# Patient Record
Sex: Male | Born: 1955 | Race: Black or African American | Hispanic: No | Marital: Married | Smoking: Former smoker
Health system: Southern US, Community
[De-identification: ages and names within clinical notes are randomized; demographics above are authoritative.]

## PROBLEM LIST (undated history)

## (undated) DIAGNOSIS — I10 Essential (primary) hypertension: Secondary | ICD-10-CM

## (undated) DIAGNOSIS — J45909 Unspecified asthma, uncomplicated: Secondary | ICD-10-CM

## (undated) DIAGNOSIS — M1990 Unspecified osteoarthritis, unspecified site: Secondary | ICD-10-CM

## (undated) DIAGNOSIS — E119 Type 2 diabetes mellitus without complications: Secondary | ICD-10-CM

## (undated) HISTORY — PX: NO PAST SURGERIES: SHX2092

---

## 2019-04-11 ENCOUNTER — Encounter
Admission: RE | Admit: 2019-04-11 | Discharge: 2019-04-11 | Disposition: A | Discharge status: Home / Self Care | Payer: BC Managed Care – PPO | Source: Ambulatory Visit | Attending: Orthopedic Surgery | Admitting: Orthopedic Surgery

## 2019-04-11 ENCOUNTER — Other Ambulatory Visit: Payer: Self-pay

## 2019-04-11 DIAGNOSIS — I10 Essential (primary) hypertension: Secondary | ICD-10-CM | POA: Insufficient documentation

## 2019-04-11 DIAGNOSIS — Z01818 Encounter for other preprocedural examination: Secondary | ICD-10-CM | POA: Insufficient documentation

## 2019-04-11 DIAGNOSIS — E119 Type 2 diabetes mellitus without complications: Secondary | ICD-10-CM | POA: Insufficient documentation

## 2019-04-11 HISTORY — DX: Type 2 diabetes mellitus without complications: E11.9

## 2019-04-11 HISTORY — DX: Unspecified asthma, uncomplicated: J45.909

## 2019-04-11 HISTORY — DX: Unspecified osteoarthritis, unspecified site: M19.90

## 2019-04-11 HISTORY — DX: Essential (primary) hypertension: I10

## 2019-04-11 LAB — BASIC METABOLIC PANEL
Anion gap: 9 (ref 5–15)
BUN: 21 mg/dL (ref 8–23)
CO2: 26 mmol/L (ref 22–32)
Calcium: 9.2 mg/dL (ref 8.9–10.3)
Chloride: 106 mmol/L (ref 98–111)
Creatinine, Ser: 1.03 mg/dL (ref 0.61–1.24)
GFR calc Af Amer: >60 mL/min (ref >60)
GFR calc non Af Amer: >60 mL/min (ref >60)
Glucose, Bld: 104 mg/dL — ABNORMAL HIGH (ref 70–99)
Potassium: 4.4 mmol/L (ref 3.5–5.1)
Sodium: 141 mmol/L (ref 135–145)

## 2019-04-11 LAB — URINALYSIS, ROUTINE W REFLEX MICROSCOPIC
Bilirubin Urine: NEGATIVE
Glucose, UA: NEGATIVE mg/dL
Hgb urine dipstick: NEGATIVE
Ketones, ur: NEGATIVE mg/dL
Leukocytes,Ua: NEGATIVE
Nitrite: NEGATIVE
Protein, ur: NEGATIVE mg/dL
Specific Gravity, Urine: 1.029 (ref 1.005–1.030)
pH: 5 (ref 5.0–8.0)

## 2019-04-11 LAB — SURGICAL PCR SCREEN
MRSA, PCR: NEGATIVE
Staphylococcus aureus: NEGATIVE

## 2019-04-11 LAB — CBC
HCT: 40.5 % (ref 39.0–52.0)
Hemoglobin: 13.5 g/dL (ref 13.0–17.0)
MCH: 28.7 pg (ref 26.0–34.0)
MCHC: 33.3 g/dL (ref 30.0–36.0)
MCV: 86.2 fL (ref 80.0–100.0)
Platelets: 175 10*3/uL (ref 150–400)
RBC: 4.7 MIL/uL (ref 4.22–5.81)
RDW: 12.4 % (ref 11.5–15.5)
WBC: 5.8 10*3/uL (ref 4.0–10.5)

## 2019-04-11 LAB — APTT: aPTT: 26 seconds (ref 24–36)

## 2019-04-11 LAB — SEDIMENTATION RATE: Sed Rate: 15 mm/hr (ref 0–20)

## 2019-04-11 LAB — TYPE AND SCREEN
ABO/RH(D): O POS
Antibody Screen: NEGATIVE

## 2019-04-11 LAB — PROTIME-INR
INR: 1 (ref 0.8–1.2)
Prothrombin Time: 12.9 seconds (ref 11.4–15.2)

## 2019-04-11 NOTE — Pre-Procedure Instructions (Signed)
ECG 12-lead2/20/2019 [place] Component Name Value Ref Range  Vent Rate (bpm) 103   PR Interval (msec) 150   QRS Interval (msec) 88   QT Interval (msec) 340   QTc (msec) 445   Other Result Information  This result has an attachment that is not available.  Result Narrative  Sinus tachycardia Possible Left atrial enlargement Anterior infarct , age undetermined Abnormal ECG No previous ECGs available I reviewed and concur with this report. Electronically signed [id number] MD, JASMINE [phone]) on 09/10/2017 6:55:20 PM  Status Results Details   Encounter Summary

## 2019-04-11 NOTE — Patient Instructions (Signed)
Your procedure is scheduled on: 04-19-19 TUESDAY Report to Same Day Surgery 2nd floor medical mall [facility] Entrance-take elevator on left to 2nd floor.  Check in with surgery information desk.) To find out your arrival time please call [phone] between 1PM - 3PM on 04-18-19 MONDAY  Remember: Instructions that are not followed completely may result in serious medical risk, up to and including death, or upon the discretion of your surgeon and anesthesiologist your surgery may need to be rescheduled.    _x___ 1. Do not eat food after midnight the night before your procedure. NO GUM OR CANDY AFTER MIDNIGHT. You may drink WATER up to 2 hours before you are scheduled to arrive at the hospital for your procedure.  Do not drink WATER within 2 hours of your scheduled arrival to the hospital.  Type 1 and type 2 diabetics should only drink water.   ____Ensure clear carbohydrate drink on the way to the hospital for bariatric patients  ____Ensure clear carbohydrate drink 3 hours before surgery.     __x__ 2. No Alcohol for 24 hours before or after surgery.   __x__3. No Smoking or e-cigarettes for 24 prior to surgery.  Do not use any chewable tobacco products for at least 6 hour prior to surgery   ____  4. Bring all medications with you on the day of surgery if instructed.    __x__ 5. Notify your doctor if there is any change in your medical condition     (cold, fever, infections).    x___6. On the morning of surgery brush your teeth with toothpaste and water.  You may rinse your mouth with mouth wash if you wish.  Do not swallow any toothpaste or mouthwash.   Do not wear jewelry, make-up, hairpins, clips or nail polish.  Do not wear lotions, powders, or perfumes.   Do not shave 48 hours prior to surgery. Men may shave face and neck.  Do not bring valuables to the hospital.    [facility] is not responsible for any belongings or valuables.               Contacts, dentures or bridgework  may not be worn into surgery.  Leave your suitcase in the car. After surgery it may be brought to your room.  For patients admitted to the hospital, discharge time is determined by your treatment team.  _  Patients discharged the day of surgery will not be allowed to drive home.  You will need someone to drive you home and stay with you the night of your procedure.    Please read over the following fact sheets that you were given:   [facility] Preparing for Surgery and or MRSA Information   _x___ TAKE THE FOLLOWING MEDICATION THE MORNING OF SURGERY WITH A SMALL SIP OF WATER. These include:  1. NORVASC (AMLODIPINE)  2. METOPROLOL  3.  4.  5.  6.  ____Fleets enema or Magnesium Citrate as directed.   _x___ Use CHG Soap or sage wipes as directed on instruction sheet   ____ Use inhalers on the day of surgery and bring to hospital day of surgery  _X___ Stop Metformin 2 days prior to surgery-LAST DOSE ON Saturday OCTOBER 3RD   ____ Take 1/2 of usual insulin dose the night before surgery and none on the morning surgery.   ____ Follow recommendations from Cardiologist, Pulmonologist or PCP regarding stopping Aspirin, Coumadin, Plavix ,Eliquis, Effient, or Pradaxa, and Pletal.  X____Stop Anti-inflammatories such as  Advil, Aleve, Ibuprofen, Motrin, Naproxen, Naprosyn, Goodies powders or aspirin products NOW-OK to take Tylenol    ____ Stop supplements until after surgery   ____ Bring C-Pap to the hospital.

## 2019-04-12 LAB — URINE CULTURE: Culture: NO GROWTH

## 2019-04-15 ENCOUNTER — Other Ambulatory Visit
Admission: RE | Admit: 2019-04-15 | Discharge: 2019-04-15 | Disposition: A | Discharge status: Home / Self Care | Payer: BC Managed Care – PPO | Source: Ambulatory Visit | Attending: Orthopedic Surgery | Admitting: Orthopedic Surgery

## 2019-04-15 ENCOUNTER — Other Ambulatory Visit: Payer: Self-pay

## 2019-04-15 DIAGNOSIS — Z01812 Encounter for preprocedural laboratory examination: Secondary | ICD-10-CM | POA: Diagnosis not present

## 2019-04-15 DIAGNOSIS — Z20828 Contact with and (suspected) exposure to other viral communicable diseases: Secondary | ICD-10-CM | POA: Insufficient documentation

## 2019-04-15 LAB — SARS CORONAVIRUS 2 (TAT 6-24 HRS): SARS Coronavirus 2: NEGATIVE

## 2019-04-19 ENCOUNTER — Inpatient Hospital Stay: Payer: BC Managed Care – PPO | Admitting: Certified Registered"

## 2019-04-19 ENCOUNTER — Other Ambulatory Visit: Payer: Self-pay

## 2019-04-19 ENCOUNTER — Inpatient Hospital Stay
Admission: RE | Admit: 2019-04-19 | Discharge: 2019-04-20 | DRG: 470 | Disposition: A | Discharge status: Home / Self Care | Payer: BC Managed Care – PPO | Attending: Orthopedic Surgery | Admitting: Orthopedic Surgery

## 2019-04-19 ENCOUNTER — Inpatient Hospital Stay: Payer: BC Managed Care – PPO

## 2019-04-19 ENCOUNTER — Encounter
Admission: RE | Disposition: A | Discharge status: Home / Self Care | Payer: Self-pay | Source: Home / Self Care | Attending: Orthopedic Surgery

## 2019-04-19 DIAGNOSIS — Z8249 Family history of ischemic heart disease and other diseases of the circulatory system: Secondary | ICD-10-CM

## 2019-04-19 DIAGNOSIS — Z7984 Long term (current) use of oral hypoglycemic drugs: Secondary | ICD-10-CM

## 2019-04-19 DIAGNOSIS — I10 Essential (primary) hypertension: Secondary | ICD-10-CM | POA: Diagnosis present

## 2019-04-19 DIAGNOSIS — Z419 Encounter for procedure for purposes other than remedying health state, unspecified: Secondary | ICD-10-CM

## 2019-04-19 DIAGNOSIS — K08109 Complete loss of teeth, unspecified cause, unspecified class: Secondary | ICD-10-CM | POA: Diagnosis present

## 2019-04-19 DIAGNOSIS — Z972 Presence of dental prosthetic device (complete) (partial): Secondary | ICD-10-CM

## 2019-04-19 DIAGNOSIS — Z833 Family history of diabetes mellitus: Secondary | ICD-10-CM | POA: Diagnosis not present

## 2019-04-19 DIAGNOSIS — E119 Type 2 diabetes mellitus without complications: Secondary | ICD-10-CM | POA: Diagnosis present

## 2019-04-19 DIAGNOSIS — M1612 Unilateral primary osteoarthritis, left hip: Principal | ICD-10-CM | POA: Diagnosis present

## 2019-04-19 DIAGNOSIS — M1990 Unspecified osteoarthritis, unspecified site: Secondary | ICD-10-CM | POA: Diagnosis present

## 2019-04-19 DIAGNOSIS — Z79899 Other long term (current) drug therapy: Secondary | ICD-10-CM

## 2019-04-19 DIAGNOSIS — Z91048 Other nonmedicinal substance allergy status: Secondary | ICD-10-CM

## 2019-04-19 DIAGNOSIS — Z91030 Bee allergy status: Secondary | ICD-10-CM

## 2019-04-19 DIAGNOSIS — Z96642 Presence of left artificial hip joint: Secondary | ICD-10-CM

## 2019-04-19 DIAGNOSIS — G8918 Other acute postprocedural pain: Secondary | ICD-10-CM

## 2019-04-19 HISTORY — PX: TOTAL HIP ARTHROPLASTY: SHX124

## 2019-04-19 LAB — CBC
HCT: 38.3 % — ABNORMAL LOW (ref 39.0–52.0)
Hemoglobin: 12.7 g/dL — ABNORMAL LOW (ref 13.0–17.0)
MCH: 28.5 pg (ref 26.0–34.0)
MCHC: 33.2 g/dL (ref 30.0–36.0)
MCV: 86.1 fL (ref 80.0–100.0)
Platelets: 161 10*3/uL (ref 150–400)
RBC: 4.45 MIL/uL (ref 4.22–5.81)
RDW: 12.6 % (ref 11.5–15.5)
WBC: 4.7 10*3/uL (ref 4.0–10.5)
nRBC: 0 % (ref 0.0–0.2)

## 2019-04-19 LAB — CREATININE, SERUM
Creatinine, Ser: 0.85 mg/dL (ref 0.61–1.24)
GFR calc Af Amer: >60 mL/min (ref >60)
GFR calc non Af Amer: >60 mL/min (ref >60)

## 2019-04-19 LAB — GLUCOSE, CAPILLARY
Glucose-Capillary: 121 mg/dL — ABNORMAL HIGH (ref 70–99)
Glucose-Capillary: 160 mg/dL — ABNORMAL HIGH (ref 70–99)
Glucose-Capillary: 129 mg/dL — ABNORMAL HIGH (ref 70–99)
Glucose-Capillary: 171 mg/dL — ABNORMAL HIGH (ref 70–99)
Glucose-Capillary: 160 mg/dL — ABNORMAL HIGH (ref 70–99)

## 2019-04-19 LAB — HEMOGLOBIN A1C
Hgb A1c MFr Bld: 6.8 % — ABNORMAL HIGH (ref 4.8–5.6)
Mean Plasma Glucose: 148.46 mg/dL

## 2019-04-19 LAB — ABO/RH: ABO/RH(D): O POS

## 2019-04-19 SURGERY — ARTHROPLASTY, HIP, TOTAL, ANTERIOR APPROACH
Anesthesia: Spinal | Site: Hip | Laterality: Left

## 2019-04-19 MED ORDER — GLIMEPIRIDE 4 MG PO TABS
4.0000 mg | ORAL_TABLET | Freq: Two times a day (BID) | ORAL | Status: DC
  Administered 2019-04-19 – 2019-04-20 (×2): 4 mg via ORAL
  Filled 2019-04-19 (×3): qty 1

## 2019-04-19 MED ORDER — BUPIVACAINE HCL (PF) 0.5 % IJ SOLN
INTRAMUSCULAR | Status: AC
  Filled 2019-04-19: qty 10

## 2019-04-19 MED ORDER — SODIUM CHLORIDE 0.9 % IJ SOLN
INTRAMUSCULAR | Status: DC | PRN
  Administered 2019-04-19: 40 mL

## 2019-04-19 MED ORDER — METHOCARBAMOL 500 MG PO TABS: 500.0000 mg | ORAL_TABLET | Freq: Four times a day (QID) | ORAL | Status: DC | PRN

## 2019-04-19 MED ORDER — ONDANSETRON HCL 4 MG PO TABS: 4.0000 mg | ORAL_TABLET | Freq: Four times a day (QID) | ORAL | Status: DC | PRN

## 2019-04-19 MED ORDER — SODIUM CHLORIDE FLUSH 0.9 % IV SOLN
INTRAVENOUS | Status: AC
  Filled 2019-04-19: qty 40

## 2019-04-19 MED ORDER — PROPOFOL 500 MG/50ML IV EMUL
INTRAVENOUS | Status: DC | PRN
  Administered 2019-04-19: 50 ug/kg/min via INTRAVENOUS

## 2019-04-19 MED ORDER — METFORMIN HCL 500 MG PO TABS
1000.0000 mg | ORAL_TABLET | Freq: Two times a day (BID) | ORAL | Status: DC
  Administered 2019-04-19 – 2019-04-20 (×2): 1000 mg via ORAL
  Filled 2019-04-19 (×2): qty 2

## 2019-04-19 MED ORDER — PANTOPRAZOLE SODIUM 40 MG PO TBEC
40.0000 mg | DELAYED_RELEASE_TABLET | Freq: Every day | ORAL | Status: DC
  Administered 2019-04-19 – 2019-04-20 (×2): 40 mg via ORAL
  Filled 2019-04-19 (×2): qty 1

## 2019-04-19 MED ORDER — MAGNESIUM CITRATE PO SOLN
1.0000 | Freq: Once | ORAL | Status: DC | PRN
  Filled 2019-04-19: qty 296

## 2019-04-19 MED ORDER — ALUM & MAG HYDROXIDE-SIMETH 200-200-20 MG/5ML PO SUSP: 30.0000 mL | ORAL | Status: DC | PRN

## 2019-04-19 MED ORDER — FENTANYL CITRATE (PF) 100 MCG/2ML IJ SOLN: 25.0000 ug | INTRAMUSCULAR | Status: DC | PRN

## 2019-04-19 MED ORDER — INSULIN ASPART 100 UNIT/ML ~~LOC~~ SOLN
0.0000 [IU] | Freq: Three times a day (TID) | SUBCUTANEOUS | Status: DC
  Administered 2019-04-19: 2 [IU] via SUBCUTANEOUS
  Administered 2019-04-19 – 2019-04-20 (×3): 3 [IU] via SUBCUTANEOUS
  Filled 2019-04-19 (×4): qty 1

## 2019-04-19 MED ORDER — BUPIVACAINE-EPINEPHRINE 0.25% -1:200000 IJ SOLN
INTRAMUSCULAR | Status: DC | PRN
  Administered 2019-04-19: 30 mL

## 2019-04-19 MED ORDER — SODIUM CHLORIDE 0.9 % IV SOLN
INTRAVENOUS | Status: DC | PRN
  Administered 2019-04-19: 10:00:00 60 mL

## 2019-04-19 MED ORDER — LIDOCAINE HCL (PF) 2 % IJ SOLN
INTRAMUSCULAR | Status: DC | PRN
  Administered 2019-04-19: 50 mg

## 2019-04-19 MED ORDER — METOPROLOL SUCCINATE ER 25 MG PO TB24
25.0000 mg | ORAL_TABLET | ORAL | Status: DC
  Administered 2019-04-19 – 2019-04-20 (×2): 25 mg via ORAL
  Filled 2019-04-19 (×2): qty 1

## 2019-04-19 MED ORDER — LISINOPRIL 20 MG PO TABS
40.0000 mg | ORAL_TABLET | ORAL | Status: DC
  Administered 2019-04-19 – 2019-04-20 (×2): 40 mg via ORAL
  Filled 2019-04-19 (×2): qty 2

## 2019-04-19 MED ORDER — GLYCOPYRROLATE 0.2 MG/ML IJ SOLN
INTRAMUSCULAR | Status: AC
  Filled 2019-04-19: qty 1

## 2019-04-19 MED ORDER — DOCUSATE SODIUM 100 MG PO CAPS
100.0000 mg | ORAL_CAPSULE | Freq: Two times a day (BID) | ORAL | Status: DC
  Administered 2019-04-19 – 2019-04-20 (×3): 100 mg via ORAL
  Filled 2019-04-19 (×3): qty 1

## 2019-04-19 MED ORDER — CEFAZOLIN SODIUM-DEXTROSE 2-4 GM/100ML-% IV SOLN
2.0000 g | Freq: Once | INTRAVENOUS | Status: AC
  Administered 2019-04-19: 2 g via INTRAVENOUS

## 2019-04-19 MED ORDER — ONDANSETRON HCL 4 MG/2ML IJ SOLN: 4.0000 mg | Freq: Four times a day (QID) | INTRAMUSCULAR | Status: DC | PRN

## 2019-04-19 MED ORDER — FAMOTIDINE 20 MG PO TABS
ORAL_TABLET | ORAL | Status: AC
  Administered 2019-04-19: 20 mg via ORAL
  Filled 2019-04-19: qty 1

## 2019-04-19 MED ORDER — MIDAZOLAM HCL 5 MG/5ML IJ SOLN
INTRAMUSCULAR | Status: DC | PRN
  Administered 2019-04-19: 2 mg via INTRAVENOUS

## 2019-04-19 MED ORDER — MENTHOL 3 MG MT LOZG
1.0000 | LOZENGE | OROMUCOSAL | Status: DC | PRN
  Filled 2019-04-19: qty 9

## 2019-04-19 MED ORDER — AMLODIPINE BESYLATE 10 MG PO TABS
10.0000 mg | ORAL_TABLET | ORAL | Status: DC
  Administered 2019-04-19 – 2019-04-20 (×2): 10 mg via ORAL
  Filled 2019-04-19 (×2): qty 1

## 2019-04-19 MED ORDER — FENTANYL CITRATE (PF) 100 MCG/2ML IJ SOLN
INTRAMUSCULAR | Status: DC | PRN
  Administered 2019-04-19 (×2): 50 ug via INTRAVENOUS

## 2019-04-19 MED ORDER — MORPHINE SULFATE (PF) 2 MG/ML IV SOLN
0.5000 mg | INTRAVENOUS | Status: DC | PRN
  Administered 2019-04-19: 1 mg via INTRAVENOUS
  Filled 2019-04-19: qty 1

## 2019-04-19 MED ORDER — DIPHENHYDRAMINE HCL 12.5 MG/5ML PO ELIX: 12.5000 mg | ORAL_SOLUTION | ORAL | Status: DC | PRN

## 2019-04-19 MED ORDER — HYDROCODONE-ACETAMINOPHEN 7.5-325 MG PO TABS
1.0000 | ORAL_TABLET | ORAL | Status: DC | PRN
  Administered 2019-04-19 – 2019-04-20 (×2): 1 via ORAL
  Filled 2019-04-19 (×2): qty 1

## 2019-04-19 MED ORDER — PROPOFOL 10 MG/ML IV BOLUS
INTRAVENOUS | Status: AC
  Filled 2019-04-19: qty 20

## 2019-04-19 MED ORDER — FENTANYL CITRATE (PF) 100 MCG/2ML IJ SOLN
INTRAMUSCULAR | Status: AC
  Filled 2019-04-19: qty 2

## 2019-04-19 MED ORDER — BUPIVACAINE LIPOSOME 1.3 % IJ SUSP
INTRAMUSCULAR | Status: AC
  Filled 2019-04-19: qty 20

## 2019-04-19 MED ORDER — TRANEXAMIC ACID-NACL 1000-0.7 MG/100ML-% IV SOLN
1000.0000 mg | INTRAVENOUS | Status: AC
  Administered 2019-04-19: 1000 mg via INTRAVENOUS
  Filled 2019-04-19: qty 100

## 2019-04-19 MED ORDER — ENOXAPARIN SODIUM 40 MG/0.4ML ~~LOC~~ SOLN
40.0000 mg | SUBCUTANEOUS | Status: DC
  Administered 2019-04-20: 40 mg via SUBCUTANEOUS
  Filled 2019-04-19: qty 0.4

## 2019-04-19 MED ORDER — BUPIVACAINE HCL (PF) 0.25 % IJ SOLN
INTRAMUSCULAR | Status: AC
  Filled 2019-04-19: qty 30

## 2019-04-19 MED ORDER — ACETAMINOPHEN 325 MG PO TABS: 325.0000 mg | ORAL_TABLET | Freq: Four times a day (QID) | ORAL | Status: DC | PRN

## 2019-04-19 MED ORDER — ADULT MULTIVITAMIN W/MINERALS CH
1.0000 | ORAL_TABLET | Freq: Every day | ORAL | Status: DC
  Administered 2019-04-19 – 2019-04-20 (×2): 1 via ORAL
  Filled 2019-04-19 (×2): qty 1

## 2019-04-19 MED ORDER — LIDOCAINE HCL (PF) 2 % IJ SOLN
INTRAMUSCULAR | Status: AC
  Filled 2019-04-19: qty 10

## 2019-04-19 MED ORDER — METHOCARBAMOL 1000 MG/10ML IJ SOLN
500.0000 mg | Freq: Four times a day (QID) | INTRAVENOUS | Status: DC | PRN
  Filled 2019-04-19: qty 5

## 2019-04-19 MED ORDER — BUPIVACAINE HCL (PF) 0.5 % IJ SOLN
INTRAMUSCULAR | Status: DC | PRN
  Administered 2019-04-19: 3 mL via INTRATHECAL

## 2019-04-19 MED ORDER — MIDAZOLAM HCL 2 MG/2ML IJ SOLN
INTRAMUSCULAR | Status: AC
  Filled 2019-04-19: qty 2

## 2019-04-19 MED ORDER — SODIUM CHLORIDE 0.9 % IV SOLN
INTRAVENOUS | Status: DC
  Administered 2019-04-19 (×2): via INTRAVENOUS

## 2019-04-19 MED ORDER — GLYCOPYRROLATE 0.2 MG/ML IJ SOLN
INTRAMUSCULAR | Status: DC | PRN
  Administered 2019-04-19: 0.2 mg via INTRAVENOUS

## 2019-04-19 MED ORDER — EPHEDRINE SULFATE 50 MG/ML IJ SOLN
INTRAMUSCULAR | Status: DC | PRN
  Administered 2019-04-19: 10 mg via INTRAVENOUS

## 2019-04-19 MED ORDER — EPINEPHRINE PF 1 MG/ML IJ SOLN
INTRAMUSCULAR | Status: AC
  Filled 2019-04-19: qty 1

## 2019-04-19 MED ORDER — TRAMADOL HCL 50 MG PO TABS
50.0000 mg | ORAL_TABLET | Freq: Four times a day (QID) | ORAL | Status: DC
  Administered 2019-04-19 – 2019-04-20 (×5): 50 mg via ORAL
  Filled 2019-04-19 (×5): qty 1

## 2019-04-19 MED ORDER — PROPOFOL 10 MG/ML IV BOLUS
INTRAVENOUS | Status: DC | PRN
  Administered 2019-04-19: 20 mg via INTRAVENOUS

## 2019-04-19 MED ORDER — BISACODYL 10 MG RE SUPP: 10.0000 mg | Freq: Every day | RECTAL | Status: DC | PRN

## 2019-04-19 MED ORDER — CEFAZOLIN SODIUM-DEXTROSE 2-4 GM/100ML-% IV SOLN
INTRAVENOUS | Status: AC
  Filled 2019-04-19: qty 100

## 2019-04-19 MED ORDER — PHENYLEPHRINE HCL (PRESSORS) 10 MG/ML IV SOLN
INTRAVENOUS | Status: DC | PRN
  Administered 2019-04-19 (×3): 100 ug via INTRAVENOUS
  Administered 2019-04-19: 200 ug via INTRAVENOUS
  Administered 2019-04-19 (×2): 100 ug via INTRAVENOUS

## 2019-04-19 MED ORDER — CEFAZOLIN SODIUM-DEXTROSE 2-4 GM/100ML-% IV SOLN
2.0000 g | Freq: Four times a day (QID) | INTRAVENOUS | Status: AC
  Administered 2019-04-19 – 2019-04-20 (×3): 2 g via INTRAVENOUS
  Filled 2019-04-19 (×3): qty 100

## 2019-04-19 MED ORDER — HYDROCODONE-ACETAMINOPHEN 5-325 MG PO TABS
1.0000 | ORAL_TABLET | ORAL | Status: DC | PRN
  Administered 2019-04-19: 1 via ORAL
  Filled 2019-04-19: qty 1

## 2019-04-19 MED ORDER — METOCLOPRAMIDE HCL 5 MG/ML IJ SOLN: 5.0000 mg | Freq: Three times a day (TID) | INTRAMUSCULAR | Status: DC | PRN

## 2019-04-19 MED ORDER — PHENOL 1.4 % MT LIQD
1.0000 | OROMUCOSAL | Status: DC | PRN
  Filled 2019-04-19: qty 177

## 2019-04-19 MED ORDER — MAGNESIUM HYDROXIDE 400 MG/5ML PO SUSP
30.0000 mL | Freq: Every day | ORAL | Status: DC | PRN
  Administered 2019-04-20: 30 mL via ORAL
  Filled 2019-04-19: qty 30

## 2019-04-19 MED ORDER — NEOMYCIN-POLYMYXIN B GU 40-200000 IR SOLN
Status: DC | PRN
  Administered 2019-04-19: 4 mL

## 2019-04-19 MED ORDER — METOCLOPRAMIDE HCL 10 MG PO TABS: 5.0000 mg | ORAL_TABLET | Freq: Three times a day (TID) | ORAL | Status: DC | PRN

## 2019-04-19 MED ORDER — FAMOTIDINE 20 MG PO TABS
20.0000 mg | ORAL_TABLET | Freq: Once | ORAL | Status: AC
  Administered 2019-04-19: 08:00:00 20 mg via ORAL

## 2019-04-19 MED ORDER — ZOLPIDEM TARTRATE 5 MG PO TABS: 5.0000 mg | ORAL_TABLET | Freq: Every evening | ORAL | Status: DC | PRN

## 2019-04-19 MED ORDER — ONDANSETRON HCL 4 MG/2ML IJ SOLN: 4.0000 mg | Freq: Once | INTRAMUSCULAR | Status: DC | PRN

## 2019-04-19 MED ORDER — SODIUM CHLORIDE 0.9 % IV SOLN
INTRAVENOUS | Status: DC
  Administered 2019-04-19: 09:00:00 via INTRAVENOUS

## 2019-04-19 SURGICAL SUPPLY — 60 items
BLADE SAGITTAL AGGR TOOTH XLG (BLADE) ×3 IMPLANT
BNDG COHESIVE 6X5 TAN STRL LF (GAUZE/BANDAGES/DRESSINGS) ×9 IMPLANT
CANISTER SUCT 1200ML W/VALVE (MISCELLANEOUS) ×3 IMPLANT
CANISTER WOUND CARE 500ML ATS (WOUND CARE) ×3 IMPLANT
CHLORAPREP W/TINT 26 (MISCELLANEOUS) ×3 IMPLANT
COVER BACK TABLE REUSABLE LG (DRAPES) ×3 IMPLANT
COVER WAND RF STERILE (DRAPES) ×3 IMPLANT
DRAPE 3/4 80X56 (DRAPES) ×9 IMPLANT
DRAPE C-ARM XRAY 36X54 (DRAPES) ×3 IMPLANT
DRAPE INCISE IOBAN 66X60 STRL (DRAPES) IMPLANT
DRAPE POUCH INSTRU U-SHP 10X18 (DRAPES) ×3 IMPLANT
DRESSING SURGICEL FIBRLLR 1X2 (HEMOSTASIS) ×2 IMPLANT
DRSG OPSITE POSTOP 4X8 (GAUZE/BANDAGES/DRESSINGS) ×6 IMPLANT
DRSG SURGICEL FIBRILLAR 1X2 (HEMOSTASIS) ×6
ELECT BLADE 6.5 EXT (BLADE) ×3 IMPLANT
ELECT REM PT RETURN 9FT ADLT (ELECTROSURGICAL) ×3
ELECTRODE REM PT RTRN 9FT ADLT (ELECTROSURGICAL) ×1 IMPLANT
GLOVE BIOGEL PI IND STRL 9 (GLOVE) ×1 IMPLANT
GLOVE BIOGEL PI INDICATOR 9 (GLOVE) ×2
GLOVE SURG SYN 9.0  PF PI (GLOVE) ×4
GLOVE SURG SYN 9.0 PF PI (GLOVE) ×2 IMPLANT
GOWN SRG 2XL LVL 4 RGLN SLV (GOWNS) ×1 IMPLANT
GOWN STRL NON-REIN 2XL LVL4 (GOWNS) ×2
GOWN STRL REUS W/ TWL LRG LVL3 (GOWN DISPOSABLE) ×1 IMPLANT
GOWN STRL REUS W/TWL LRG LVL3 (GOWN DISPOSABLE) ×2
HEMOVAC 400CC 10FR (MISCELLANEOUS) IMPLANT
HIP DBL LINER 54X28 (Liner) ×2 IMPLANT
HIP FEM HD L 28 (Head) ×2 IMPLANT
HOLDER FOLEY CATH W/STRAP (MISCELLANEOUS) ×3 IMPLANT
HOOD PEEL AWAY FLYTE STAYCOOL (MISCELLANEOUS) ×3 IMPLANT
KIT PREVENA INCISION MGT 13 (CANNISTER) ×3 IMPLANT
MAT ABSORB  FLUID 56X50 GRAY (MISCELLANEOUS) ×2
MAT ABSORB FLUID 56X50 GRAY (MISCELLANEOUS) ×1 IMPLANT
NDL SAFETY ECLIPSE 18X1.5 (NEEDLE) ×1 IMPLANT
NDL SPNL 20GX3.5 QUINCKE YW (NEEDLE) ×2 IMPLANT
NEEDLE HYPO 18GX1.5 SHARP (NEEDLE) ×2
NEEDLE SPNL 20GX3.5 QUINCKE YW (NEEDLE) ×6 IMPLANT
NS IRRIG 1000ML POUR BTL (IV SOLUTION) ×3 IMPLANT
PACK HIP COMPR (MISCELLANEOUS) ×3 IMPLANT
SCALPEL PROTECTED #10 DISP (BLADE) ×6 IMPLANT
SHELL ACETABULAR SZ 54 DM (Shell) ×2 IMPLANT
SOL PREP PVP 2OZ (MISCELLANEOUS) ×3
SOLUTION PREP PVP 2OZ (MISCELLANEOUS) ×1 IMPLANT
SPONGE DRAIN TRACH 4X4 STRL 2S (GAUZE/BANDAGES/DRESSINGS) ×3 IMPLANT
STAPLER SKIN PROX 35W (STAPLE) ×3 IMPLANT
STEM FEMORAL SZ LAT COLLARED (Stem) ×2 IMPLANT
STRAP SAFETY 5IN WIDE (MISCELLANEOUS) ×3 IMPLANT
SUT DVC 2 QUILL PDO  T11 36X36 (SUTURE) ×2
SUT DVC 2 QUILL PDO T11 36X36 (SUTURE) ×1 IMPLANT
SUT SILK 0 (SUTURE) ×2
SUT SILK 0 30XBRD TIE 6 (SUTURE) ×1 IMPLANT
SUT V-LOC 90 ABS DVC 3-0 CL (SUTURE) ×3 IMPLANT
SUT VIC AB 1 CT1 36 (SUTURE) ×3 IMPLANT
SYR 20ML LL LF (SYRINGE) ×3 IMPLANT
SYR 30ML LL (SYRINGE) ×3 IMPLANT
SYR 50ML LL SCALE MARK (SYRINGE) ×6 IMPLANT
SYR BULB IRRIG 60ML STRL (SYRINGE) ×3 IMPLANT
TAPE MICROFOAM 4IN (TAPE) ×3 IMPLANT
TOWEL OR 17X26 4PK STRL BLUE (TOWEL DISPOSABLE) ×3 IMPLANT
TRAY FOLEY MTR SLVR 16FR STAT (SET/KITS/TRAYS/PACK) ×3 IMPLANT

## 2019-04-19 NOTE — Anesthesia Procedure Notes (Signed)
Spinal  Patient location during procedure: OR Staffing Anesthesiologist: Karenz, Andrew, MD Resident/CRNA: Kenyanna Grzesiak, CRNA Performed: resident/CRNA  Preanesthetic Checklist Completed: patient identified, site marked, surgical consent, pre-op evaluation, timeout performed, IV checked, risks and benefits discussed and monitors and equipment checked Spinal Block Patient position: sitting Prep: ChloraPrep and site prepped and draped Patient monitoring: heart rate, continuous pulse ox, blood pressure and cardiac monitor Approach: midline Location: L4-5 Injection technique: single-shot Needle Needle type: Introducer and Pencan  Needle gauge: 24 G Needle length: 9 cm Additional Notes Negative paresthesia. Negative blood return. Positive free-flowing CSF. Expiration date of kit checked and confirmed. Patient tolerated procedure well, without complications.       

## 2019-04-19 NOTE — H&P (Signed)
Reviewed paper H+P, will be scanned into chart. No changes noted.  

## 2019-04-19 NOTE — Progress Notes (Signed)
15 minute call to floor. 

## 2019-04-19 NOTE — Transfer of Care (Signed)
Immediate Anesthesia Transfer of Care Note  Patient: Donald Stephenson.  Procedure(s) Performed: TOTAL HIP ARTHROPLASTY ANTERIOR APPROACH (Left Hip)  Patient Location: PACU  Anesthesia Type:General  Level of Consciousness: awake and alert   Airway & Oxygen Therapy: Patient Spontanous Breathing and Patient connected to nasal cannula oxygen  Post-op Assessment: stable  Post vital signs: Reviewed  Last Vitals:  Vitals Value Taken Time  BP 109/70 04/19/19 1034  Temp    Pulse 64 04/19/19 1034  Resp 11 04/19/19 1034  SpO2 100 % 04/19/19 1034  Vitals shown include unvalidated device data.  Last Pain:  Vitals:   04/19/19 0803  TempSrc: Tympanic  PainSc: 0-No pain         Complications: No apparent anesthesia complications

## 2019-04-19 NOTE — TOC Benefit Eligibility Note (Signed)
Transition of Care [facility]) Benefit Eligibility Note    Patient Details  Name: Donald Stephenson. MRN: [id number] Date of Birth: 1955/08/11   Medication/Dose: Enoxaparin 40mg  once daily for 14 days  Covered?: Yes  Prescription Coverage [facility]  Spoke with Person/Company/Phone Number:: Timmy with [employer] at [phone]  Co-Pay: $15 estimated copay  Prior Approval: No  Deductible: (No deductible per rep.)   Dannette Barbara Phone Number: [phone] or [phone] 04/19/2019, 2:25 PM

## 2019-04-19 NOTE — Plan of Care (Signed)

## 2019-04-19 NOTE — Evaluation (Signed)
Physical Therapy Evaluation Patient Details Name: Donald Stephenson. MRN: [id number] DOB: [birth year] Today's Date: 04/19/2019   History of Present Illness  pt is a 63 yo male admitted s/p L THA on 04/19/19. PMH includes HTN, DM, arthritis  Clinical Impression  Pt is a 63 yo male admitted s/p L THA. Pt in bed upon arrival with spouse present and agreed to participate with PT. Pt presents with decreased strength, ROM, balance and activity tolerance consistent with post op status. Pt performed supine and seated therex requiring vc/tc for correct performance. Pt educated on performing HEP a couple times throughout the day. Pt educated on precautions with handout issued for precautions and HEP with pt verbalizing understanding. Pt performed bed mobility with min A. Further OOB mobility withheld at this time secondary to pt still reporting numbness/decreased sensation from surgery. Pt would benefit from skilled acute therapy to improve deficits noted. Pt would benefit from home health PT following hospital discharge to further improve deficits to allow for return to PLOF.     Follow Up Recommendations Home health PT    Equipment Recommendations  Rolling walker with 5" wheels;3in1 (PT)    Recommendations for Other Services       Precautions / Restrictions Precautions Precautions: Fall;Anterior Hip Precaution Booklet Issued: Yes (comment) Restrictions Weight Bearing Restrictions: Yes LLE Weight Bearing: Weight bearing as tolerated      Mobility  Bed Mobility Overal bed mobility: Needs Assistance Bed Mobility: Supine to Sit     Supine to sit: Min assist;HOB elevated     General bed mobility comments: min A for LE advancement, increased time to get EOB, pt utilized bed rails  Transfers                 General transfer comment: unable at this time secondary to pt not having full sensation back from surgery  Ambulation/Gait             General Gait Details: unable at this  time secondary to pt not having full sensation back after surgery  Stairs            Wheelchair Mobility    Modified Rankin (Stroke Patients Only)       Balance Overall balance assessment: Needs assistance Sitting-balance support: Feet supported;Single extremity supported Sitting balance-Leahy Scale: Good Sitting balance - Comments: pt steady sitting EOB for LE therex       Standing balance comment: unable at this time                             Pertinent Vitals/Pain Pain Assessment: No/denies pain    Home Living Family/patient expects to be discharged to:: Private residence Living Arrangements: Spouse/significant other Available Help at Discharge: Family;Available PRN/intermittently(wife works so only available PRN, daughter lives nearby) Type of Home: House Home Access: Stairs to enter Entrance Stairs-Rails: None Technical brewer of Steps: 2 in the back, 3 in the back Home Layout: One level Home Equipment: (may have a RW and BSC)      Prior Function Level of Independence: Independent         Comments: pt states being independent with all ADLs/IADLs, still working     Hand Dominance        Extremity/Trunk Assessment   Upper Extremity Assessment Upper Extremity Assessment: Overall WFL for tasks assessed    Lower Extremity Assessment Lower Extremity Assessment: Overall WFL for tasks assessed;LLE deficits/detail LLE Deficits / Details: LLE decreased strength,  ROM and balance consistent with recent THA    Cervical / Trunk Assessment Cervical / Trunk Assessment: Normal  Communication   Communication: No difficulties  Cognition Arousal/Alertness: Awake/alert Behavior During Therapy: WFL for tasks assessed/performed Overall Cognitive Status: Within Functional Limits for tasks assessed                                        General Comments      Exercises Total Joint Exercises Ankle Circles/Pumps:  AROM;Both;10 reps Quad Sets: AROM;Both;10 reps Gluteal Sets: AROM;Both;10 reps Towel Squeeze: AROM;Both;10 reps Hip ABduction/ADduction: AROM;Both;10 reps Straight Leg Raises: AROM;Both;10 reps Long Arc Quad: AROM;Both;10 reps Marching in Standing: AROM;Both;10 reps;Seated   Assessment/Plan    PT Assessment Patient needs continued PT services  PT Problem List Decreased strength;Decreased mobility;Decreased range of motion;Decreased balance;Decreased knowledge of use of DME;Decreased knowledge of precautions;Decreased activity tolerance       PT Treatment Interventions DME instruction;Therapeutic exercise;Gait training;Balance training;Stair training;Functional mobility training;Therapeutic activities;Patient/family education;Neuromuscular re-education    PT Goals (Current goals can be found in the Care Plan section)  Acute Rehab PT Goals Patient Stated Goal: return home and to work PT Goal Formulation: With patient Time For Goal Achievement: 05/03/19 Potential to Achieve Goals: Good    Frequency BID   Barriers to discharge Decreased caregiver support(no one at home 24/7)      Co-evaluation               AM-PAC PT "6 Clicks" Mobility  Outcome Measure Help needed turning from your back to your side while in a flat bed without using bedrails?: A Little Help needed moving from lying on your back to sitting on the side of a flat bed without using bedrails?: A Little Help needed moving to and from a bed to a chair (including a wheelchair)?: A Little Help needed standing up from a chair using your arms (e.g., wheelchair or bedside chair)?: A Little Help needed to walk in hospital room?: A Little Help needed climbing 3-5 steps with a railing? : A Lot 6 Click Score: 17    End of Session Equipment Utilized During Treatment: Gait belt Activity Tolerance: Patient tolerated treatment well Patient left: in bed;with call bell/phone within reach;with family/visitor present;with  SCD's reapplied;with nursing/sitter in room Nurse Communication: Mobility status PT Visit Diagnosis: Difficulty in walking, not elsewhere classified (R26.2);Other abnormalities of gait and mobility (R26.89)    Time: 0130-0202 PT Time Calculation (min) (ACUTE ONLY): 32 min   Charges:   PT Evaluation $PT Eval Low Complexity: 1 Low PT Treatments $Therapeutic Exercise: 8-22 mins       Jamile Rekowski PT, DPT 2:23 PM,04/19/19 [phone]

## 2019-04-19 NOTE — Anesthesia Post-op Follow-up Note (Signed)
Anesthesia QCDR form completed.        

## 2019-04-19 NOTE — Op Note (Signed)
04/19/2019  10:36 AM  PATIENT:  Donald Stephenson.  64 y.o. male  PRE-OPERATIVE DIAGNOSIS:  PRIMARY OSTEOARTHRITIS OF LEFT HIP  POST-OPERATIVE DIAGNOSIS:  PRIMARY OSTEOARTHRITIS OF LEFT HIP  PROCEDURE:  Procedure(s): TOTAL HIP ARTHROPLASTY ANTERIOR APPROACH (Left)  SURGEON: Laurene Footman, MD  ASSISTANTS: none  ANESTHESIA:   spinal  EBL:  Total I/O In: 900 [I.V.:900] Out: 350 [Urine:150; Blood:200]  BLOOD ADMINISTERED:none  DRAINS: none   LOCAL MEDICATIONS USED:  MARCAINE    and OTHER Exparel  SPECIMEN:  Source of Specimen:  Left femoral head  DISPOSITION OF SPECIMEN:  PATHOLOGY  COUNTS:  YES  TOURNIQUET:  * No tourniquets in log *  IMPLANTS: Medacta AMIS 1 lateralized stem, L metal 28 mm head, 54 mm Mpact TM cup and liner  DICTATION: .Dragon Dictation   The patient was brought to the operating room and after spinal anesthesia was obtained patient was placed on the operative table with the ipsilateral foot into the Medacta attachment, contralateral leg on a well-padded table. C-arm was brought in and preop template x-ray taken. After prepping and draping in usual sterile fashion appropriate patient identification and timeout procedures were completed. Anterior approach to the hip was obtained and centered over the greater trochanter and TFL muscle. The subcutaneous tissue was incised hemostasis being achieved by electrocautery. TFL fascia was incised and the muscle retracted laterally deep retractor placed. The lateral femoral circumflex vessels were identified and ligated. The anterior capsule was exposed and a capsulotomy performed. The neck was identified and a femoral neck cut carried out with a saw. The head was removed without difficulty and showed sclerotic femoral head and acetabulum. Reaming was carried out to 54 mm and a 54 mm cup trial gave appropriate tightness to the acetabular component a 54 DM cup was impacted into position. The leg was then externally rotated and  ischiofemoral and pubofemoral releases carried out. The femur was sequentially broached to a size 1, size 1 standard and then 1 lateralized with S and L heads trials were placed and the final components chosen. The one lateralized stem was inserted along with a metal L 28 mm head and 54 mm liner. The hip was reduced and was stable the wound was thoroughly irrigated with fibrillar placed along the posterior capsule and medial neck. The deep fascia ws closed using a heavy Quill after infiltration of 30 cc of quarter percent Sensorcaine along with Exparel throughout the case .3-0 V-loc to close the skin with skin staples.  Incisional wound VAC applied patient was sent to recovery in stable condition.   PLAN OF CARE: Admit to inpatient

## 2019-04-19 NOTE — TOC Progression Note (Signed)
Transition of Care (TOC) - Progression Note    Patient Details  Name: Donald Stephenson. MRN: [id number] Date of Birth: 1956-01-01  [facility]) CM/SW Contact  Su Hilt, RN Phone Number: 04/19/2019, 12:37 PM  Clinical Narrative:    requested the lovenox price        Expected Discharge Plan and Services                                                 Social Determinants of Health (SDOH) Interventions    Readmission Risk Interventions No flowsheet data found.

## 2019-04-19 NOTE — Anesthesia Preprocedure Evaluation (Signed)
Anesthesia Evaluation  Patient identified by MRN, date of birth, ID band Patient awake    Reviewed: Allergy & Precautions, H&P , NPO status , Patient's Chart, lab work & pertinent test results, reviewed documented beta blocker date and time   History of Anesthesia Complications Negative for: history of anesthetic complications  Airway Mallampati: I  TM Distance: >3 FB Neck ROM: full    Dental  (+) Upper Dentures, Lower Dentures, Edentulous Upper, Edentulous Lower   Pulmonary neg shortness of breath, asthma (as a child) , neg sleep apnea, neg COPD, neg recent URI, former smoker,    Pulmonary exam normal        Cardiovascular Exercise Tolerance: Good hypertension, (-) angina(-) Past MI and (-) Cardiac Stents Normal cardiovascular exam(-) dysrhythmias (-) Valvular Problems/Murmurs     Neuro/Psych negative neurological ROS  negative psych ROS   GI/Hepatic negative GI ROS, Neg liver ROS,   Endo/Other  diabetes  Renal/GU negative Renal ROS  negative genitourinary   Musculoskeletal   Abdominal   Peds  Hematology negative hematology ROS (+)   Anesthesia Other Findings Past Medical History: No date: Arthritis No date: Asthma     Comment:  as a child-no problems since childhood No date: Diabetes mellitus without complication (HCC) No date: Hypertension   Reproductive/Obstetrics negative OB ROS                             Anesthesia Physical Anesthesia Plan  ASA: II  Anesthesia Plan: Spinal   Post-op Pain Management:    Induction: Intravenous  PONV Risk Score and Plan: 1 and Propofol infusion and TIVA  Airway Management Planned: Natural Airway and Simple Face Mask  Additional Equipment:   Intra-op Plan:   Post-operative Plan:   Informed Consent: I have reviewed the patients History and Physical, chart, labs and discussed the procedure including the risks, benefits and alternatives  for the proposed anesthesia with the patient or authorized representative who has indicated his/her understanding and acceptance.     Dental Advisory Given  Plan Discussed with: Anesthesiologist, CRNA and Surgeon  Anesthesia Plan Comments:         Anesthesia Quick Evaluation

## 2019-04-20 LAB — CBC
HCT: 35.9 % — ABNORMAL LOW (ref 39.0–52.0)
Hemoglobin: 12.2 g/dL — ABNORMAL LOW (ref 13.0–17.0)
MCH: 29 pg (ref 26.0–34.0)
MCHC: 34 g/dL (ref 30.0–36.0)
MCV: 85.3 fL (ref 80.0–100.0)
Platelets: 152 10*3/uL (ref 150–400)
RBC: 4.21 MIL/uL — ABNORMAL LOW (ref 4.22–5.81)
RDW: 12.2 % (ref 11.5–15.5)
WBC: 6.6 10*3/uL (ref 4.0–10.5)
nRBC: 0 % (ref 0.0–0.2)

## 2019-04-20 LAB — BASIC METABOLIC PANEL
Anion gap: 6 (ref 5–15)
BUN: 12 mg/dL (ref 8–23)
CO2: 25 mmol/L (ref 22–32)
Calcium: 8.6 mg/dL — ABNORMAL LOW (ref 8.9–10.3)
Chloride: 104 mmol/L (ref 98–111)
Creatinine, Ser: 0.8 mg/dL (ref 0.61–1.24)
GFR calc Af Amer: >60 mL/min (ref >60)
GFR calc non Af Amer: >60 mL/min (ref >60)
Glucose, Bld: 176 mg/dL — ABNORMAL HIGH (ref 70–99)
Potassium: 4.5 mmol/L (ref 3.5–5.1)
Sodium: 135 mmol/L (ref 135–145)

## 2019-04-20 LAB — GLUCOSE, CAPILLARY
Glucose-Capillary: 167 mg/dL — ABNORMAL HIGH (ref 70–99)
Glucose-Capillary: 160 mg/dL — ABNORMAL HIGH (ref 70–99)

## 2019-04-20 LAB — SURGICAL PATHOLOGY

## 2019-04-20 MED ORDER — METHOCARBAMOL 500 MG PO TABS: 500.0000 mg | ORAL_TABLET | Freq: Four times a day (QID) | ORAL | 0 refills | Status: DC | PRN

## 2019-04-20 MED ORDER — DOCUSATE SODIUM 100 MG PO CAPS: 100.0000 mg | ORAL_CAPSULE | Freq: Two times a day (BID) | ORAL | 0 refills | Status: DC

## 2019-04-20 MED ORDER — HYDROCODONE-ACETAMINOPHEN 5-325 MG PO TABS: 1.0000 | ORAL_TABLET | ORAL | 0 refills | Status: DC | PRN

## 2019-04-20 MED ORDER — ENOXAPARIN SODIUM 40 MG/0.4ML ~~LOC~~ SOLN: 40.0000 mg | SUBCUTANEOUS | 0 refills | Status: DC

## 2019-04-20 NOTE — TOC Initial Note (Signed)
Transition of Care (TOC) - Initial/Assessment Note    Patient Details  Name: Donald Stephenson. MRN: [id number] Date of Birth: Dec 24, 1955  Transition of Care [facility]) CM/SW Contact:    Su Hilt, RN Phone Number: 04/20/2019, 10:18 AM  Clinical Narrative:                 Met with the patient to discuss DC plan and needs He lives at home with his spouse and she will provide transportation He can afford his medications, I provided the quote for the Lovenox from his insurance company, The price is $15 He will need a RW and 3 in 1, I notified Brad with [employer] He need HH PT, [facility] has accepted the patient and the patient is aggreeable No additional needs at this time  Expected Discharge Plan: [place] Barriers to Discharge: Continued Medical Work up   Patient Goals and CMS Choice Patient states their goals for this hospitalization and ongoing recovery are:: GO home CMS Medicare.gov Compare Post Acute Care list provided to:: Patient Choice offered to / list presented to : Patient  Expected Discharge Plan and Services Expected Discharge Plan: [place]   Discharge Planning Services: CM Consult Post Acute Care Choice: [place] arrangements for the past 2 months: Single Family Home                 DME Arranged: 3-N-1, Walker rolling DME Agency: [employer] Date DME Agency Contacted: 04/20/19 Time DME Agency Contacted: 54 Representative spoke with at DME Agency: [place]: PT [place]: [facility] (formerly [employer]) Date [place]: 04/20/19 Time [place]: 35 Representative spoke with at [place] Arrangements/Services Living arrangements for the past 2 months: [place] with:: Spouse Patient language and need for interpreter reviewed:: Yes Do you feel safe going back to the place where you live?: Yes      Need for Family Participation in  Patient Care: No (Comment) Care giver support system in place?: Yes (comment)   Criminal Activity/Legal Involvement Pertinent to Current Situation/Hospitalization: No - Comment as needed  Activities of Daily Living Home Assistive Devices/Equipment: None ADL Screening (condition at time of admission) Patient's cognitive ability adequate to safely complete daily activities?: Yes Is the patient deaf or have difficulty hearing?: No Does the patient have difficulty seeing, even when wearing glasses/contacts?: Yes Does the patient have difficulty concentrating, remembering, or making decisions?: No Patient able to express need for assistance with ADLs?: Yes Does the patient have difficulty dressing or bathing?: No Independently performs ADLs?: No Communication: Independent Dressing (OT): Needs assistance Is this a change from baseline?: Change from baseline, expected to last <3days Grooming: Independent Feeding: Independent Bathing: Needs assistance Is this a change from baseline?: Change from baseline, expected to last <3 days Toileting: Needs assistance Is this a change from baseline?: Change from baseline, expected to last <3 days In/Out Bed: Needs assistance Is this a change from baseline?: Change from baseline, expected to last <3 days Walks in Home: Independent Does the patient have difficulty walking or climbing stairs?: No Weakness of Legs: None Weakness of Arms/Hands: None  Permission Sought/Granted   Permission granted to share information with : Yes, Verbal Permission Granted              Emotional Assessment Appearance:: Appears stated age Attitude/Demeanor/Rapport: Engaged Affect (typically observed): Appropriate, Calm Orientation: : Oriented to Self, Oriented to  Place, Oriented to  Time, Oriented to Situation Alcohol / Substance Use: Not Applicable Psych Involvement: No (comment)  Admission diagnosis:  PRIMARY OSTEOARTHRITIS OF LEFT HIP Patient Active Problem  List   Diagnosis Date Noted  . 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Phone: [phone]: [phone]    Social Determinants of Health (SDOH) Interventions    Readmission Risk Interventions No flowsheet data found.

## 2019-04-20 NOTE — Progress Notes (Signed)
Physical Therapy Treatment Patient Details Name: Donald Stephenson. MRN: [id number] DOB: 07/25/1955 Today's Date: 04/20/2019    History of Present Illness pt is a 63 yo male admitted s/p L THA on 04/19/19. PMH includes HTN, DM, arthritis    PT Comments    Pt in chair upon arrival and eager to get up with PT. Pt is progressing well towards PT goals. Pt progressed ambulation distance with improved gait pattern and also progressed to stair training this afternoon. Pt educated on up with the good down with the bad technique for navigating stairs. Pt performed x2 trials on stairs ascending/descending safely with no buckling, unsteadiness or LOB noted. Pt reported that he felt like he and his wife could manage the stairs safely at home. Pt educated on technique for getting in and out of the car after discharge from hospital with demonstration provided and pt verbalizing understanding. Pt would continue to benefit from skilled acute therapy to further improve strength, ROM, gait and stair training. Home health PT following discharge remains appropriate.    Follow Up Recommendations  Home health PT     Equipment Recommendations  Rolling walker with 5" wheels;3in1 (PT)    Recommendations for Other Services       Precautions / Restrictions Precautions Precautions: Fall;Anterior Hip Precaution Booklet Issued: Yes (comment) Restrictions Weight Bearing Restrictions: Yes LLE Weight Bearing: Weight bearing as tolerated    Mobility  Bed Mobility Overal bed mobility: Needs Assistance Bed Mobility: Supine to Sit     Supine to sit: Min guard;HOB elevated     General bed mobility comments: deferred, pt up in recliner upon arrival  Transfers Overall transfer level: Needs assistance Equipment used: Rolling walker (2 wheeled) Transfers: Sit to/from Stand Sit to Stand: Min guard         General transfer comment: pt with good carryover on hand placement, pt had to attempt x3 to achieve full  standing seconday to two quick descents back to chair, pt reports he was feeling really stiff because he had been sitting up in chair since early this morning, pt safe with STS on third attempt with no unsteadiness or LOB and required no physical assist  Ambulation/Gait Ambulation/Gait assistance: Min guard Gait Distance (Feet): 200 Feet Assistive device: Rolling walker (2 wheeled) Gait Pattern/deviations: Step-to pattern;Decreased step length - left;Antalgic;Decreased dorsiflexion - left;Decreased stance time - left Gait velocity: mildly decreased   General Gait Details: pt ambulated lap around nurses station and to therapy gym, pt ambulated with mildly antalgic gait pattern, pt had mildly decreased gait speed, pt with much improved gait pattern and further improved weight acceptance on LLE decreasing UE support, improving gait speed and stride length throughout ambulation   Stairs Stairs: Yes Stairs assistance: Min guard Stair Management: No rails;Two rails;Step to pattern;Forwards;Backwards;With walker Number of Stairs: 4 General stair comments: initial stair training began with BHR use and forward step to pattern, pt educated on up with the good down with the bad pattern, pt verbalized understanding and demonstrated requiring min cuing for correct technique and missing technique on one step, pt then performed 2 steps with no HR to stimulate what he will have at one using RW and going up backwards, pt safe and steady with stairs and reported feeling comfortable with stairs with that technique, pt educated on having caregiver assisting with RW, pt reported that he thought he and his wife could manage at home   Wheelchair Mobility    Modified Rankin (Stroke Patients Only)  Balance Overall balance assessment: Needs assistance Sitting-balance support: Feet supported;Single extremity supported Sitting balance-Leahy Scale: Good Sitting balance - Comments: pt steady sitting during LE  therex     Standing balance-Leahy Scale: Fair Standing balance comment: pt reliant on UE support to maintain standing balance consistent with post op status                            Cognition Arousal/Alertness: Awake/alert Behavior During Therapy: WFL for tasks assessed/performed Overall Cognitive Status: Within Functional Limits for tasks assessed                                        Exercises Total Joint Exercises Ankle Circles/Pumps: AROM;Both;10 reps Long Arc Quad: AROM;Both;20 reps Marching in Standing: AROM;Both;10 reps    General Comments General comments (skin integrity, edema, etc.): wound vac in place start/end of session      Pertinent Vitals/Pain Pain Assessment: No/denies pain    Home Living                      Prior Function            PT Goals (current goals can now be found in the care plan section) Progress towards PT goals: Progressing toward goals    Frequency    BID      PT Plan Current plan remains appropriate    Co-evaluation              AM-PAC PT "6 Clicks" Mobility   Outcome Measure  Help needed turning from your back to your side while in a flat bed without using bedrails?: A Little Help needed moving from lying on your back to sitting on the side of a flat bed without using bedrails?: A Little Help needed moving to and from a bed to a chair (including a wheelchair)?: A Little Help needed standing up from a chair using your arms (e.g., wheelchair or bedside chair)?: A Little Help needed to walk in hospital room?: A Little Help needed climbing 3-5 steps with a railing? : A Little 6 Click Score: 18    End of Session Equipment Utilized During Treatment: Gait belt Activity Tolerance: Patient tolerated treatment well Patient left: in chair;with call bell/phone within reach;with SCD's reapplied Nurse Communication: Mobility status PT Visit Diagnosis: Difficulty in walking, not elsewhere  classified (R26.2);Other abnormalities of gait and mobility (R26.89)     Time: [id number] PT Time Calculation (min) (ACUTE ONLY): 22 min  Charges:  $Therapeutic Exercise: 8-22 mins                     Jalyric Kaestner PT, DPT 3:16 PM,04/20/19 [phone]

## 2019-04-20 NOTE — Progress Notes (Signed)
Pt discharged home at this time. Taken to [facility] by wheelchair. VSS prior to discharge. Pt is alert and oriented and denies pain. All discharge education reviewed.

## 2019-04-20 NOTE — Discharge Instructions (Signed)
Acute Pain, Adult Acute pain is a type of pain that may last for just a few days or as long as six months. It is often related to an illness, injury, or medical procedure. Acute pain may be mild, moderate, or severe. It usually goes away once your injury has healed or you are no longer ill. Pain can make it hard for you to do daily activities. It can cause anxiety and lead to other problems if left untreated. Treatment depends on the cause and severity of your acute pain. Follow these instructions at home:  Check your pain level as told by your health care provider.  Take over-the-counter and prescription medicines only as told by your health care provider.  If you are taking prescription pain medicine: ? Ask your health care provider about taking a stool softener or laxative to prevent constipation. ? Do not stop taking the medicine suddenly. Talk to your health care provider about how and when to discontinue prescription pain medicine. ? If your pain is severe, do not take more pills than instructed by your health care provider. ? Do not take other over-the-counter pain medicines in addition to this medicine unless told by your health care provider. ? Do not drive or operate heavy machinery while taking prescription pain medicine.  Apply ice or heat as told by your health care provider. These may reduce swelling and pain.  Ask your health care provider if other strategies such as distraction, relaxation, or physical therapies can help your pain.  Keep all follow-up visits as told by your health care provider. This is important. Contact a health care provider if:  You have pain that is not controlled by medicine.  Your pain does not improve or gets worse.  You have side effects from pain medicines, such as vomitingor confusion. Get help right away if:  You have severe pain.  You have trouble breathing.  You lose consciousness.  You have chest pain or pressure that lasts for  more than a few minutes. Along with the chest pain you may: ? Have pain or discomfort in one or both arms, your back, neck, jaw, or stomach. ? Have shortness of breath. ? Break out in a cold sweat. ? Feel nauseous. ? Become light-headed. These symptoms may represent a serious problem that is an emergency. Do not wait to see if the symptoms will go away. Get medical help right away. Call your local emergency services (911 in the [place] Do not drive yourself to the hospital. This information is not intended to replace advice given to you by your health care provider. Make sure you discuss any questions you have with your health care provider. Document Released: 07/15/2015 Document Revised: 07/15/2017 Document Reviewed: 07/15/2015 Elsevier Patient Education  2020 [place] REPLACEMENT POSTOPERATIVE DIRECTIONS   Hip Rehabilitation, Guidelines Following Surgery  The results of a hip operation are greatly improved after range of motion and muscle strengthening exercises. Follow all safety measures which are given to protect your hip. If any of these exercises cause increased pain or swelling in your joint, decrease the amount until you are comfortable again. Then slowly increase the exercises. Call your caregiver if you have problems or questions.   HOME CARE INSTRUCTIONS  Remove items at home which could result in a fall. This includes throw rugs or furniture in walking pathways.   ICE to the affected hip every three hours for 30 minutes at a time and then as needed  for pain and swelling.  Continue to use ice on the hip for pain and swelling from surgery. You may notice swelling that will progress down to the foot and ankle.  This is normal after surgery.  Elevate the leg when you are not up walking on it.    Continue to use the breathing machine which will help keep your temperature down.  It is common for your temperature to cycle up and down following surgery,  especially at night when you are not up moving around and exerting yourself.  The breathing machine keeps your lungs expanded and your temperature down.  Do not place pillow under knee, focus on keeping the knee straight while resting  DIET You may resume your previous home diet once your are discharged from the hospital.  DRESSING / WOUND CARE / SHOWERING Please remove provena negative pressure dressing on 04/27/2019 and apply honey comb dressing. Keep dressing clean and dry at all times.   ACTIVITY Walk with your walker as instructed. Use walker as long as suggested by your caregivers. Avoid periods of inactivity such as sitting longer than an hour when not asleep. This helps prevent blood clots.  You may resume a sexual relationship in one month or when given the OK by your doctor.  You may return to work once you are cleared by your doctor.  Do not drive a car for 6 weeks or until released by you surgeon.  Do not drive while taking narcotics.  WEIGHT BEARING Weight bearing as tolerated. Use walker/cane as needed for at least 4 weeks post op.  POSTOPERATIVE CONSTIPATION PROTOCOL Constipation - defined medically as fewer than three stools per week and severe constipation as less than one stool per week.  One of the most common issues patients have following surgery is constipation.  Even if you have a regular bowel pattern at home, your normal regimen is likely to be disrupted due to multiple reasons following surgery.  Combination of anesthesia, postoperative narcotics, change in appetite and fluid intake all can affect your bowels.  In order to avoid complications following surgery, here are some recommendations in order to help you during your recovery period.  Colace (docusate) - Pick up an over-the-counter form of Colace or another stool softener and take twice a day as long as you are requiring postoperative pain medications.  Take with a full glass of water daily.  If you  experience loose stools or diarrhea, hold the colace until you stool forms back up.  If your symptoms do not get better within 1 week or if they get worse, check with your doctor.  Dulcolax (bisacodyl) - Pick up over-the-counter and take as directed by the product packaging as needed to assist with the movement of your bowels.  Take with a full glass of water.  Use this product as needed if not relieved by Colace only.   MiraLax (polyethylene glycol) - Pick up over-the-counter to have on hand.  MiraLax is a solution that will increase the amount of water in your bowels to assist with bowel movements.  Take as directed and can mix with a glass of water, juice, soda, coffee, or tea.  Take if you go more than two days without a movement. Do not use MiraLax more than once per day. Call your doctor if you are still constipated or irregular after using this medication for 7 days in a row.  If you continue to have problems with postoperative constipation, please contact the office for  further assistance and recommendations.  If you experience "the worst abdominal pain ever" or develop nausea or vomiting, please contact the office immediatly for further recommendations for treatment.  ITCHING  If you experience itching with your medications, try taking only a single pain pill, or even half a pain pill at a time.  You can also use Benadryl over the counter for itching or also to help with sleep.   TED HOSE STOCKINGS Wear the elastic stockings on both legs for six weeks following surgery during the day but you may remove then at night for sleeping.  MEDICATIONS See your medication summary on the After Visit Summary that the nursing staff will review with you prior to discharge.  You may have some home medications which will be placed on hold until you complete the course of blood thinner medication.  It is important for you to complete the blood thinner medication as prescribed by your surgeon.  Continue  your approved medications as instructed at time of discharge.  PRECAUTIONS If you experience chest pain or shortness of breath - call 911 immediately for transfer to the hospital emergency department.  If you develop a fever greater that 101 F, purulent drainage from wound, increased redness or drainage from wound, foul odor from the wound/dressing, or calf pain - CONTACT YOUR SURGEON.                                                   FOLLOW-UP APPOINTMENTS Make sure you keep all of your appointments after your operation with your surgeon and caregivers. You should call the office at the above phone number and make an appointment for approximately two weeks after the date of your surgery or on the date instructed by your surgeon outlined in the "After Visit Summary".  RANGE OF MOTION AND STRENGTHENING EXERCISES  These exercises are designed to help you keep full movement of your hip joint. Follow your caregiver's or physical therapist's instructions. Perform all exercises about fifteen times, three times per day or as directed. Exercise both hips, even if you have had only one joint replacement. These exercises can be done on a training (exercise) mat, on the floor, on a table or on a bed. Use whatever works the best and is most comfortable for you. Use music or television while you are exercising so that the exercises are a pleasant break in your day. This will make your life better with the exercises acting as a break in routine you can look forward to.  Lying on your back, slowly slide your foot toward your buttocks, raising your knee up off the floor. Then slowly slide your foot back down until your leg is straight again.  Lying on your back spread your legs as far apart as you can without causing discomfort.  Lying on your side, raise your upper leg and foot straight up from the floor as far as is comfortable. Slowly lower the leg and repeat.  Lying on your back, tighten up the muscle in the front  of your thigh (quadriceps muscles). You can do this by keeping your leg straight and trying to raise your heel off the floor. This helps strengthen the largest muscle supporting your knee.  Lying on your back, tighten up the muscles of your buttocks both with the legs straight and with the knee bent  at a comfortable angle while keeping your heel on the floor.   IF YOU ARE TRANSFERRED TO A SKILLED REHAB FACILITY If the patient is transferred to a skilled rehab facility following release from the hospital, a list of the current medications will be sent to the facility for the patient to continue.  When discharged from the skilled rehab facility, please have the facility set up the patient's [place] prior to being released. Also, the skilled facility will be responsible for providing the patient with their medications at time of release from the facility to include their pain medication, the muscle relaxants, and their blood thinner medication. If the patient is still at the rehab facility at time of the two week follow up appointment, the skilled rehab facility will also need to assist the patient in arranging follow up appointment in our office and any transportation needs.  MAKE SURE YOU:  Understand these instructions.  Get help right away if you are not doing well or get worse.    Pick up stool softner and laxative for home use following surgery while on pain medications. Continue to use ice for pain and swelling after surgery. Do not use any lotions or creams on the incision until instructed by your surgeon.

## 2019-04-20 NOTE — Discharge Summary (Signed)
Physician Discharge Summary  Patient ID: Donald Stephenson. MRN: [id number] DOB/AGE: 65/01/1956 65 y.o.  Admit date: 04/19/2019 Discharge date: 04/20/2019  Admission Diagnoses:  PRIMARY OSTEOARTHRITIS OF LEFT HIP   Discharge Diagnoses: Patient Active Problem List   Diagnosis Date Noted  . 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 Arthritis   . Asthma    as a child-no problems since childhood  . Diabetes mellitus without complication ([place])   . Hypertension      Transfusion: none   Consultants (if any):   Discharged Condition: Improved  Hospital Course: Donald Aidyn Calkin. is an 63 y.o. male who was admitted 04/19/2019 with a diagnosis of <principal problem not specified> and went to the operating room on 04/19/2019 and underwent the above named procedures.    Surgeries: Procedure(s): TOTAL HIP ARTHROPLASTY ANTERIOR APPROACH on 04/19/2019 Patient tolerated the surgery well. Taken to PACU where she was stabilized and then transferred to the orthopedic floor.  Started on Lovenox 40 mg q 24 hrs. Foot pumps applied bilaterally at 80 mm. Heels elevated on bed with rolled towels. No evidence of DVT. Negative Homan. Physical therapy started on day #1 for gait training and transfer. OT started day #1 for ADL and assisted devices.  Patient's foley was d/c on day #1. Patient's IV was d/c on day #2.  On post op day #1 patient was stable and ready for discharge to home with home health PT.  Implants: Medacta AMIS 1 lateralized stem, L metal 28 mm head, 54 mm Mpact TM cup and liner  He was given perioperative antibiotics:  Anti-infectives (From admission, onward)   Start     Dose/Rate Route Frequency Ordered Stop   04/19/19 1200  ceFAZolin (ANCEF) IVPB 2g/100 mL premix     2 g 200 mL/hr over 30 Minutes Intravenous Every 6 hours 04/19/19 1137 04/20/19 0045   04/19/19 0816  ceFAZolin (ANCEF) 2-4 GM/100ML-% IVPB    Note to Pharmacy:  Myles Lipps   : cabinet override      04/19/19 0816 04/19/19 0916   04/19/19 0115  ceFAZolin (ANCEF) IVPB 2g/100 mL premix     2 g 200 mL/hr over 30 Minutes Intravenous  Once 04/19/19 0107 04/19/19 0946    .  He was given sequential compression devices, early ambulation, and Lovenox, teds for DVT prophylaxis.  He benefited maximally from the hospital stay and there were no complications.    Recent vital signs:  Vitals:   04/20/19 0746 04/20/19 1117  BP: (!) 154/92 116/80  Pulse: 75 66  Resp: 18 18  Temp: 98.7 F (37.1 C) 98 F (36.7 C)  SpO2: 100% 100%    Recent laboratory studies:  Lab Results  Component Value Date   HGB 12.2 (L) 04/20/2019   HGB 12.7 (L) 04/19/2019   HGB 13.5 04/11/2019   Lab Results  Component Value Date   WBC 6.6 04/20/2019   PLT 152 04/20/2019   Lab Results  Component Value Date   INR 1.0 04/11/2019   Lab Results  Component Value Date   NA 135 04/20/2019   K 4.5 04/20/2019   CL 104 04/20/2019   CO2 25 04/20/2019   BUN 12 04/20/2019   CREATININE 0.80 04/20/2019   GLUCOSE 176 (H) 04/20/2019    Discharge Medications:   Allergies as of 04/20/2019      Reactions   Bee Venom Swelling   Other Hives   ants  Medication List    TAKE these medications   amLODipine 10 MG tablet Commonly known as: NORVASC Take 10 mg by mouth every morning.   docusate sodium 100 MG capsule Commonly known as: COLACE Take 1 capsule (100 mg total) by mouth 2 (two) times daily.   enoxaparin 40 MG/0.4ML injection Commonly known as: LOVENOX Inject 0.4 mLs (40 mg total) into the skin daily for 14 days. Start taking on: April 21, 2019   glimepiride 4 MG tablet Commonly known as: AMARYL Take 4 mg by mouth 2 (two) times daily with a meal.   HYDROcodone-acetaminophen 5-325 MG tablet Commonly known as: NORCO/VICODIN Take 1-2 tablets by mouth every 4 (four) hours as needed for moderate pain (pain score 4-6).   lisinopril 40 MG tablet Commonly  known as: ZESTRIL Take 40 mg by mouth every morning.   metFORMIN 1000 MG tablet Commonly known as: GLUCOPHAGE Take 1,000 mg by mouth 2 (two) times daily with a meal.   methocarbamol 500 MG tablet Commonly known as: ROBAXIN Take 1 tablet (500 mg total) by mouth every 6 (six) hours as needed for muscle spasms.   METOPROLOL SUCCINATE ER PO Take 25 mg by mouth every morning.   multivitamin tablet Take 1 tablet by mouth daily.            Durable Medical Equipment  (From admission, onward)         Start     Ordered   04/19/19 1138  DME Walker rolling  Once    Question:  Patient needs a walker to treat with the following condition  Answer:  Status post total hip replacement, left   04/19/19 1137   04/19/19 1138  DME Bedside commode  Once    Question:  Patient needs a bedside commode to treat with the following condition  Answer:  Status post total hip replacement, left   04/19/19 1137   04/19/19 1138  DME 3 n 1  Once     04/19/19 1137          Diagnostic Studies: Dg Hip Operative Unilat W Or W/o Pelvis Left  Result Date: 04/19/2019 CLINICAL DATA:  Total left hip arthroplasty. EXAM: DG HIP (WITH OR WITHOUT PELVIS) 2-3V LEFT; OPERATIVE LEFT HIP WITH PELVIS COMPARISON:  None. FINDINGS: Well seated femoral and acetabular components without complicating features. IMPRESSION: Well seated components of a total left hip arthroplasty without complicating features. Electronically Signed   By: Marijo Sanes M.D.   On: 04/19/2019 11:20   Dg Hip Unilat W Or W/o Pelvis 2-3 Views Left  Result Date: 04/19/2019 CLINICAL DATA:  Total left hip arthroplasty. EXAM: DG HIP (WITH OR WITHOUT PELVIS) 2-3V LEFT; OPERATIVE LEFT HIP WITH PELVIS COMPARISON:  None. FINDINGS: Well seated femoral and acetabular components without complicating features. IMPRESSION: Well seated components of a total left hip arthroplasty without complicating features. Electronically Signed   By: Marijo Sanes M.D.   On:  04/19/2019 11:20    Disposition:     Follow-up Information    Duanne Guess, PA-C Follow up in 2 week(s).   Specialties: Orthopedic Surgery, Emergency Medicine Contact information: [place] [address] [phone]            Signed: Feliberto Gottron 04/20/2019, 12:35 PM

## 2019-04-20 NOTE — Anesthesia Postprocedure Evaluation (Signed)
Anesthesia Post Note  Patient: Severt Amore.  Procedure(s) Performed: TOTAL HIP ARTHROPLASTY ANTERIOR APPROACH (Left Hip)  Patient location during evaluation: Nursing Unit Anesthesia Type: Spinal Level of consciousness: oriented and awake and alert Pain management: pain level controlled Vital Signs Assessment: post-procedure vital signs reviewed and stable Respiratory status: spontaneous breathing and respiratory function stable Cardiovascular status: blood pressure returned to baseline and stable Postop Assessment: no headache, no backache, no apparent nausea or vomiting and patient able to bend at knees Anesthetic complications: no     Last Vitals:  Vitals:   04/20/19 0344 04/20/19 0746  BP: (!) 148/86 (!) 154/92  Pulse: 69 75  Resp: 19 18  Temp: 37.3 C 37.1 C  SpO2: 97% 100%    Last Pain:  Vitals:   04/20/19 0452  TempSrc:   PainSc: Asleep                 Hedda Slade

## 2019-04-20 NOTE — Evaluation (Signed)
Occupational Therapy Evaluation Patient Details Name: Donald Stephenson. MRN: [id number] DOB: 06/30/1956 Today's Date: 04/20/2019    History of Present Illness pt is a 63 yo male admitted s/p L THA on 04/19/19. PMH includes HTN, DM, arthritis   Clinical Impression   Donald Stephenson was seen for OT evaluation this date, POD#1 from above surgery. Pt was active and independent in all ADLs/IADLs prior to surgery. He reports driving, working, and managing all self care tasks with total independence. Pt is eager to return to PLOF with less pain and improved safety and independence. Pt currently requires minimal assist for LB dressing and bathing while in seated position due to pain and limited AROM of L hip. Pt instructed in self care skills, falls prevention strategies, home/routines modifications, DME/AE for LB bathing and dressing tasks, and compression stocking mgt strategies. Pt would benefit from additional instruction in self care skills and techniques to help maintain precautions with or without assistive devices to support recall and carryover prior to discharge. Recommend HHOT upon discharge.      Follow Up Recommendations  Home health OT    Equipment Recommendations  3 in 1 bedside commode    Recommendations for Other Services       Precautions / Restrictions Precautions Precautions: Fall;Anterior Hip Restrictions Weight Bearing Restrictions: Yes LLE Weight Bearing: Weight bearing as tolerated      Mobility Bed Mobility Overal bed mobility: Needs Assistance Bed Mobility: Supine to Sit           General bed mobility comments: Deferred. Pt up in recliner at start/end of session.  Transfers                      Balance Overall balance assessment: Needs assistance Sitting-balance support: Feet supported;Single extremity supported Sitting balance-Leahy Scale: Good Sitting balance - Comments: Pt steady sitting, reaching inside BOS during functional activity. No LOB  appreciated with assessment this date.                                   ADL either performed or assessed with clinical judgement   ADL Overall ADL's : Needs assistance/impaired                                       General ADL Comments: Pt req. Min assist for LB ADL mgt due to decreased ROM in the L hip. Uses sock-aid at baseline. OT provided education on use of LHR for lower body dressing this date.     Vision Baseline Vision/History: Wears glasses Wears Glasses: At all times Patient Visual Report: No change from baseline       Perception     Praxis      Pertinent Vitals/Pain Pain Assessment: No/denies pain     Hand Dominance Right   Extremity/Trunk Assessment Upper Extremity Assessment Upper Extremity Assessment: Overall WFL for tasks assessed(BUE grossly WNL during functional activity. Pt denies hx of shoulder pain, arthritis, etc.)   Lower Extremity Assessment Lower Extremity Assessment: Defer to PT evaluation;LLE deficits/detail LLE Deficits / Details: s/p L THA LLE Coordination: decreased gross motor       Communication Communication Communication: No difficulties   Cognition Arousal/Alertness: Awake/alert Behavior During Therapy: WFL for tasks assessed/performed Overall Cognitive Status: Within Functional Limits for tasks assessed  General Comments: Pleasant, cooperative, and agreeable to OT evaluation.   General Comments       Exercises Other Exercises Other Exercises: Pt educated in falls prevention strategies, safe use of AE/DME for ADL mgt and functional mobility, compression stocking mgt, and routines modifications to support safety and independence upon hosjpital DC. Handout provided.   Shoulder Instructions      Home Living Family/patient expects to be discharged to:: Private residence Living Arrangements: Spouse/significant other Available Help at Discharge:  Family;Available PRN/intermittently(Wife works during, dtr also available PRN) Type of Home: House Home Access: Stairs to enter [employer] of Steps: 2 in the back, 3 in the back Entrance Stairs-Rails: None Home Layout: One level     Bathroom Shower/Tub: Walk-in shower;Tub/shower unit   Bathroom Toilet: Standard     Home Equipment: None          Prior Functioning/Environment Level of Independence: Independent        Comments: pt states being independent with all ADLs/IADLs, still working        OT Problem List: Decreased strength;Decreased coordination;Decreased activity tolerance;Decreased safety awareness;Decreased knowledge of precautions;Decreased knowledge of use of DME or AE;Impaired balance (sitting and/or standing)      OT Treatment/Interventions: Self-care/ADL training;Balance training;Therapeutic exercise;Therapeutic activities;DME and/or AE instruction;Patient/family education    OT Goals(Current goals can be found in the care plan section) Acute Rehab OT Goals Patient Stated Goal: return home and to work OT Goal Formulation: With patient Time For Goal Achievement: 05/04/19 Potential to Achieve Goals: Good ADL Goals Pt Will Perform Lower Body Bathing: with modified independence;sit to/from stand(With LRAD PRN for improved safety and functional independence.) Pt Will Perform Lower Body Dressing: with modified independence;with adaptive equipment(With LRAD PRN for improved safety and functional independence.) Pt Will Transfer to Toilet: ambulating;with modified independence;bedside commode(With LRAD PRN for improved safety and functional independence.)  OT Frequency: Min 1X/week   Barriers to D/C:            Co-evaluation              AM-PAC OT "6 Clicks" Daily Activity     Outcome Measure Help from another person eating meals?: None Help from another person taking care of personal grooming?: A Little Help from another person toileting,  which includes using toliet, bedpan, or urinal?: A Little Help from another person bathing (including washing, rinsing, drying)?: A Little Help from another person to put on and taking off regular upper body clothing?: A Little Help from another person to put on and taking off regular lower body clothing?: A Little 6 Click Score: 19   End of Session    Activity Tolerance: Patient tolerated treatment well Patient left: in chair;with call bell/phone within reach;with chair alarm set;with SCD's reapplied  OT Visit Diagnosis: Other abnormalities of gait and mobility (R26.89)                Time: [id number] OT Time Calculation (min): 20 min Charges:  OT General Charges $OT Visit: 1 Visit OT Evaluation $OT Eval Low Complexity: 1 Low OT Treatments $Self Care/Home Management : 8-22 mins  Shara Blazing, M.S., OTR/L Ascom: [phone] 04/20/19, 10:53 AM

## 2019-04-20 NOTE — Progress Notes (Signed)
   Subjective: 1 Day Post-Op Procedure(s) (LRB): TOTAL HIP ARTHROPLASTY ANTERIOR APPROACH (Left) Patient reports pain as 0 on 0-10 scale.   Patient is well, and has had no acute complaints or problems Denies any CP, SOB, ABD pain. We will continue therapy today.  Plan is to go Home after hospital stay.  Objective: Vital signs in last 24 hours: Temp:  [96.8 F (36 C)-99.1 F (37.3 C)] 98.7 F (37.1 C) (10/07 0746) Pulse Rate:  [55-75] 75 (10/07 0746) Resp:  [16-19] 18 (10/07 0746) BP: (109-154)/(70-92) 154/92 (10/07 0746) SpO2:  [68 %-100 %] 100 % (10/07 0746)  Intake/Output from previous day: 10/06 0701 - 10/07 0700 In: 1411.8 [I.V.:1111.8; IV Piggyback:300] Out: 1950 [Urine:1750; Blood:200] Intake/Output this shift: No intake/output data recorded.  Recent Labs    04/19/19 1232 04/20/19 0444  HGB 12.7* 12.2*   Recent Labs    04/19/19 1232 04/20/19 0444  WBC 4.7 6.6  RBC 4.45 4.21*  HCT 38.3* 35.9*  PLT 161 152   Recent Labs    04/19/19 1232 04/20/19 0444  NA  --  135  K  --  4.5  CL  --  104  CO2  --  25  BUN  --  12  CREATININE 0.85 0.80  GLUCOSE  --  176*  CALCIUM  --  8.6*   No results for input(s): LABPT, INR in the last 72 hours.  EXAM General - Patient is Alert, Appropriate and Oriented Extremity - Neurovascular intact Sensation intact distally Intact pulses distally Dorsiflexion/Plantar flexion intact No cellulitis present Compartment soft Dressing - dressing C/D/I and no drainage, Praveena intact without drainage Motor Function - intact, moving foot and toes well on exam.   Past Medical History:  Diagnosis Date  . Arthritis   . Asthma    as a child-no problems since childhood  . Diabetes mellitus without complication ([place])   . Hypertension     Assessment/Plan:   1 Day Post-Op Procedure(s) (LRB): TOTAL HIP ARTHROPLASTY ANTERIOR APPROACH (Left) Active Problems:   Status post total hip replacement, left  Estimated body mass  index is 30.54 kg/m as calculated from the following:   Height as of this encounter: 5\' 7"  (1.702 m).   Weight as of this encounter: 88.5 kg. Advance diet Up with therapy Needs bowel movement Labs are stable Vital signs are stable Encourage incentive spirometer Care management to assist with discharge to home with home health PT   DVT Prophylaxis - Lovenox, TED hose and SCDs Weight-Bearing as tolerated to left leg   T. Rachelle Hora, PA-C [place] 04/20/2019, 8:11 AM

## 2019-04-20 NOTE — Progress Notes (Signed)
Physical Therapy Treatment Patient Details Name: Donald Stephenson. MRN: [id number] DOB: 03-05-1956 Today's Date: 04/20/2019    History of Present Illness pt is a 63 yo male admitted s/p L THA on 04/19/19. PMH includes HTN, DM, arthritis    PT Comments    Pt in bed upon arrival and eager to get up with PT. Pt denies any pain throughout session. Pt performed supine and seated therex with good carryover of proper performance from yesterday. Pt required min VC/TC for correct performance of HEP. Pt progressed to OOB mobility requiring min guard assist. Pt ambulated around nurses station with no unsteadiness, buckling or LOB while gradually increasing amount of WBing through LLE throughout ambulation. Pt is progressing very well towards PT goals and returning to PLOF.  Pt presents with decreased strength, ROM, balance and endurance consistent with recent surgery. Pt will benefit from continued skilled acute therapy and home health therapy following discharge to further improve deficits and allow for return to PLOF.   Follow Up Recommendations  Home health PT     Equipment Recommendations  Rolling walker with 5" wheels;3in1 (PT)    Recommendations for Other Services       Precautions / Restrictions Precautions Precautions: Fall;Anterior Hip Precaution Booklet Issued: Yes (comment) Restrictions Weight Bearing Restrictions: Yes LLE Weight Bearing: Weight bearing as tolerated    Mobility  Bed Mobility Overal bed mobility: Needs Assistance Bed Mobility: Supine to Sit     Supine to sit: Min guard;HOB elevated     General bed mobility comments: pt progressed to only requiring min guard assist to get EOB today, pt utilized increased time and bed rails  Transfers Overall transfer level: Needs assistance Equipment used: Rolling walker (2 wheeled) Transfers: Sit to/from Stand Sit to Stand: Min guard         General transfer comment: pt required vc for correct hand placement, pt able  to rise without physical assist, pt steady and safe with initial rise and standing, increased reliance on UE support from RW  Ambulation/Gait Ambulation/Gait assistance: Min guard Gait Distance (Feet): 180 Feet Assistive device: Rolling walker (2 wheeled) Gait Pattern/deviations: Step-to pattern;Decreased step length - left;Antalgic;Decreased dorsiflexion - left;Decreased stance time - left Gait velocity: decreased   General Gait Details: pt ambulated lap around nurses station with RW, pt ambulated with antalgic step to gait pattern, pt reliant on UE support from RW but gradually able to bear increased weight through LLE throughout ambulation, pt had no instances of unsteadiness, buckling or overt LOB   Stairs             Wheelchair Mobility    Modified Rankin (Stroke Patients Only)       Balance Overall balance assessment: Needs assistance Sitting-balance support: Feet supported;Single extremity supported Sitting balance-Leahy Scale: Good Sitting balance - Comments: pt steady sitting during LE therex     Standing balance-Leahy Scale: Poor Standing balance comment: pt reliant on UE support to maintain standing balance consistent with post op status                            Cognition Arousal/Alertness: Awake/alert Behavior During Therapy: WFL for tasks assessed/performed Overall Cognitive Status: Within Functional Limits for tasks assessed                                 General Comments: Pleasant, cooperative, and agreeable to OT evaluation.  Exercises Total Joint Exercises Ankle Circles/Pumps: AROM;Both;10 reps Quad Sets: AROM;Both;10 reps Gluteal Sets: AROM;Both;10 reps Hip ABduction/ADduction: AROM;Both;10 reps Straight Leg Raises: AROM;Both;10 reps Long Arc Quad: AROM;Both;10 reps Marching in Standing: AROM;Both;10 reps;Seated Other Exercises Other Exercises:     General Comments General comments (skin integrity, edema,  etc.): wound vac in place start/end of session      Pertinent Vitals/Pain Pain Assessment: No/denies pain    Home Living Family/patient expects to be discharged to:: Private residence Living Arrangements: Spouse/significant other Available Help at Discharge: Family;Available PRN/intermittently(Wife works during, dtr also available PRN) Type of Home: House Home Access: Stairs to enter Entrance Stairs-Rails: None Home Layout: One level Home Equipment: None      Prior Function Level of Independence: Independent      Comments: pt states being independent with all ADLs/IADLs, still working   PT Goals (current goals can now be found in the care plan section) Acute Rehab PT Goals Patient Stated Goal: return home and to work Progress towards PT goals: Progressing toward goals    Frequency    BID      PT Plan Current plan remains appropriate    Co-evaluation              AM-PAC PT "6 Clicks" Mobility   Outcome Measure  Help needed turning from your back to your side while in a flat bed without using bedrails?: A Little Help needed moving from lying on your back to sitting on the side of a flat bed without using bedrails?: A Little Help needed moving to and from a bed to a chair (including a wheelchair)?: A Little Help needed standing up from a chair using your arms (e.g., wheelchair or bedside chair)?: A Little Help needed to walk in hospital room?: A Little Help needed climbing 3-5 steps with a railing? : A Lot 6 Click Score: 17    End of Session Equipment Utilized During Treatment: Gait belt Activity Tolerance: Patient tolerated treatment well Patient left: in chair;with call bell/phone within reach;with SCD's reapplied Nurse Communication: Mobility status PT Visit Diagnosis: Difficulty in walking, not elsewhere classified (R26.2);Other abnormalities of gait and mobility (R26.89)     Time: [id number] PT Time Calculation (min) (ACUTE ONLY): 22 min  Charges:   $Therapeutic Exercise: 8-22 mins                     Seletha Zimmermann PT, DPT 11:53 AM,04/20/19 [phone]

## 2019-08-02 ENCOUNTER — Other Ambulatory Visit: Payer: Self-pay

## 2019-08-02 ENCOUNTER — Emergency Department
Admission: EM | Admit: 2019-08-02 | Discharge: 2019-08-02 | Disposition: A | Discharge status: Home / Self Care | Payer: BC Managed Care – PPO | Attending: Emergency Medicine | Admitting: Emergency Medicine

## 2019-08-02 ENCOUNTER — Encounter: Payer: Self-pay | Admitting: Emergency Medicine

## 2019-08-02 DIAGNOSIS — L02612 Cutaneous abscess of left foot: Secondary | ICD-10-CM | POA: Insufficient documentation

## 2019-08-02 DIAGNOSIS — Z96642 Presence of left artificial hip joint: Secondary | ICD-10-CM | POA: Insufficient documentation

## 2019-08-02 DIAGNOSIS — Z79899 Other long term (current) drug therapy: Secondary | ICD-10-CM | POA: Diagnosis not present

## 2019-08-02 DIAGNOSIS — Z87891 Personal history of nicotine dependence: Secondary | ICD-10-CM | POA: Diagnosis not present

## 2019-08-02 DIAGNOSIS — E119 Type 2 diabetes mellitus without complications: Secondary | ICD-10-CM | POA: Insufficient documentation

## 2019-08-02 DIAGNOSIS — Z7984 Long term (current) use of oral hypoglycemic drugs: Secondary | ICD-10-CM | POA: Diagnosis not present

## 2019-08-02 DIAGNOSIS — I10 Essential (primary) hypertension: Secondary | ICD-10-CM | POA: Insufficient documentation

## 2019-08-02 DIAGNOSIS — L84 Corns and callosities: Secondary | ICD-10-CM

## 2019-08-02 DIAGNOSIS — J45909 Unspecified asthma, uncomplicated: Secondary | ICD-10-CM | POA: Diagnosis not present

## 2019-08-02 LAB — BASIC METABOLIC PANEL
Anion gap: 10 (ref 5–15)
BUN: 17 mg/dL (ref 8–23)
CO2: 27 mmol/L (ref 22–32)
Calcium: 9.3 mg/dL (ref 8.9–10.3)
Chloride: 100 mmol/L (ref 98–111)
Creatinine, Ser: 1.08 mg/dL (ref 0.61–1.24)
GFR calc Af Amer: >60 mL/min (ref >60)
GFR calc non Af Amer: >60 mL/min (ref >60)
Glucose, Bld: 237 mg/dL — ABNORMAL HIGH (ref 70–99)
Potassium: 4.5 mmol/L (ref 3.5–5.1)
Sodium: 137 mmol/L (ref 135–145)

## 2019-08-02 LAB — CBC WITH DIFFERENTIAL/PLATELET
Abs Immature Granulocytes: 0.12 10*3/uL — ABNORMAL HIGH (ref 0.00–0.07)
Basophils Absolute: 0 10*3/uL (ref 0.0–0.1)
Basophils Relative: 0 %
Eosinophils Absolute: 0 10*3/uL (ref 0.0–0.5)
Eosinophils Relative: 0 %
HCT: 41 % (ref 39.0–52.0)
Hemoglobin: 13.9 g/dL (ref 13.0–17.0)
Immature Granulocytes: 1 %
Lymphocytes Relative: 13 %
Lymphs Abs: 1.4 10*3/uL (ref 0.7–4.0)
MCH: 28.7 pg (ref 26.0–34.0)
MCHC: 33.9 g/dL (ref 30.0–36.0)
MCV: 84.7 fL (ref 80.0–100.0)
Monocytes Absolute: 1 10*3/uL (ref 0.1–1.0)
Monocytes Relative: 9 %
Neutro Abs: 8.3 10*3/uL — ABNORMAL HIGH (ref 1.7–7.7)
Neutrophils Relative %: 77 %
Platelets: 159 10*3/uL (ref 150–400)
RBC: 4.84 MIL/uL (ref 4.22–5.81)
RDW: 12.8 % (ref 11.5–15.5)
WBC: 10.8 10*3/uL — ABNORMAL HIGH (ref 4.0–10.5)
nRBC: 0 % (ref 0.0–0.2)

## 2019-08-02 MED ORDER — LIDOCAINE HCL (PF) 1 % IJ SOLN
5.0000 mL | Freq: Once | INTRAMUSCULAR | Status: AC
  Administered 2019-08-02: 11:00:00 5 mL

## 2019-08-02 MED ORDER — TRAMADOL HCL 50 MG PO TABS: 50.0000 mg | ORAL_TABLET | Freq: Two times a day (BID) | ORAL | 0 refills | Status: DC | PRN

## 2019-08-02 MED ORDER — SODIUM CHLORIDE 0.9 % IV BOLUS
1000.0000 mL | Freq: Once | INTRAVENOUS | Status: AC
  Administered 2019-08-02: 11:00:00 1000 mL via INTRAVENOUS

## 2019-08-02 MED ORDER — HYDROMORPHONE HCL 1 MG/ML IJ SOLN
1.0000 mg | Freq: Once | INTRAMUSCULAR | Status: AC
  Administered 2019-08-02: 11:00:00 1 mg via INTRAMUSCULAR
  Filled 2019-08-02: qty 1

## 2019-08-02 MED ORDER — CLINDAMYCIN PHOSPHATE 600 MG/50ML IV SOLN
600.0000 mg | Freq: Once | INTRAVENOUS | Status: AC
  Administered 2019-08-02: 11:00:00 600 mg via INTRAVENOUS
  Filled 2019-08-02: qty 50

## 2019-08-02 MED ORDER — CLINDAMYCIN HCL 300 MG PO CAPS: 300.0000 mg | ORAL_CAPSULE | Freq: Three times a day (TID) | ORAL | 0 refills | Status: AC

## 2019-08-02 NOTE — ED Triage Notes (Signed)
Pt has very large callous like area to bottom of left foot. No redness around wound.  Pt works 12 hour shifts and has just been getting worse since always on feet.  No fevers.  Has had some mild drainage per pt.

## 2019-08-02 NOTE — ED Provider Notes (Signed)
[facility] Emergency Department Provider Note   ____________________________________________   First MD Initiated Contact with Patient 08/02/19 0900     (approximate)  I have reviewed the triage vital signs and the nursing notes.   HISTORY  Chief Complaint Callouses    HPI Donald Stephenson. is a 64 y.o. male patient has callous and abscess to the plantar aspect of the left foot.  There is mild drainage from the lesion.  Patient also has calluses on the right foot.  Patient is a works 12-hour shifts mostly on his feet.  Patient is diabetic.  Patient is afebrile.         Past Medical History:  Diagnosis Date  . Arthritis   . Asthma    as a child-no problems since childhood  . Diabetes mellitus without complication ([place])   . Hypertension     Patient Active Problem List   Diagnosis Date Noted  . 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 NO PAST SURGERIES    . TOTAL HIP ARTHROPLASTY Left 04/19/2019   Procedure: TOTAL HIP ARTHROPLASTY ANTERIOR APPROACH;  Surgeon: Hessie Knows, MD;  Location: [facility] ORS;  Service: Orthopedics;  Laterality: Left;    Prior to Admission medications   Medication Sig Start Date End Date Taking? Authorizing Provider  amLODipine (NORVASC) 10 MG tablet Take 10 mg by mouth every morning.    [provider]  clindamycin (CLEOCIN) 300 MG capsule Take 1 capsule (300 mg total) by mouth 3 (three) times daily for 10 days. 08/02/19 08/12/19  Sable Feil, PA-C  docusate sodium (COLACE) 100 MG capsule Take 1 capsule (100 mg total) by mouth 2 (two) times daily. 04/20/19   Duanne Guess, PA-C  enoxaparin (LOVENOX) 40 MG/0.4ML injection Inject 0.4 mLs (40 mg total) into the skin daily for 14 days. 04/21/19 05/05/19  Duanne Guess, PA-C  glimepiride (AMARYL) 4 MG tablet Take 4 mg by mouth 2 (two) times daily with a meal.    [provider]    lisinopril (ZESTRIL) 40 MG tablet Take 40 mg by mouth every morning.    [provider]  metFORMIN (GLUCOPHAGE) 1000 MG tablet Take 1,000 mg by mouth 2 (two) times daily with a meal.    [provider]  METOPROLOL SUCCINATE ER PO Take 25 mg by mouth every morning.    [provider]  Multiple Vitamin (MULTIVITAMIN) tablet Take 1 tablet by mouth daily.    [provider]  traMADol (ULTRAM) 50 MG tablet Take 1 tablet (50 mg total) by mouth every 12 (twelve) hours as needed. 08/02/19   Sable Feil, PA-C    Allergies Bee venom and Other  History reviewed. No pertinent family history.  Social History Social History   Tobacco Use  . Smoking status: Former Smoker    Years: 2.00    Types: Cigarettes    Quit date: 04/10/1969    Years since quitting: 50.3  . Smokeless tobacco: Never Used  Substance Use Topics  . Alcohol use: Yes    Comment: 2-3 shots and 1 beer daily  . Drug use: Never    Review of Systems  Constitutional: No fever/chills Eyes: No visual changes. ENT: No sore throat. Cardiovascular: Denies chest pain. Respiratory: Denies shortness of breath. Gastrointestinal: No abdominal pain.  No nausea, no vomiting.  No diarrhea.  No constipation. Genitourinary: Negative for dysuria. Musculoskeletal: Negative for back pain. Skin:  Negative for rash.  Bilateral plantar callus.  Abscess plantar aspect of left foot Neurological: Negative for headaches, focal weakness or numbness. Endocrine:  Diabetes and hypertension  Allergic/Immunilogical: Bee sting ____________________________________________   PHYSICAL EXAM:  VITAL SIGNS: ED Triage Vitals [08/02/19 0850]  Enc Vitals Group     BP (!) 159/94     Pulse Rate (!) 108     Resp 18     Temp 98.4 F (36.9 C)     Temp Source Oral     SpO2 99 %     Weight 190 lb (86.2 kg)     Height 5\' 7"  (1.702 m)     Head Circumference      Peak Flow      Pain Score 0     Pain Loc      Pain Edu?       Excl. in [place]?     Constitutional: Alert and oriented. Well appearing and in no acute distress. Cardiovascular: Normal rate, regular rhythm. Grossly normal heart sounds.  Good peripheral circulation.  Elevated blood pressure Respiratory: Normal respiratory effort.  No retractions. Lungs CTAB. Musculoskeletal: No lower extremity tenderness nor edema.  No joint effusions. Neurologic:  Normal speech and language. No gross focal neurologic deficits are appreciated. No gait instability. Skin: Abscess plantar aspect of left foot.  Bilateral calluses plantar aspect of foot.   Psychiatric: Mood and affect are normal. Speech and behavior are normal.  ____________________________________________   LABS (all labs ordered are listed, but only abnormal results are displayed)  Labs Reviewed  BASIC METABOLIC PANEL - Abnormal; Notable for the following components:      Result Value   Glucose, Bld 237 (*)    All other components within normal limits  CBC WITH DIFFERENTIAL/PLATELET - Abnormal; Notable for the following components:   WBC 10.8 (*)    Neutro Abs 8.3 (*)    Abs Immature Granulocytes 0.12 (*)    All other components within normal limits   ____________________________________________  EKG   ____________________________________________  RADIOLOGY  ED MD interpretation:    Official radiology report(s): No results found.  ____________________________________________   PROCEDURES  Procedure(s) performed (including Critical Care):  Marland KitchenMarland KitchenIncision and Drainage  Date/Time: 08/02/2019 10:45 AM Performed by: Sable Feil, PA-C Authorized by: Sable Feil, PA-C   Consent:    Consent obtained:  Verbal   Consent given by:  Patient   Risks discussed:  Bleeding, infection, incomplete drainage and pain   Alternatives discussed:  Alternative treatment, delayed treatment and observation Location:    Type:  Abscess   Location:  Lower extremity   Lower extremity location:   Foot   Foot location:  L foot Pre-procedure details:    Skin preparation:  Antiseptic wash and Betadine Procedure type:    Complexity:  Complex Procedure details:    Incision types:  Single with marsupialization   Incision depth:  Dermal   Scalpel blade:  11   Wound management:  Probed and deloculated and irrigated with saline   Drainage:  Purulent   Drainage amount:  Copious   Wound treatment:  Wound left open   Packing materials:  1/4 in iodoform gauze Post-procedure details:    Patient tolerance of procedure:  Tolerated well, no immediate complications     ____________________________________________   INITIAL IMPRESSION / ASSESSMENT AND PLAN / ED COURSE  As part of my medical decision making, I reviewed the following data within the [address]     Patient  presents with abscess to the plantar aspect left foot.  Patient also have bilateral calluses.  See procedure note for incision and drainage.  Patient given IV antibiotics.  Patient advised return to ED in 2 days for wound care.  Patient also advised schedule appointment with podiatry for definitive evaluation and treatment.   Capri Shuhei Balsamo. was evaluated in Emergency Department on 08/02/2019 for the symptoms described in the history of present illness. He was evaluated in the context of the global COVID-19 pandemic, which necessitated consideration that the patient might be at risk for infection with the SARS-CoV-2 virus that causes COVID-19. Institutional protocols and algorithms that pertain to the evaluation of patients at risk for COVID-19 are in a state of rapid change based on information released by regulatory bodies including the CDC and federal and state organizations. These policies and algorithms were followed during the patient's care in the ED.       ____________________________________________   FINAL CLINICAL IMPRESSION(S) / ED DIAGNOSES  Final diagnoses:  Abscess of left foot  Callus  of foot     ED Discharge Orders         Ordered    clindamycin (CLEOCIN) 300 MG capsule  3 times daily     08/02/19 1040    traMADol (ULTRAM) 50 MG tablet  Every 12 hours PRN     08/02/19 1040           Note:  This document was prepared using Dragon voice recognition software and may include unintentional dictation errors.    Sable Feil, PA-C 08/02/19 1047    Carrie Mew, MD 08/02/19 [phone]

## 2019-08-02 NOTE — Discharge Instructions (Signed)
Follow discharge care instructions and take medication as directed.  Return in 2 days to have wound recheck.  Advised to call podiatry to schedule appointment.  Advised him to follow-up in emergency room.

## 2019-08-04 ENCOUNTER — Emergency Department
Admission: EM | Admit: 2019-08-04 | Discharge: 2019-08-04 | Disposition: A | Discharge status: Home / Self Care | Payer: BC Managed Care – PPO | Attending: Student in an Organized Health Care Education/Training Program | Admitting: Student in an Organized Health Care Education/Training Program

## 2019-08-04 ENCOUNTER — Encounter: Payer: Self-pay | Admitting: Emergency Medicine

## 2019-08-04 ENCOUNTER — Other Ambulatory Visit: Payer: Self-pay

## 2019-08-04 DIAGNOSIS — L02612 Cutaneous abscess of left foot: Secondary | ICD-10-CM | POA: Insufficient documentation

## 2019-08-04 DIAGNOSIS — Z4800 Encounter for change or removal of nonsurgical wound dressing: Secondary | ICD-10-CM | POA: Diagnosis not present

## 2019-08-04 DIAGNOSIS — E119 Type 2 diabetes mellitus without complications: Secondary | ICD-10-CM | POA: Insufficient documentation

## 2019-08-04 DIAGNOSIS — Z7984 Long term (current) use of oral hypoglycemic drugs: Secondary | ICD-10-CM | POA: Diagnosis not present

## 2019-08-04 DIAGNOSIS — Z5189 Encounter for other specified aftercare: Secondary | ICD-10-CM

## 2019-08-04 NOTE — ED Provider Notes (Signed)
[facility] Emergency Department Provider Note   ____________________________________________   First MD Initiated Contact with Patient 08/04/19 [phone]     (approximate)  I have reviewed the triage vital signs and the nursing notes.   HISTORY  Chief Complaint Wound Check   HPI Yoskar Jireh Lubinski. is a 64 y.o. male presents to the ED today for wound recheck after having an abscess I&D need 2 days ago.  Patient continues to take his antibiotic as directed.  He states that he now can walk without pain.  Patient has a history of diabetes and does not have a podiatrist.      Past Medical History:  Diagnosis Date  . Arthritis   . Asthma    as a child-no problems since childhood  . Diabetes mellitus without complication ([place])   . Hypertension     Patient Active Problem List   Diagnosis Date Noted  . 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 NO PAST SURGERIES    . TOTAL HIP ARTHROPLASTY Left 04/19/2019   Procedure: TOTAL HIP ARTHROPLASTY ANTERIOR APPROACH;  Surgeon: Hessie Knows, MD;  Location: [facility] ORS;  Service: Orthopedics;  Laterality: Left;    Prior to Admission medications   Medication Sig Start Date End Date Taking? Authorizing Provider  amLODipine (NORVASC) 10 MG tablet Take 10 mg by mouth every morning.    [provider]  clindamycin (CLEOCIN) 300 MG capsule Take 1 capsule (300 mg total) by mouth 3 (three) times daily for 10 days. 08/02/19 08/12/19  Sable Feil, PA-C  docusate sodium (COLACE) 100 MG capsule Take 1 capsule (100 mg total) by mouth 2 (two) times daily. 04/20/19   Duanne Guess, PA-C  glimepiride (AMARYL) 4 MG tablet Take 4 mg by mouth 2 (two) times daily with a meal.    [provider]  lisinopril (ZESTRIL) 40 MG tablet Take 40 mg by mouth every morning.    [provider]  metFORMIN (GLUCOPHAGE) 1000 MG tablet Take 1,000 mg by mouth  2 (two) times daily with a meal.    [provider]  METOPROLOL SUCCINATE ER PO Take 25 mg by mouth every morning.    [provider]  Multiple Vitamin (MULTIVITAMIN) tablet Take 1 tablet by mouth daily.    [provider]  traMADol (ULTRAM) 50 MG tablet Take 1 tablet (50 mg total) by mouth every 12 (twelve) hours as needed. 08/02/19   Sable Feil, PA-C    Allergies Bee venom and Other  No family history on file.  Social History Social History   Tobacco Use  . Smoking status: Former Smoker    Years: 2.00    Types: Cigarettes    Quit date: 04/10/1969    Years since quitting: 50.3  . Smokeless tobacco: Never Used  Substance Use Topics  . Alcohol use: Yes    Comment: 2-3 shots and 1 beer daily  . Drug use: Never    Review of Systems Constitutional: No fever/chills Cardiovascular: Denies chest pain. Respiratory: Denies shortness of breath. Skin: Left foot plantar aspect I&D abscess. Neurological: Negative for  focal weakness or numbness. ____________________________________________   PHYSICAL EXAM:  VITAL SIGNS: ED Triage Vitals  Enc Vitals Group     BP 08/04/19 0938 (!) 157/83     Pulse Rate 08/04/19 0938 88     Resp 08/04/19 0938 16     Temp 08/04/19 0938 97.7 F (  36.5 C)     Temp Source 08/04/19 0938 Oral     SpO2 08/04/19 0938 96 %     Weight 08/04/19 0945 185 lb (83.9 kg)     Height 08/04/19 0945 5\' 7"  (1.702 m)     Head Circumference --      Peak Flow --      Pain Score 08/04/19 0945 0     Pain Loc --      Pain Edu? --      Excl. in [place]? --     Constitutional: Alert and oriented. Well appearing and in no acute distress. Eyes: Conjunctivae are normal. PERRL. EOMI. Head: Atraumatic. Neck: No stridor.   Cardiovascular: Normal rate, regular rhythm. Grossly normal heart sounds.  Good peripheral circulation. Respiratory: Normal respiratory effort.  No retractions. Lungs CTAB. Neurologic:  Normal speech and language. No gross  focal neurologic deficits are appreciated. No gait instability. Skin:  Skin is warm, dry.  Abscess is open and continues to drain.  There is minimal swelling and no surrounding cellulitis present. Psychiatric: Mood and affect are normal. Speech and behavior are normal.  ____________________________________________   LABS (all labs ordered are listed, but only abnormal results are displayed)  Labs Reviewed - No data to display  PROCEDURES  Procedure(s) performed (including Critical Care):  Procedures   ____________________________________________   INITIAL IMPRESSION / ASSESSMENT AND PLAN / ED COURSE  As part of my medical decision making, I reviewed the following data within the electronic MEDICAL RECORD NUMBER Notes from prior ED visits and [place] Controlled Substance Database  64 year old male presents to the ED after having a abscess open to his left foot.  He continues to take the antibiotic without any difficulty.  He states that his foot is feeling much better and he is able to walk without pain.  There is still some minimal drainage on today's exam.  Patient is encouraged to start soaking his foot in warm water.  He was also encouraged to make an appointment with Dr. Cleda Mccreedy who is the podiatrist on call for congenital clinic.  He is aware that he should return to the ED if any severe worsening of his symptoms such as fever, chills, increased pain in his foot since he is diabetic.  ____________________________________________   FINAL CLINICAL IMPRESSION(S) / ED DIAGNOSES  Final diagnoses:  Wound check, abscess     ED Discharge Orders    None       Note:  This document was prepared using Dragon voice recognition software and may include unintentional dictation errors.    Johnn Hai, PA-C 08/04/19 1200    Merlyn Lot, MD 08/04/19 [phone]

## 2019-08-04 NOTE — ED Triage Notes (Signed)
Patient presents to the ED with a wound to his left foot that was seen several days ago.  Patient states he was told to return to the ED in a few days to have wound checked.  Patient has diabetes.  Patient denies any pain.  Patient is in no obvious distress at this time.

## 2019-08-04 NOTE — ED Notes (Signed)
See triage note  Presents for wound check  States he feel a lot better  Wound is still draining

## 2019-08-04 NOTE — Discharge Instructions (Addendum)
Follow-up with your primary care provider if any continued problems.  Also the name of a podiatrist was listed on your discharge papers.  This is a foot specialist and you should call and make an appointment.  Dr. Cleda Mccreedy is located in the [facility].  Also begin using warm moist compresses to your foot are also soaking your foot in warm water.  Currently your foot is still draining some and this will help increase drainage.  Continue taking your antibiotics until completely finished.  Return to the emergency department if any severe worsening of your symptoms.

## 2020-03-14 ENCOUNTER — Encounter: Admission: EM | Disposition: E | Payer: Self-pay | Source: Home / Self Care | Attending: General Surgery

## 2020-03-14 ENCOUNTER — Encounter: Payer: Self-pay | Admitting: Emergency Medicine

## 2020-03-14 ENCOUNTER — Emergency Department: Payer: BC Managed Care – PPO | Admitting: Anesthesiology

## 2020-03-14 ENCOUNTER — Emergency Department: Payer: BC Managed Care – PPO

## 2020-03-14 ENCOUNTER — Inpatient Hospital Stay
Admission: EM | Admit: 2020-03-14 | Discharge: 2020-04-13 | DRG: 853 | Disposition: E | Payer: BC Managed Care – PPO | Attending: Internal Medicine | Admitting: Internal Medicine

## 2020-03-14 ENCOUNTER — Other Ambulatory Visit: Payer: Self-pay

## 2020-03-14 ENCOUNTER — Inpatient Hospital Stay: Payer: BC Managed Care – PPO

## 2020-03-14 DIAGNOSIS — E46 Unspecified protein-calorie malnutrition: Secondary | ICD-10-CM | POA: Diagnosis present

## 2020-03-14 DIAGNOSIS — Z96642 Presence of left artificial hip joint: Secondary | ICD-10-CM | POA: Diagnosis present

## 2020-03-14 DIAGNOSIS — I469 Cardiac arrest, cause unspecified: Secondary | ICD-10-CM | POA: Diagnosis not present

## 2020-03-14 DIAGNOSIS — I5020 Unspecified systolic (congestive) heart failure: Secondary | ICD-10-CM | POA: Diagnosis not present

## 2020-03-14 DIAGNOSIS — I10 Essential (primary) hypertension: Secondary | ICD-10-CM | POA: Diagnosis not present

## 2020-03-14 DIAGNOSIS — K660 Peritoneal adhesions (postprocedural) (postinfection): Secondary | ICD-10-CM | POA: Diagnosis present

## 2020-03-14 DIAGNOSIS — I5021 Acute systolic (congestive) heart failure: Secondary | ICD-10-CM | POA: Diagnosis not present

## 2020-03-14 DIAGNOSIS — N179 Acute kidney failure, unspecified: Secondary | ICD-10-CM | POA: Diagnosis present

## 2020-03-14 DIAGNOSIS — E1165 Type 2 diabetes mellitus with hyperglycemia: Secondary | ICD-10-CM | POA: Diagnosis not present

## 2020-03-14 DIAGNOSIS — E870 Hyperosmolality and hypernatremia: Secondary | ICD-10-CM | POA: Diagnosis not present

## 2020-03-14 DIAGNOSIS — G92 Toxic encephalopathy: Secondary | ICD-10-CM | POA: Diagnosis not present

## 2020-03-14 DIAGNOSIS — A419 Sepsis, unspecified organism: Secondary | ICD-10-CM | POA: Diagnosis not present

## 2020-03-14 DIAGNOSIS — E1169 Type 2 diabetes mellitus with other specified complication: Secondary | ICD-10-CM | POA: Diagnosis not present

## 2020-03-14 DIAGNOSIS — I429 Cardiomyopathy, unspecified: Secondary | ICD-10-CM | POA: Diagnosis present

## 2020-03-14 DIAGNOSIS — K567 Ileus, unspecified: Secondary | ICD-10-CM | POA: Diagnosis not present

## 2020-03-14 DIAGNOSIS — K558 Other vascular disorders of intestine: Secondary | ICD-10-CM | POA: Diagnosis not present

## 2020-03-14 DIAGNOSIS — K631 Perforation of intestine (nontraumatic): Secondary | ICD-10-CM | POA: Diagnosis present

## 2020-03-14 DIAGNOSIS — A4189 Other specified sepsis: Secondary | ICD-10-CM | POA: Diagnosis present

## 2020-03-14 DIAGNOSIS — R9431 Abnormal electrocardiogram [ECG] [EKG]: Secondary | ICD-10-CM | POA: Diagnosis not present

## 2020-03-14 DIAGNOSIS — Z4659 Encounter for fitting and adjustment of other gastrointestinal appliance and device: Secondary | ICD-10-CM

## 2020-03-14 DIAGNOSIS — R0602 Shortness of breath: Secondary | ICD-10-CM | POA: Diagnosis not present

## 2020-03-14 DIAGNOSIS — K668 Other specified disorders of peritoneum: Secondary | ICD-10-CM | POA: Diagnosis not present

## 2020-03-14 DIAGNOSIS — E119 Type 2 diabetes mellitus without complications: Secondary | ICD-10-CM

## 2020-03-14 DIAGNOSIS — C185 Malignant neoplasm of splenic flexure: Secondary | ICD-10-CM | POA: Diagnosis present

## 2020-03-14 DIAGNOSIS — C186 Malignant neoplasm of descending colon: Secondary | ICD-10-CM | POA: Diagnosis not present

## 2020-03-14 DIAGNOSIS — J9601 Acute respiratory failure with hypoxia: Secondary | ICD-10-CM | POA: Diagnosis not present

## 2020-03-14 DIAGNOSIS — E860 Dehydration: Secondary | ICD-10-CM | POA: Diagnosis present

## 2020-03-14 DIAGNOSIS — E1159 Type 2 diabetes mellitus with other circulatory complications: Secondary | ICD-10-CM | POA: Diagnosis not present

## 2020-03-14 DIAGNOSIS — Z7984 Long term (current) use of oral hypoglycemic drugs: Secondary | ICD-10-CM

## 2020-03-14 DIAGNOSIS — R6521 Severe sepsis with septic shock: Secondary | ICD-10-CM | POA: Diagnosis present

## 2020-03-14 DIAGNOSIS — C772 Secondary and unspecified malignant neoplasm of intra-abdominal lymph nodes: Secondary | ICD-10-CM | POA: Diagnosis present

## 2020-03-14 DIAGNOSIS — J9602 Acute respiratory failure with hypercapnia: Secondary | ICD-10-CM | POA: Diagnosis not present

## 2020-03-14 DIAGNOSIS — Z20822 Contact with and (suspected) exposure to covid-19: Secondary | ICD-10-CM | POA: Diagnosis present

## 2020-03-14 DIAGNOSIS — A4151 Sepsis due to Escherichia coli [E. coli]: Principal | ICD-10-CM | POA: Diagnosis present

## 2020-03-14 DIAGNOSIS — Z79899 Other long term (current) drug therapy: Secondary | ICD-10-CM

## 2020-03-14 DIAGNOSIS — D696 Thrombocytopenia, unspecified: Secondary | ICD-10-CM | POA: Diagnosis not present

## 2020-03-14 DIAGNOSIS — L89152 Pressure ulcer of sacral region, stage 2: Secondary | ICD-10-CM | POA: Diagnosis not present

## 2020-03-14 DIAGNOSIS — J4540 Moderate persistent asthma, uncomplicated: Secondary | ICD-10-CM | POA: Diagnosis present

## 2020-03-14 DIAGNOSIS — I4510 Unspecified right bundle-branch block: Secondary | ICD-10-CM | POA: Diagnosis present

## 2020-03-14 DIAGNOSIS — I110 Hypertensive heart disease with heart failure: Secondary | ICD-10-CM | POA: Diagnosis present

## 2020-03-14 DIAGNOSIS — K5939 Other megacolon: Secondary | ICD-10-CM | POA: Diagnosis present

## 2020-03-14 DIAGNOSIS — K658 Other peritonitis: Secondary | ICD-10-CM | POA: Diagnosis present

## 2020-03-14 DIAGNOSIS — L8990 Pressure ulcer of unspecified site, unspecified stage: Secondary | ICD-10-CM | POA: Insufficient documentation

## 2020-03-14 DIAGNOSIS — J9600 Acute respiratory failure, unspecified whether with hypoxia or hypercapnia: Secondary | ICD-10-CM

## 2020-03-14 DIAGNOSIS — Z87891 Personal history of nicotine dependence: Secondary | ICD-10-CM

## 2020-03-14 DIAGNOSIS — Z452 Encounter for adjustment and management of vascular access device: Secondary | ICD-10-CM

## 2020-03-14 DIAGNOSIS — I420 Dilated cardiomyopathy: Secondary | ICD-10-CM | POA: Diagnosis not present

## 2020-03-14 HISTORY — PX: LAPAROTOMY: SHX154

## 2020-03-14 LAB — COMPREHENSIVE METABOLIC PANEL
ALT: 12 U/L (ref 0–44)
AST: 27 U/L (ref 15–41)
Albumin: 3 g/dL — ABNORMAL LOW (ref 3.5–5.0)
Alkaline Phosphatase: 57 U/L (ref 38–126)
Anion gap: 17 — ABNORMAL HIGH (ref 5–15)
BUN: 37 mg/dL — ABNORMAL HIGH (ref 8–23)
CO2: 21 mmol/L — ABNORMAL LOW (ref 22–32)
Calcium: 8.8 mg/dL — ABNORMAL LOW (ref 8.9–10.3)
Chloride: 101 mmol/L (ref 98–111)
Creatinine, Ser: 3.27 mg/dL — ABNORMAL HIGH (ref 0.61–1.24)
GFR calc Af Amer: 22 mL/min — ABNORMAL LOW (ref >60)
GFR calc non Af Amer: 19 mL/min — ABNORMAL LOW (ref >60)
Glucose, Bld: 300 mg/dL — ABNORMAL HIGH (ref 70–99)
Potassium: 3.8 mmol/L (ref 3.5–5.1)
Sodium: 139 mmol/L (ref 135–145)
Total Bilirubin: 1.1 mg/dL (ref 0.3–1.2)
Total Protein: 6.2 g/dL — ABNORMAL LOW (ref 6.5–8.1)

## 2020-03-14 LAB — BLOOD GAS, ARTERIAL
Acid-base deficit: 7.4 mmol/L — ABNORMAL HIGH (ref 0.0–2.0)
Bicarbonate: 19.3 mmol/L — ABNORMAL LOW (ref 20.0–28.0)
FIO2: 1
MECHVT: 500 mL
O2 Saturation: 98 %
PEEP: 5 cmH2O
Patient temperature: 37
RATE: 20 resp/min
pCO2 arterial: 43 mmHg (ref 32.0–48.0)
pH, Arterial: 7.26 — ABNORMAL LOW (ref 7.350–7.450)
pO2, Arterial: 119 mmHg — ABNORMAL HIGH (ref 83.0–108.0)

## 2020-03-14 LAB — BLOOD GAS, VENOUS
Acid-base deficit: 3.8 mmol/L — ABNORMAL HIGH (ref 0.0–2.0)
Bicarbonate: 20.8 mmol/L (ref 20.0–28.0)
O2 Saturation: 81.2 %
Patient temperature: 37
pCO2, Ven: 36 mmHg — ABNORMAL LOW (ref 44.0–60.0)
pH, Ven: 7.37 (ref 7.250–7.430)
pO2, Ven: 47 mmHg — ABNORMAL HIGH (ref 32.0–45.0)

## 2020-03-14 LAB — CBC WITH DIFFERENTIAL/PLATELET
Abs Immature Granulocytes: 0 10*3/uL (ref 0.00–0.07)
Basophils Absolute: 0 10*3/uL (ref 0.0–0.1)
Basophils Relative: 1 %
Eosinophils Absolute: 0 10*3/uL (ref 0.0–0.5)
Eosinophils Relative: 0 %
HCT: 42.9 % (ref 39.0–52.0)
Hemoglobin: 14 g/dL (ref 13.0–17.0)
Immature Granulocytes: 0 %
Lymphocytes Relative: 12 %
Lymphs Abs: 0.7 10*3/uL (ref 0.7–4.0)
MCH: 29.4 pg (ref 26.0–34.0)
MCHC: 32.6 g/dL (ref 30.0–36.0)
MCV: 89.9 fL (ref 80.0–100.0)
Monocytes Absolute: 0.5 10*3/uL (ref 0.1–1.0)
Monocytes Relative: 9 %
Neutro Abs: 4.5 10*3/uL (ref 1.7–7.7)
Neutrophils Relative %: 78 %
Platelets: 283 10*3/uL (ref 150–400)
RBC: 4.77 MIL/uL (ref 4.22–5.81)
RDW: 12.7 % (ref 11.5–15.5)
Smear Review: NORMAL
WBC: 5.8 10*3/uL (ref 4.0–10.5)
nRBC: 0 % (ref 0.0–0.2)

## 2020-03-14 LAB — APTT: aPTT: 28 seconds (ref 24–36)

## 2020-03-14 LAB — PROTIME-INR
INR: 1.1 (ref 0.8–1.2)
Prothrombin Time: 13.6 seconds (ref 11.4–15.2)

## 2020-03-14 LAB — MRSA PCR SCREENING: MRSA by PCR: NEGATIVE

## 2020-03-14 LAB — POCT I-STAT CREATININE: Creatinine, Ser: 3.5 mg/dL — ABNORMAL HIGH (ref 0.61–1.24)

## 2020-03-14 LAB — TYPE AND SCREEN
ABO/RH(D): O POS
Antibody Screen: NEGATIVE

## 2020-03-14 LAB — BRAIN NATRIURETIC PEPTIDE: B Natriuretic Peptide: 99.9 pg/mL (ref 0.0–100.0)

## 2020-03-14 LAB — LIPASE, BLOOD: Lipase: 35 U/L (ref 11–51)

## 2020-03-14 LAB — GLUCOSE, CAPILLARY
Glucose-Capillary: 258 mg/dL — ABNORMAL HIGH (ref 70–99)
Glucose-Capillary: 261 mg/dL — ABNORMAL HIGH (ref 70–99)
Glucose-Capillary: 271 mg/dL — ABNORMAL HIGH (ref 70–99)

## 2020-03-14 LAB — LACTIC ACID, PLASMA
Lactic Acid, Venous: 6.9 mmol/L (ref 0.5–1.9)
Lactic Acid, Venous: 4.4 mmol/L (ref 0.5–1.9)
Lactic Acid, Venous: 6.2 mmol/L (ref 0.5–1.9)

## 2020-03-14 LAB — SARS CORONAVIRUS 2 BY RT PCR (HOSPITAL ORDER, PERFORMED IN ~~LOC~~ HOSPITAL LAB): SARS Coronavirus 2: NEGATIVE

## 2020-03-14 LAB — TROPONIN I (HIGH SENSITIVITY): Troponin I (High Sensitivity): 33 ng/L — ABNORMAL HIGH (ref <18)

## 2020-03-14 SURGERY — LAPAROTOMY, EXPLORATORY
Anesthesia: General | Site: Abdomen

## 2020-03-14 MED ORDER — NOREPINEPHRINE BITARTRATE 1 MG/ML IV SOLN
INTRAVENOUS | Status: DC | PRN
  Administered 2020-03-14 (×3): 1 mL via INTRAVENOUS

## 2020-03-14 MED ORDER — ACETAMINOPHEN 325 MG PO TABS
650.0000 mg | ORAL_TABLET | Freq: Four times a day (QID) | ORAL | Status: DC | PRN
  Administered 2020-03-19: 650 mg via ORAL
  Filled 2020-03-14: qty 2

## 2020-03-14 MED ORDER — PIPERACILLIN-TAZOBACTAM 3.375 G IVPB 30 MIN
3.3750 g | INTRAVENOUS | Status: AC
  Administered 2020-03-14: 3.375 g via INTRAVENOUS
  Filled 2020-03-14: qty 50

## 2020-03-14 MED ORDER — NOREPINEPHRINE 4 MG/250ML-% IV SOLN: 0.0000 ug/min | INTRAVENOUS | Status: DC

## 2020-03-14 MED ORDER — ROCURONIUM BROMIDE 100 MG/10ML IV SOLN
INTRAVENOUS | Status: DC | PRN
  Administered 2020-03-14 (×2): 50 mg via INTRAVENOUS

## 2020-03-14 MED ORDER — FENTANYL 2500MCG IN NS 250ML (10MCG/ML) PREMIX INFUSION
INTRAVENOUS | Status: AC
  Filled 2020-03-14: qty 250

## 2020-03-14 MED ORDER — PROPOFOL 10 MG/ML IV BOLUS
INTRAVENOUS | Status: AC
  Filled 2020-03-14: qty 20

## 2020-03-14 MED ORDER — FENTANYL CITRATE (PF) 100 MCG/2ML IJ SOLN
50.0000 ug | Freq: Once | INTRAMUSCULAR | Status: AC
  Administered 2020-03-14: 50 ug via INTRAVENOUS

## 2020-03-14 MED ORDER — MIDAZOLAM HCL 2 MG/2ML IJ SOLN: 4.0000 mg | Freq: Once | INTRAMUSCULAR | Status: AC

## 2020-03-14 MED ORDER — BUPIVACAINE-EPINEPHRINE (PF) 0.5% -1:200000 IJ SOLN
INTRAMUSCULAR | Status: DC | PRN
  Administered 2020-03-14: 30 mL via PERINEURAL

## 2020-03-14 MED ORDER — ONDANSETRON HCL 4 MG/2ML IJ SOLN
4.0000 mg | Freq: Once | INTRAMUSCULAR | Status: AC
  Administered 2020-03-14: 4 mg via INTRAVENOUS
  Filled 2020-03-14: qty 2

## 2020-03-14 MED ORDER — VISTASEAL 10 ML SINGLE DOSE KIT
PACK | CUTANEOUS | Status: DC | PRN
  Administered 2020-03-14: 10 mL via TOPICAL

## 2020-03-14 MED ORDER — LACTATED RINGERS IV SOLN: INTRAVENOUS | Status: DC | PRN

## 2020-03-14 MED ORDER — PROPOFOL 500 MG/50ML IV EMUL
INTRAVENOUS | Status: AC
  Filled 2020-03-14: qty 50

## 2020-03-14 MED ORDER — HYDROMORPHONE HCL 1 MG/ML IJ SOLN
INTRAMUSCULAR | Status: AC
Start: 2020-03-14 — End: ?
  Filled 2020-03-14: qty 1

## 2020-03-14 MED ORDER — SODIUM CHLORIDE 0.9 % IV SOLN
INTRAVENOUS | Status: DC | PRN
  Administered 2020-03-14: 60 mL

## 2020-03-14 MED ORDER — ONDANSETRON HCL 4 MG/2ML IJ SOLN
4.0000 mg | Freq: Four times a day (QID) | INTRAMUSCULAR | Status: DC | PRN
  Administered 2020-03-14: 4 mg via INTRAVENOUS

## 2020-03-14 MED ORDER — SODIUM CHLORIDE 0.9 % IV BOLUS
1000.0000 mL | Freq: Once | INTRAVENOUS | Status: AC
  Administered 2020-03-14: 1000 mL via INTRAVENOUS

## 2020-03-14 MED ORDER — LACTATED RINGERS IV BOLUS
1000.0000 mL | Freq: Once | INTRAVENOUS | Status: AC
  Administered 2020-03-14: 1000 mL via INTRAVENOUS

## 2020-03-14 MED ORDER — HEPARIN SODIUM (PORCINE) 5000 UNIT/ML IJ SOLN
5000.0000 [IU] | Freq: Three times a day (TID) | INTRAMUSCULAR | Status: DC
  Administered 2020-03-14 – 2020-03-20 (×18): 5000 [IU] via SUBCUTANEOUS
  Filled 2020-03-14 (×18): qty 1

## 2020-03-14 MED ORDER — METOPROLOL TARTRATE 25 MG PO TABS
12.5000 mg | ORAL_TABLET | Freq: Every day | ORAL | Status: DC
  Administered 2020-03-19 – 2020-03-21 (×3): 12.5 mg via ORAL
  Filled 2020-03-14 (×4): qty 1

## 2020-03-14 MED ORDER — SODIUM CHLORIDE 0.9 % IV BOLUS
500.0000 mL | Freq: Once | INTRAVENOUS | Status: AC
  Administered 2020-03-14: 500 mL via INTRAVENOUS

## 2020-03-14 MED ORDER — MORPHINE SULFATE (PF) 4 MG/ML IV SOLN
4.0000 mg | Freq: Once | INTRAVENOUS | Status: DC
  Filled 2020-03-14: qty 1

## 2020-03-14 MED ORDER — FENTANYL CITRATE (PF) 100 MCG/2ML IJ SOLN
INTRAMUSCULAR | Status: DC | PRN
Start: 2020-03-14 — End: 2020-03-14
  Administered 2020-03-14: 100 ug via INTRAVENOUS

## 2020-03-14 MED ORDER — FENTANYL CITRATE (PF) 250 MCG/5ML IJ SOLN
INTRAMUSCULAR | Status: AC
  Filled 2020-03-14: qty 5

## 2020-03-14 MED ORDER — ALBUMIN HUMAN 5 % IV SOLN
INTRAVENOUS | Status: AC
  Filled 2020-03-14: qty 500

## 2020-03-14 MED ORDER — SODIUM CHLORIDE 0.9 % IV SOLN: INTRAVENOUS | Status: DC

## 2020-03-14 MED ORDER — VASOPRESSIN 20 UNIT/ML IV SOLN
INTRAVENOUS | Status: DC | PRN
  Administered 2020-03-14: 1 [IU] via INTRAVENOUS
  Administered 2020-03-14: 2 [IU] via INTRAVENOUS
  Administered 2020-03-14 (×2): 1 [IU] via INTRAVENOUS
  Administered 2020-03-14: 2 [IU] via INTRAVENOUS
  Administered 2020-03-14: 1 [IU] via INTRAVENOUS
  Administered 2020-03-14: 2 [IU] via INTRAVENOUS
  Administered 2020-03-14: 1 [IU] via INTRAVENOUS

## 2020-03-14 MED ORDER — ACETAMINOPHEN 650 MG RE SUPP
650.0000 mg | Freq: Four times a day (QID) | RECTAL | Status: DC | PRN
  Administered 2020-03-15 – 2020-03-17 (×3): 650 mg via RECTAL
  Filled 2020-03-14 (×3): qty 1

## 2020-03-14 MED ORDER — SODIUM BICARBONATE 8.4 % IV SOLN
INTRAVENOUS | Status: AC
  Filled 2020-03-14: qty 50

## 2020-03-14 MED ORDER — MIDAZOLAM HCL 2 MG/2ML IJ SOLN
2.0000 mg | INTRAMUSCULAR | Status: DC | PRN
  Administered 2020-03-14 – 2020-03-17 (×4): 2 mg via INTRAVENOUS
  Filled 2020-03-14 (×3): qty 2

## 2020-03-14 MED ORDER — FENTANYL CITRATE (PF) 100 MCG/2ML IJ SOLN
INTRAMUSCULAR | Status: AC
  Filled 2020-03-14: qty 2

## 2020-03-14 MED ORDER — MIDAZOLAM HCL 2 MG/2ML IJ SOLN
INTRAMUSCULAR | Status: AC
  Filled 2020-03-14: qty 4

## 2020-03-14 MED ORDER — VISTASEAL 10 ML SINGLE DOSE KIT
PACK | CUTANEOUS | Status: AC
  Filled 2020-03-14: qty 10

## 2020-03-14 MED ORDER — PIPERACILLIN-TAZOBACTAM 3.375 G IVPB
3.3750 g | Freq: Two times a day (BID) | INTRAVENOUS | Status: DC
  Administered 2020-03-14: 3.375 g via INTRAVENOUS
  Filled 2020-03-14: qty 50

## 2020-03-14 MED ORDER — PROPOFOL 10 MG/ML IV BOLUS
INTRAVENOUS | Status: DC | PRN
  Administered 2020-03-14: 110 mg via INTRAVENOUS

## 2020-03-14 MED ORDER — HYDROMORPHONE HCL 1 MG/ML IJ SOLN
INTRAMUSCULAR | Status: DC | PRN
Start: 2020-03-14 — End: 2020-03-14
  Administered 2020-03-14: .5 mg via INTRAVENOUS

## 2020-03-14 MED ORDER — NOREPINEPHRINE BITARTRATE 1 MG/ML IV SOLN
INTRAVENOUS | Status: AC
  Filled 2020-03-14: qty 4

## 2020-03-14 MED ORDER — PIPERACILLIN-TAZOBACTAM 3.375 G IVPB: 3.3750 g | Freq: Three times a day (TID) | INTRAVENOUS | Status: DC

## 2020-03-14 MED ORDER — SODIUM BICARBONATE 8.4 % IV SOLN
50.0000 meq | Freq: Once | INTRAVENOUS | Status: AC
  Administered 2020-03-14: 50 meq via INTRAVENOUS
  Filled 2020-03-14: qty 50

## 2020-03-14 MED ORDER — MIDAZOLAM HCL 2 MG/2ML IJ SOLN
INTRAMUSCULAR | Status: AC
  Filled 2020-03-14: qty 2

## 2020-03-14 MED ORDER — SODIUM CHLORIDE (PF) 0.9 % IJ SOLN
INTRAMUSCULAR | Status: AC
  Filled 2020-03-14: qty 50

## 2020-03-14 MED ORDER — SODIUM BICARBONATE 8.4 % IV SOLN
INTRAVENOUS | Status: DC | PRN
  Administered 2020-03-14: 50 meq via INTRAVENOUS

## 2020-03-14 MED ORDER — HYDROCODONE-ACETAMINOPHEN 5-325 MG PO TABS
1.0000 | ORAL_TABLET | ORAL | Status: DC | PRN
  Administered 2020-03-20: 2 via ORAL
  Filled 2020-03-14: qty 2

## 2020-03-14 MED ORDER — PHENYLEPHRINE HCL (PRESSORS) 10 MG/ML IV SOLN
INTRAVENOUS | Status: DC | PRN
  Administered 2020-03-14 (×3): 100 ug via INTRAVENOUS

## 2020-03-14 MED ORDER — ENOXAPARIN SODIUM 40 MG/0.4ML ~~LOC~~ SOLN: 40.0000 mg | SUBCUTANEOUS | Status: DC

## 2020-03-14 MED ORDER — NOREPINEPHRINE 16 MG/250ML-% IV SOLN
0.0000 ug/min | INTRAVENOUS | Status: DC
  Administered 2020-03-14: 6 ug/min via INTRAVENOUS
  Filled 2020-03-14 (×2): qty 250

## 2020-03-14 MED ORDER — SODIUM CHLORIDE 0.9 % IV SOLN
INTRAVENOUS | Status: DC | PRN
  Administered 2020-03-14: 50 ug/min via INTRAVENOUS

## 2020-03-14 MED ORDER — ALBUMIN HUMAN 5 % IV SOLN: INTRAVENOUS | Status: DC | PRN

## 2020-03-14 MED ORDER — FENTANYL 2500MCG IN NS 250ML (10MCG/ML) PREMIX INFUSION
0.0000 ug/h | INTRAVENOUS | Status: DC
  Administered 2020-03-14: 50 ug/h via INTRAVENOUS
  Administered 2020-03-15: 275 ug/h via INTRAVENOUS
  Administered 2020-03-15: 250 ug/h via INTRAVENOUS
  Administered 2020-03-15: 275 ug/h via INTRAVENOUS
  Administered 2020-03-16: 200 ug/h via INTRAVENOUS
  Administered 2020-03-16: 275 ug/h via INTRAVENOUS
  Administered 2020-03-17: 250 ug/h via INTRAVENOUS
  Filled 2020-03-14 (×8): qty 250

## 2020-03-14 MED ORDER — PANTOPRAZOLE SODIUM 40 MG IV SOLR
40.0000 mg | Freq: Every day | INTRAVENOUS | Status: DC
  Administered 2020-03-14 – 2020-03-24 (×11): 40 mg via INTRAVENOUS
  Filled 2020-03-14 (×11): qty 40

## 2020-03-14 MED ORDER — VASOPRESSIN 20 UNITS/100 ML INFUSION FOR SHOCK
0.0000 [IU]/min | INTRAVENOUS | Status: DC
  Administered 2020-03-14 – 2020-03-17 (×5): 0.03 [IU]/min via INTRAVENOUS
  Filled 2020-03-14 (×6): qty 100

## 2020-03-14 MED ORDER — NOREPINEPHRINE 4 MG/250ML-% IV SOLN
INTRAVENOUS | Status: DC | PRN
  Administered 2020-03-14: 5 ug/min via INTRAVENOUS

## 2020-03-14 MED ORDER — PHENYLEPHRINE CONCENTRATED 100MG/250ML (0.4 MG/ML) INFUSION SIMPLE
0.0000 ug/min | INTRAVENOUS | Status: DC
  Administered 2020-03-14: 20 ug/min via INTRAVENOUS
  Administered 2020-03-15: 190 ug/min via INTRAVENOUS
  Administered 2020-03-15: 250 ug/min via INTRAVENOUS
  Administered 2020-03-15: 180 ug/min via INTRAVENOUS
  Filled 2020-03-14 (×5): qty 250

## 2020-03-14 MED ORDER — SUCCINYLCHOLINE CHLORIDE 20 MG/ML IJ SOLN
INTRAMUSCULAR | Status: DC | PRN
  Administered 2020-03-14: 120 mg via INTRAVENOUS

## 2020-03-14 MED ORDER — MORPHINE SULFATE (PF) 4 MG/ML IV SOLN: 4.0000 mg | INTRAVENOUS | Status: DC | PRN

## 2020-03-14 MED ORDER — DEXAMETHASONE SODIUM PHOSPHATE 10 MG/ML IJ SOLN
INTRAMUSCULAR | Status: DC | PRN
  Administered 2020-03-14: 10 mg via INTRAVENOUS

## 2020-03-14 MED ORDER — ONDANSETRON 4 MG PO TBDP
4.0000 mg | ORAL_TABLET | Freq: Four times a day (QID) | ORAL | Status: DC | PRN
  Filled 2020-03-14: qty 1

## 2020-03-14 SURGICAL SUPPLY — 38 items
CANISTER SUCT 1200ML W/VALVE (MISCELLANEOUS) ×3 IMPLANT
CHLORAPREP W/TINT 26 (MISCELLANEOUS) ×3 IMPLANT
COVER WAND RF STERILE (DRAPES) ×3 IMPLANT
DRAIN CHANNEL JP 19F (MISCELLANEOUS) ×6 IMPLANT
DRAPE LAPAROTOMY 100X77 ABD (DRAPES) ×3 IMPLANT
DRSG OPSITE POSTOP 4X10 (GAUZE/BANDAGES/DRESSINGS) ×3 IMPLANT
DRSG OPSITE POSTOP 4X8 (GAUZE/BANDAGES/DRESSINGS) ×3 IMPLANT
DRSG TEGADERM 4X10 (GAUZE/BANDAGES/DRESSINGS) ×3 IMPLANT
DRSG TELFA 3X8 NADH (GAUZE/BANDAGES/DRESSINGS) IMPLANT
ELECT REM PT RETURN 9FT ADLT (ELECTROSURGICAL) ×3
ELECTRODE REM PT RTRN 9FT ADLT (ELECTROSURGICAL) ×1 IMPLANT
GLOVE BIO SURGEON STRL SZ 6.5 (GLOVE) ×2 IMPLANT
GLOVE BIO SURGEONS STRL SZ 6.5 (GLOVE) ×1
GLOVE BIOGEL PI IND STRL 6.5 (GLOVE) ×1 IMPLANT
GLOVE BIOGEL PI INDICATOR 6.5 (GLOVE) ×2
GOWN STRL REUS W/ TWL LRG LVL3 (GOWN DISPOSABLE) ×2 IMPLANT
GOWN STRL REUS W/TWL LRG LVL3 (GOWN DISPOSABLE) ×4
KIT TURNOVER KIT A (KITS) ×3 IMPLANT
LABEL OR SOLS (LABEL) ×3 IMPLANT
LIGASURE IMPACT 36 18CM CVD LR (INSTRUMENTS) ×3 IMPLANT
NS IRRIG 1000ML POUR BTL (IV SOLUTION) ×3 IMPLANT
PACK BASIN MAJOR ARMC (MISCELLANEOUS) ×3 IMPLANT
PACK COLON CLEAN CLOSURE (MISCELLANEOUS) ×3 IMPLANT
RELOAD PROXIMATE 75MM BLUE (ENDOMECHANICALS) ×6 IMPLANT
SPONGE ABDOMINAL VAC ABTHERA (MISCELLANEOUS) ×3 IMPLANT
SPONGE LAP 18X18 RF (DISPOSABLE) ×3 IMPLANT
STAPLER PROXIMATE 75MM BLUE (STAPLE) ×3 IMPLANT
SUT ETHILON 3-0 FS-10 30 BLK (SUTURE) ×6
SUT PDS AB 1 TP1 54 (SUTURE) IMPLANT
SUT PROLENE 2 0 SH DA (SUTURE) ×3 IMPLANT
SUT SILK 2 0 (SUTURE) ×2
SUT SILK 2-0 18XBRD TIE 12 (SUTURE) ×1 IMPLANT
SUT SILK 3 0 (SUTURE) ×2
SUT SILK 3-0 18XBRD TIE 12 (SUTURE) ×1 IMPLANT
SUT VIC AB 3-0 SH 27 (SUTURE) ×4
SUT VIC AB 3-0 SH 27X BRD (SUTURE) ×2 IMPLANT
SUTURE EHLN 3-0 FS-10 30 BLK (SUTURE) ×2 IMPLANT
TRAY FOLEY MTR SLVR 16FR STAT (SET/KITS/TRAYS/PACK) ×3 IMPLANT

## 2020-03-14 NOTE — Transfer of Care (Signed)
Immediate Anesthesia Transfer of Care Note  Patient: Donald Stephenson.  Procedure(s) Performed: EXPLORATORY LAPAROTOMY SUBTOTAL COLECTOMY NEGATIVE PRESSURE DRESSING PLACEMENT  (N/A Abdomen)  Patient Location: PACU and ICU  Anesthesia Type:General  Level of Consciousness: Patient remains intubated per anesthesia plan  Airway & Oxygen Therapy: Patient remains intubated per anesthesia plan  Post-op Assessment: Report given to RN and Post -op Vital signs reviewed and stable  Post vital signs: Reviewed and stable  Last Vitals:  Vitals Value Taken Time  BP 130/60   Temp    Pulse 96 04/12/2020 1759  Resp    SpO2 99 % 04/12/2020 1759  Vitals shown include unvalidated device data.  Last Pain:  Vitals:   03/29/2020 1259  TempSrc:   PainSc: 5          Complications: No complications documented.

## 2020-03-14 NOTE — ED Triage Notes (Signed)
Pt arrives from home via [facility] w/ c/o constipation x 1 week. Per EMS pt had distended, rigid abdomen w/ bowel sounds absent. Pt took epsom salt yesterday and states he has only passed water since. Pt VS during transport BP 77/43, HR 130 BPM, RR 40'sand O2 sats 80's. Pt placed 3L [place] and sats rose to 97%.

## 2020-03-14 NOTE — ED Notes (Signed)
Pt trx to OR

## 2020-03-14 NOTE — Procedures (Signed)
Central Venous Catheter Insertion Procedure Note  Donald Stephenson  [id number]  1956-01-11  Date:03/23/2020  Time:8:16 PM   Provider Performing:Syd Newsome [name]   Procedure: Insertion of Non-tunneled Central Venous [id number]) with US guidance ([id number])   Indication(s) Medication administration  Consent Risks of the procedure as well as the alternatives and risks of each were explained to the patient and/or caregiver.  Consent for the procedure was obtained and is signed in the bedside chart  Anesthesia Topical only with 1% lidocaine   Timeout Verified patient identification, verified procedure, site/side was marked, verified correct patient position, special equipment/implants available, medications/allergies/relevant history reviewed, required imaging and test results available.  Sterile Technique Maximal sterile technique including full sterile barrier drape, hand hygiene, sterile gown, sterile gloves, mask, hair covering, sterile ultrasound probe cover (if used).  Procedure Description Area of catheter insertion was cleaned with chlorhexidine and draped in sterile fashion.  With real-time ultrasound guidance a central venous catheter was placed into the right internal jugular vein. Nonpulsatile blood flow and easy flushing noted in all ports.  The catheter was sutured in place and sterile dressing applied.  Complications/Tolerance None; patient tolerated the procedure well. Chest X-ray is ordered to verify placement for internal jugular or subclavian cannulation.   Chest x-ray is not ordered for femoral cannulation.  EBL Minimal  Specimen(s) None  Donald Stephenson, Donald Stephenson Pager [phone] (please enter 7 digits) PCCM Consult Pager [phone] (please enter 7 digits)

## 2020-03-14 NOTE — Consult Note (Signed)
Pharmacy Antibiotic Note  Donald Stephenson. is a 64 y.o. male admitted on 04/05/2020 with intestinal perforation / intra-abdominal infection. Patient went to OR for emergent exploratory laparotomy. Pharmacy has been consulted for Zosyn dosing.  Plan: Zosyn 3.375 g q12h extended infusion  Patient is technically borderline renal dose adjustment based on last SCr. Appears to be essentially anuric with only 30 mL UOP documented today. Creatinine is trending up. Will empirically decrease interval to q12h. Pharmacy will continue to monitor and adjust dosing per renal function.  Height: 5\' 8"  (172.7 cm) Weight: 86.6 kg (191 lb) IBW/kg (Calculated) : 68.4  Temp (24hrs), Avg:98.3 F (36.8 C), Min:98.3 F (36.8 C), Max:98.3 F (36.8 C)  Recent Labs  Lab 03/24/2020 1226 04/07/2020 1347  WBC 5.8  --   CREATININE 3.27* 3.50*  LATICACIDVEN 6.9*  --     Estimated Creatinine Clearance: 22.2 mL/min (A) (by C-G formula based on SCr of 3.5 mg/dL (H)).    Allergies  Allergen Reactions  . Bee Venom Swelling  . Other Hives    ants    Antimicrobials this admission: Zosyn 9/1 >>   Dose adjustments this admission: n/a  Microbiology results: 9/1 BCx: pending 9/1 OR cultures: pending 9/1 MRSA PCR: pending  Thank you for allowing pharmacy to be a part of this patient's care.  Benita Gutter 04/05/2020 6:12 PM

## 2020-03-14 NOTE — ED Provider Notes (Signed)
[facility] Emergency Department Provider Note ____________________________________________   None    (approximate)  I have reviewed the triage vital signs and the nursing notes.  HISTORY  Chief Complaint Constipation  HPI Donald Stephenson. is a 64 y.o. male history of hypertension diabetes  Patient reports that his stomach has seen swollen bloated and he has been unable to have a bowel movement for 1 week.  Today he started feeling symptoms are worsening he started to have severe feeling of bloating in his abdomen.  No vomiting.  Denies nausea.  He reports that he does not have any pain and does not wish for any pain medicine, but he rather describes it is abdomen feels so swollen and backed up and severely constipated  No headaches.  No chest pains.  Denies Covid exposures recently.  He denies feeling short of breath, but reports his belly seems so swollen he has to feel like he has to breathe harder     Past Medical History:  Diagnosis Date  . Arthritis   . Asthma    as a child-no problems since childhood  . Diabetes mellitus without complication ([place])   . Hypertension     Patient Active Problem List   Diagnosis Date Noted  . Bowel perforation ([place]) 03/28/2020  . 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 NO PAST SURGERIES    . TOTAL HIP ARTHROPLASTY Left 04/19/2019   Procedure: TOTAL HIP ARTHROPLASTY ANTERIOR APPROACH;  Surgeon: Hessie Knows, MD;  Location: [facility] ORS;  Service: Orthopedics;  Laterality: Left;    Prior to Admission medications   Medication Sig Start Date End Date Taking? Authorizing Provider  amLODipine (NORVASC) 10 MG tablet Take 10 mg by mouth every morning.    [provider]  docusate sodium (COLACE) 100 MG capsule Take 1 capsule (100 mg total) by mouth 2 (two) times daily. 04/20/19   Duanne Guess, PA-C  glimepiride (AMARYL) 4 MG tablet Take 4  mg by mouth 2 (two) times daily with a meal.    [provider]  lisinopril (ZESTRIL) 40 MG tablet Take 40 mg by mouth every morning.    [provider]  metFORMIN (GLUCOPHAGE) 1000 MG tablet Take 1,000 mg by mouth 2 (two) times daily with a meal.    [provider]  METOPROLOL SUCCINATE ER PO Take 25 mg by mouth every morning.    [provider]  Multiple Vitamin (MULTIVITAMIN) tablet Take 1 tablet by mouth daily.    [provider]  traMADol (ULTRAM) 50 MG tablet Take 1 tablet (50 mg total) by mouth every 12 (twelve) hours as needed. 08/02/19   Sable Feil, PA-C    Allergies Bee venom and Other  History reviewed. No pertinent family history.  Social History Social History   Tobacco Use  . Smoking status: Former Smoker    Years: 2.00    Types: Cigarettes    Quit date: 04/10/1969    Years since quitting: 50.9  . Smokeless tobacco: Never Used  Vaping Use  . Vaping Use: Never used  Substance Use Topics  . Alcohol use: Yes    Comment: 2-3 shots and 1 beer daily  . Drug use: Yes    Frequency: 5.0 times per week    Types: Marijuana    Review of Systems Constitutional: No fever/chills Eyes: No visual changes. ENT: No sore throat. Cardiovascular: Denies chest pain. Respiratory: Denies  shortness of breath. Gastrointestinal: See HPI Genitourinary: Negative for dysuria. Musculoskeletal: Negative for back pain. Skin: Negative for rash. Neurological: Negative for headaches or weakness.    ____________________________________________   PHYSICAL EXAM:  VITAL SIGNS: ED Triage Vitals  Enc Vitals Group     BP --      Pulse Rate 04/06/2020 1227 (!) 110     Resp 03/31/2020 1227 (!) 38     Temp 03/27/2020 1227 98.3 F (36.8 C)     Temp Source 03/23/2020 1227 Oral     SpO2 03/17/2020 1227 95 %     Weight 04/02/2020 1229 191 lb (86.6 kg)     Height 03/29/2020 1229 5\' 8"  (1.727 m)     Head Circumference --      Peak Flow --      Pain Score  03/27/2020 1229 0     Pain Loc --      Pain Edu? --      Excl. in [place]? --     Constitutional: Alert and oriented.  Presents appearing moderately ill, tachypneic, distended abdomen. Eyes: Conjunctivae are normal. Head: Atraumatic. Nose: No congestion/rhinnorhea. Mouth/Throat: Mucous membranes are moist. Neck: No stridor.  Cardiovascular: Normal rate, regular rhythm. Grossly normal heart sounds.  Good peripheral circulation. Respiratory: Normal respiratory effort.  No retractions. Lungs CTAB. Gastrointestinal: Soft and nontender.  There is notable distention throughout.  Tympanic.  No rebound or pain to percussion.  Rapid bedside fast ultrasound exam performed, no free fluid in the abdomen noted.  Notable bowel gas obscures the imaging in many areas, unable to identify the abdominal aorta due to overlying gas.  Musculoskeletal: No lower extremity tenderness nor edema. Neurologic:  Normal speech and language. No gross focal neurologic deficits are appreciated.  Skin:  Skin is warm, dry and intact. No rash noted. Psychiatric: Mood and affect are slightly anxious. Speech and behavior are normal.  ____________________________________________   LABS (all labs ordered are listed, but only abnormal results are displayed)  Labs Reviewed  BLOOD GAS, VENOUS - Abnormal; Notable for the following components:      Result Value   pCO2, Ven 36 (*)    pO2, Ven 47.0 (*)    Acid-base deficit 3.8 (*)    All other components within normal limits  LACTIC ACID, PLASMA - Abnormal; Notable for the following components:   Lactic Acid, Venous 6.9 (*)    All other components within normal limits  COMPREHENSIVE METABOLIC PANEL - Abnormal; Notable for the following components:   CO2 21 (*)    Glucose, Bld 300 (*)    BUN 37 (*)    Creatinine, Ser 3.27 (*)    Calcium 8.8 (*)    Total Protein 6.2 (*)    Albumin 3.0 (*)    GFR calc non Af Amer 19 (*)    GFR calc Af Amer 22 (*)    Anion gap 17 (*)    All  other components within normal limits  GLUCOSE, CAPILLARY - Abnormal; Notable for the following components:   Glucose-Capillary 258 (*)    All other components within normal limits  POCT I-STAT CREATININE - Abnormal; Notable for the following components:   Creatinine, Ser 3.50 (*)    All other components within normal limits  TROPONIN I (HIGH SENSITIVITY) - Abnormal; Notable for the following components:   Troponin I (High Sensitivity) 33 (*)    All other components within normal limits  SARS CORONAVIRUS 2 BY RT PCR (HOSPITAL ORDER, [facility])  CULTURE, BLOOD (SINGLE)  CBC WITH DIFFERENTIAL/PLATELET  PROTIME-INR  APTT  LIPASE, BLOOD  BRAIN NATRIURETIC PEPTIDE  LACTIC ACID, PLASMA  URINALYSIS, COMPLETE (UACMP) WITH MICROSCOPIC  HIV ANTIBODY (ROUTINE TESTING W REFLEX)  CBG MONITORING, ED  I-STAT CREATININE (MANUAL ENTRY)  TYPE AND SCREEN   ____________________________________________  EKG  Reviewed and interpreted at 1230 Heart rate 120 QRS 80 QTc 430 Sinus tachycardia, no evidence of acute ischemia ____________________________________________  RADIOLOGY  CT ABDOMEN PELVIS WO CONTRAST  Result Date: 04/06/2020 CLINICAL DATA:  64 year old male with history of abdominal pain. Suspected aortic dissection. EXAM: CT CHEST, ABDOMEN AND PELVIS WITHOUT CONTRAST TECHNIQUE: Multidetector CT imaging of the chest, abdomen and pelvis was performed following the standard protocol without IV contrast. COMPARISON:  No priors. FINDINGS: Comment: Today's study is limited for detection and characterization of visceral and/or vascular lesions by lack of IV contrast material. CT CHEST FINDINGS Cardiovascular: Heart size is normal. There is no significant pericardial fluid, thickening or pericardial calcification. Small amount of gas is present in the right ventricle and pulmonic trunk. There is aortic atherosclerosis, as well as atherosclerosis of the great vessels of the  mediastinum and the coronary arteries, including calcified atherosclerotic plaque in the left anterior descending coronary artery. No crescentic high attenuation associated with the wall of the thoracic aorta to suggest acute intramural hemorrhage. Small amount of gas also noted in the left axillary vein. Mediastinum/Nodes: No pathologically enlarged mediastinal or hilar lymph nodes. Esophagus is unremarkable in appearance. No axillary lymphadenopathy. Lungs/Pleura: No suspicious appearing pulmonary nodules or masses are noted. No acute consolidative airspace disease. No pleural effusions. Linear areas of scarring and/or subsegmental atelectasis are noted in the dependent portions of the lungs bilaterally. Musculoskeletal: There are no aggressive appearing lytic or blastic lesions noted in the visualized portions of the skeleton. CT ABDOMEN PELVIS FINDINGS Hepatobiliary: Mild diffuse low attenuation throughout the hepatic parenchyma, indicative of hepatic steatosis. No definite discrete cystic or solid hepatic lesions are confidently identified on today's noncontrast CT examination. Unenhanced appearance of the gallbladder is normal. Pancreas: No definite pancreatic mass or peripancreatic fluid collections or inflammatory changes are noted on today's noncontrast CT examination. Spleen: Unremarkable. Adrenals/Urinary Tract: 1.7 cm low-attenuation lesion in the medial aspect of the lower pole of the left kidney, incompletely characterized on today's non-contrast CT examination, but statistically likely to represent a cyst. Right kidney and bilateral adrenal glands are normal in appearance. No hydroureteronephrosis. Urinary bladder is normal in appearance. Stomach/Bowel: Normal appearance of the stomach. Multiple prominent borderline dilated loops of small bowel in the left side of the abdomen measuring up to 3.1 cm in diameter with some subjective wall thickening in the region of the jejunum and small air-fluid  levels. Moderate distension of the colon with fluid and gas. Potential pneumatosis in the region of the cecum best appreciated on axial image 82 of series 508 and coronal image 65 of series 511. Appendix appears dilated measuring up to 1.4 cm in diameter. Vascular/Lymphatic: Aortic atherosclerosis. No lymphadenopathy noted in the abdomen or pelvis. Reproductive: Prostate gland and seminal vesicles are unremarkable in appearance. Other: Large volume of pneumoperitoneum. In addition, there are some unusual sites of extraluminal gas most notably in the low anatomic pelvis encircling the mesorectum, and in the spaces adjacent to the seminal vesicles, some of which may potentially be within venous structures. Small volume of ascites. Musculoskeletal: There are no aggressive appearing lytic or blastic lesions noted in the visualized portions of the skeleton. IMPRESSION: 1.  Large amount of pneumoperitoneum indicative of bowel perforation. The exact site of perforation is uncertain on today's examination, however, the favored candidate sites for perforation include the cecum where there is potential pneumatosis, and the rectosigmoid region where there appears to be a small amount of gas in the veins associated with the seminal vesicles and a small amount of gas encircling the mesorectum. Surgical consultation is strongly recommended. 2. Gas right ventricle and pulmonic trunk, as well as the left axillary vein. Findings are favored to be iatrogenic related to IV access. However, there may be some venous gas in the low anatomic pelvis, as discussed above. The possibility of ischemic bowel should be considered, and correlation with lactate levels is recommended. 3. Appendix is dilated measuring up to 14 mm in diameter. This is presumably secondary to reactive inflammation, however, attention at time of forthcoming exploratory laparotomy is recommended. 4. No definitive findings to suggest aortic dissection on today's  noncontrast CT examination. 5. Hepatic steatosis. 6. Additional incidental findings, as above. Critical Value/emergent results were called by telephone at the time of interpretation on 04/02/2020 at 2:38 pm to provider Sharronda Schweers, who verbally acknowledged these results. Electronically Signed   By: Vinnie Langton M.D.   On: 04/05/2020 14:44   CT CHEST WO CONTRAST  Result Date: 03/17/2020 CLINICAL DATA:  64 year old male with history of abdominal pain. Suspected aortic dissection. EXAM: CT CHEST, ABDOMEN AND PELVIS WITHOUT CONTRAST TECHNIQUE: Multidetector CT imaging of the chest, abdomen and pelvis was performed following the standard protocol without IV contrast. COMPARISON:  No priors. FINDINGS: Comment: Today's study is limited for detection and characterization of visceral and/or vascular lesions by lack of IV contrast material. CT CHEST FINDINGS Cardiovascular: Heart size is normal. There is no significant pericardial fluid, thickening or pericardial calcification. Small amount of gas is present in the right ventricle and pulmonic trunk. There is aortic atherosclerosis, as well as atherosclerosis of the great vessels of the mediastinum and the coronary arteries, including calcified atherosclerotic plaque in the left anterior descending coronary artery. No crescentic high attenuation associated with the wall of the thoracic aorta to suggest acute intramural hemorrhage. Small amount of gas also noted in the left axillary vein. Mediastinum/Nodes: No pathologically enlarged mediastinal or hilar lymph nodes. Esophagus is unremarkable in appearance. No axillary lymphadenopathy. Lungs/Pleura: No suspicious appearing pulmonary nodules or masses are noted. No acute consolidative airspace disease. No pleural effusions. Linear areas of scarring and/or subsegmental atelectasis are noted in the dependent portions of the lungs bilaterally. Musculoskeletal: There are no aggressive appearing lytic or blastic lesions noted  in the visualized portions of the skeleton. CT ABDOMEN PELVIS FINDINGS Hepatobiliary: Mild diffuse low attenuation throughout the hepatic parenchyma, indicative of hepatic steatosis. No definite discrete cystic or solid hepatic lesions are confidently identified on today's noncontrast CT examination. Unenhanced appearance of the gallbladder is normal. Pancreas: No definite pancreatic mass or peripancreatic fluid collections or inflammatory changes are noted on today's noncontrast CT examination. Spleen: Unremarkable. Adrenals/Urinary Tract: 1.7 cm low-attenuation lesion in the medial aspect of the lower pole of the left kidney, incompletely characterized on today's non-contrast CT examination, but statistically likely to represent a cyst. Right kidney and bilateral adrenal glands are normal in appearance. No hydroureteronephrosis. Urinary bladder is normal in appearance. Stomach/Bowel: Normal appearance of the stomach. Multiple prominent borderline dilated loops of small bowel in the left side of the abdomen measuring up to 3.1 cm in diameter with some subjective wall thickening in the region of the jejunum and  small air-fluid levels. Moderate distension of the colon with fluid and gas. Potential pneumatosis in the region of the cecum best appreciated on axial image 82 of series 508 and coronal image 65 of series 511. Appendix appears dilated measuring up to 1.4 cm in diameter. Vascular/Lymphatic: Aortic atherosclerosis. No lymphadenopathy noted in the abdomen or pelvis. Reproductive: Prostate gland and seminal vesicles are unremarkable in appearance. Other: Large volume of pneumoperitoneum. In addition, there are some unusual sites of extraluminal gas most notably in the low anatomic pelvis encircling the mesorectum, and in the spaces adjacent to the seminal vesicles, some of which may potentially be within venous structures. Small volume of ascites. Musculoskeletal: There are no aggressive appearing lytic or  blastic lesions noted in the visualized portions of the skeleton. IMPRESSION: 1. Large amount of pneumoperitoneum indicative of bowel perforation. The exact site of perforation is uncertain on today's examination, however, the favored candidate sites for perforation include the cecum where there is potential pneumatosis, and the rectosigmoid region where there appears to be a small amount of gas in the veins associated with the seminal vesicles and a small amount of gas encircling the mesorectum. Surgical consultation is strongly recommended. 2. Gas right ventricle and pulmonic trunk, as well as the left axillary vein. Findings are favored to be iatrogenic related to IV access. However, there may be some venous gas in the low anatomic pelvis, as discussed above. The possibility of ischemic bowel should be considered, and correlation with lactate levels is recommended. 3. Appendix is dilated measuring up to 14 mm in diameter. This is presumably secondary to reactive inflammation, however, attention at time of forthcoming exploratory laparotomy is recommended. 4. No definitive findings to suggest aortic dissection on today's noncontrast CT examination. 5. Hepatic steatosis. 6. Additional incidental findings, as above. Critical Value/emergent results were called by telephone at the time of interpretation on 04/08/2020 at 2:38 pm to provider Isys Tietje, who verbally acknowledged these results. Electronically Signed   By: Vinnie Langton M.D.   On: 04/07/2020 14:44   DG Chest Port 1 View  Result Date: 03/23/2020 CLINICAL DATA:  Questionable sepsis, abdominal pain, evaluate for free air. EXAM: PORTABLE CHEST 1 VIEW COMPARISON:  No pertinent prior exams are available for comparison. FINDINGS: Heart size within normal limits. Aortic atherosclerosis. There is free intraperitoneal air beneath the diaphragm bilaterally. Elevation of the hemidiaphragms bilaterally with minimal bibasilar atelectasis. The lungs are otherwise  clear. No evidence of pleural effusion or pneumothorax. No acute bony abnormality identified. These results were called by telephone at the time of interpretation on 04/03/2020 at 1:15 pm to provider Taequan Stockhausen , who verbally acknowledged these results. IMPRESSION: Intraperitoneal free air beneath both hemidiaphragms concerning for perforated viscus. Elevation of the hemidiaphragms bilaterally with minimal bibasilar atelectasis. The lungs are otherwise clear. Aortic Atherosclerosis (ICD10-I70.0). Electronically Signed   By: Kellie Simmering DO   On: 04/12/2020 13:17    Imaging discussed with Dr. Peyton Najjar as well as radiologist Dr. Nyoka Cowden.  Free air noted at 115pm ____________________________________________   PROCEDURES  Procedure(s) performed: None  Procedures  Critical Care performed: Yes, see critical care note(s)  CRITICAL CARE Performed by: Delman Kitten   Total critical care time: 45 minutes  Critical care time was exclusive of separately billable procedures and treating other patients.  Critical care was necessary to treat or prevent imminent or life-threatening deterioration.  Critical care was time spent personally by me on the following activities: development of treatment plan with patient and/or surrogate as  well as nursing, discussions with consultants, evaluation of patient's response to treatment, examination of patient, obtaining history from patient or surrogate, ordering and performing treatments and interventions, ordering and review of laboratory studies, ordering and review of radiographic studies, pulse oximetry and re-evaluation of patient's condition.  Perforated viscus.  Hypotension.  Life-threatening intra-abdominal catastrophe  ____________________________________________   INITIAL IMPRESSION / ASSESSMENT AND PLAN / ED COURSE  Pertinent labs & imaging results that were available during my care of the patient were reviewed by me and considered in my medical decision  making (see chart for details).   Differential diagnosis includes but is not limited to, abdominal perforation, aortic dissection, cholecystitis, appendicitis, diverticulitis, colitis, esophagitis/gastritis, kidney stone, pyelonephritis, urinary tract infection, aortic aneurysm. All are considered in decision and treatment plan. Based upon the patient's presentation and risk factors, proceed with emergent imaging I have a high concern for acute intra-abdominal pathology   Patient also had mild hypoxia requiring oxygen, and tachypnea.  This seems to be driven primarily by an abdominal process though possibly due to his level of distention.  Await CT imaging to evaluate and rule out etiology such as perforation, obstruction, aneurysm etc.  Patient denies chest pain or breathing difficulty other than he feels like his abdomen is swollen causing him to have difficulty getting a full breath.  Clinical Course as of Mar 14 1449  Wed Mar 14, 2020  1315 Nursing noted that blood pressure right arm different than the left arm.  Blood pressure on the right arm measures in the 70s, while left arm measuring in the 170 range.  Have ordered stat CT angiogram chest abdomen pelvis to exclude dissection   [MQ]  1317 Abdominal free air denoted on chest x-ray.  Stat paged Dr. Milas Gain (gen surg)   [MQ]  1320 Patient noted to be diaphoretic, appears to be worsening in condition.  Imaging concerning for intra-abdominal catastrophe.  Await callback from general surgery who have been stat paged   [MQ]  [place] error, on-call surgeon Dr. Peyton Najjar.  I have discussed the case with him (Dr. Peyton Najjar) and he is coming for stat consult now. Does ask CT be performed as rapid as possible which I have discussed with CT tech.    [MQ]  1343  reassessment, sepsis reassessment completed.  Patient appears fatigued, pain improved.  He is alert to voice.  Blood pressure 77/44 now on his right arm.  IVs infusing bilateral normal saline  wide open and running well.  Have communicated with CT department earliet, coming to get him for emergent imaging now   [MQ]  1344 Dr. Peyton Najjar at bedside.    [MQ]    Clinical Course User Index [MQ] Delman Kitten, MD    Complex CT per radiology. Presumably has a bowel perforation and concern cecum involved or peri-rectal region (full CT read to follow). There is possibly gas in the venous system and concern also of bowel ischemia.   ----------------------------------------- 2:49 PM on 04/05/2020 -----------------------------------------  Patient has moved to the operating room under the care of general surgery at this time.  ____________________________________________   FINAL CLINICAL IMPRESSION(S) / ED DIAGNOSES  Final diagnoses:  Intra-abdominal free air of unknown etiology  Suspected intra-abdominal perforation of viscus organ Possible bowel ischemia Severe sepsis or septic shock suspected      Note:  This document was prepared using Dragon voice recognition software and may include unintentional dictation errors       Delman Kitten, MD 04/11/2020 1453

## 2020-03-14 NOTE — Progress Notes (Signed)
1433 Pt to OR for emergent procedure. Will continue to monitor Code Sepsis from Donald Stephenson.

## 2020-03-14 NOTE — Anesthesia Procedure Notes (Signed)
Arterial Line Insertion Start/End09/23/2021 3:00 PM, 03/14/2020 3:18 PM Performed by: Arita Miss, MD, anesthesiologist  Patient location: OR. Preanesthetic checklist: patient identified, IV checked, site marked, risks and benefits discussed, surgical consent, monitors and equipment checked, pre-op evaluation, timeout performed and anesthesia consent Patient sedated Left, radial was placed Catheter size: 20 Fr Hand hygiene performed  and maximum sterile barriers used   Attempts: 1 Procedure performed using ultrasound guided technique. Ultrasound Notes:anatomy identified, needle tip was noted to be adjacent to the nerve/plexus identified and no ultrasound evidence of intravascular and/or intraneural injection Following insertion, dressing applied. Post procedure assessment: normal and unchanged  Patient tolerated the procedure well with no immediate complications. Additional procedure comments: Performed in urgent fashion under general anesthesia.

## 2020-03-14 NOTE — H&P (Signed)
SURGICAL CONSULTATION NOTE   HISTORY OF PRESENT ILLNESS (HPI):  64 y.o. male presented to [facility] ED for evaluation of abdominal pain. Patient reports not been able to have a bowel movement in the last 6 days.  He started having worsening abdominal pain.  The patient started to have altered mental status so it has been difficult to continue the history.  As per discussion with the ED physician patient has been presenting with low blood pressure and aggressive IV hydration has been started.  Unable to perform complete rest of the history.  On chest x-ray patient with air under the diaphragm.  I personally evaluated the images.  Patient is currently on CT scan  Surgery is consulted by Dr. Jacqualine Code in this context for evaluation and management of bowel perforation.  PAST MEDICAL HISTORY (PMH):  Past Medical History:  Diagnosis Date  . Arthritis   . Asthma    as a child-no problems since childhood  . Diabetes mellitus without complication ([place])   . Hypertension      PAST SURGICAL HISTORY ([place]):  Past Surgical History:  Procedure Laterality Date  . NO PAST SURGERIES    . 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 Authorizing Provider  amLODipine (NORVASC) 10 MG tablet Take 10 mg by mouth every morning.    [provider]  docusate sodium (COLACE) 100 MG capsule Take 1 capsule (100 mg total) by mouth 2 (two) times daily. 04/20/19   Duanne Guess, PA-C  glimepiride (AMARYL) 4 MG tablet Take 4 mg by mouth 2 (two) times daily with a meal.    [provider]  lisinopril (ZESTRIL) 40 MG tablet Take 40 mg by mouth every morning.    [provider]  metFORMIN (GLUCOPHAGE) 1000 MG tablet Take 1,000 mg by mouth 2 (two) times daily with a  meal.    [provider]  METOPROLOL SUCCINATE ER PO Take 25 mg by mouth every morning.    [provider]  Multiple Vitamin (MULTIVITAMIN) tablet Take 1 tablet by mouth daily.    [provider]  traMADol (ULTRAM) 50 MG tablet Take 1 tablet (50 mg total) by mouth every 12 (twelve) hours as needed. 08/02/19   Sable Feil, PA-C     ALLERGIES:  Allergies  Allergen Reactions  . Bee Venom Swelling  . Other Hives    ants     SOCIAL HISTORY:  Social History   Socioeconomic History  . Marital status: Married    Spouse name: Not on file  . Number of children: Not on file  . Years of education: Not on file  . Highest education level: Not on file  Occupational History  . Not on file  Tobacco Use  . Smoking status: Former Smoker    Years: 2.00    Types: Cigarettes    Quit date: 04/10/1969    Years since quitting: 50.9  . Smokeless tobacco: Never Used  Vaping Use  . Vaping Use: Never used  Substance and Sexual Activity  . Alcohol use: Yes    Comment: 2-3 shots and 1 beer daily  . Drug use: Yes    Frequency: 5.0 times per week    Types: Marijuana  . Sexual activity: Not on file  Other Topics  Concern  . Not on file  Social History Narrative  . Not on file   Social Determinants of Health   Financial Resource Strain:   . Difficulty of Paying Living Expenses: Not on file  Food Insecurity:   . Worried About Charity fundraiser in the Last Year: Not on file  . Ran Out of Food in the Last Year: Not on file  Transportation Needs:   . Lack of Transportation (Medical): Not on file  . Lack of Transportation (Non-Medical): Not on file  Physical Activity:   . Days of Exercise per Week: Not on file  . Minutes of Exercise per Session: Not on file  Stress:   . Feeling of Stress : Not on file  Social Connections:   . Frequency of Communication with Friends and Family: Not on file  . Frequency of Social Gatherings with Friends and Family: Not on file  .  Attends Religious Services: Not on file  . Active Member of Clubs or Organizations: Not on file  . Attends Archivist Meetings: Not on file  . Marital Status: Not on file  Intimate Partner Violence:   . Fear of Current or Ex-Partner: Not on file  . Emotionally Abused: Not on file  . Physically Abused: Not on file  . Sexually Abused: Not on file      FAMILY HISTORY:  History reviewed. No pertinent family history.   REVIEW OF SYSTEMS:  Constitutional: denies weight loss, fever, chills, or sweats.  Positive for abdominal status Eyes: denies any other vision changes, history of eye injury  ENT: denies sore throat, hearing problems  Respiratory: denies shortness of breath, wheezing  Cardiovascular: denies chest pain, palpitations  Gastrointestinal: Positive abdominal pain, nausea and vomiting.  Positive constipation Genitourinary: denies burning with urination or urinary frequency Musculoskeletal: denies any other joint pains or cramps  Skin: denies any other rashes or skin discolorations  Neurological: denies any other headache, dizziness, weakness  Psychiatric: denies any other depression, anxiety   All other review of systems were negative   VITAL SIGNS:  Temp:  [98.3 F (36.8 C)] 98.3 F (36.8 C) (09/01 1227) Pulse Rate:  [110] 110 (09/01 1227) Resp:  [38] 38 (09/01 1227) SpO2:  [95 %] 95 % (09/01 1227) Weight:  [86.6 kg] 86.6 kg (09/01 1229)     Height: 5\' 8"  (172.7 cm) Weight: 86.6 kg BMI (Calculated): 29.05   INTAKE/OUTPUT:  This shift: No intake/output data recorded.  Last 2 shifts: @IOLAST2SHIFTS @   PHYSICAL EXAM:  Constitutional:  --Altered mental status Eyes:  -- Pupils equally round and reactive to light  -- No scleral icterus  Ear, nose, and throat:  -- No jugular venous distension  Pulmonary:  -- No crackles  -- Equal breath sounds bilaterally -- Breathing non-labored at rest Cardiovascular:  -- S1, S2 present  -- No pericardial  rubs Gastrointestinal:  -- Abdomen Tender, distended, guarding  -- No abdominal masses appreciated, pulsatile or otherwise  Musculoskeletal and Integumentary:  -- Wounds: None appreciated -- Extremities: B/L UE and LE FROM, hands and feet warm, no edema  Neurologic:  -- Motor function: intact and symmetric -- Sensation: intact and symmetric   Labs:  CBC Latest Ref Rng & Units 04/05/2020 08/02/2019 04/20/2019  WBC 4.0 - 10.5 K/uL 5.8 10.8(H) 6.6  Hemoglobin 13.0 - 17.0 g/dL 14.0 13.9 12.2(L)  Hematocrit 39 - 52 % 42.9 41.0 35.9(L)  Platelets 150 - 400 K/uL 283 159 152   CMP Latest Ref Rng &  Units 03/16/2020 08/02/2019 04/20/2019  Glucose 70 - 99 mg/dL 300(H) 237(H) 176(H)  BUN 8 - 23 mg/dL 37(H) 17 12  Creatinine 0.61 - 1.24 mg/dL 3.27(H) 1.08 0.80  Sodium 135 - 145 mmol/L 139 137 135  Potassium 3.5 - 5.1 mmol/L 3.8 4.5 4.5  Chloride 98 - 111 mmol/L 101 100 104  CO2 22 - 32 mmol/L 21(L) 27 25  Calcium 8.9 - 10.3 mg/dL 8.8(L) 9.3 8.6(L)  Total Protein 6.5 - 8.1 g/dL 6.2(L) - -  Total Bilirubin 0.3 - 1.2 mg/dL 1.1 - -  Alkaline Phos 38 - 126 U/L 57 - -  AST 15 - 41 U/L 27 - -  ALT 0 - 44 U/L 12 - -   Imaging studies:  I personally evaluated the chest x-ray with air under diaphragm  Assessment/Plan:  64 y.o. male with perforation of the intestine, complicated by pertinent comorbidities including diabetes, hypertension, acute kidney injury, sepsis.  Patient with intestinal perforation as per sign of air under the diaphragm on chest x-ray.  Patient currently on CT scan.  Patient with physical findings of abdominal distention.  Patient with altered mental status and low blood pressure consistent with septic shock.  Patient currently on aggressive IV hydration and will be started on pressors.  I activated the OR for emergent exploratory laparotomy.  Arnold Long, MD

## 2020-03-14 NOTE — Sepsis Progress Note (Signed)
Between ED and OR, pt received 3267mL fluid. This is adequate for the weight based fluid dosing in the Code Sepsis. WIll contact the bedside staff to request the repeat lactic acid be drawn.

## 2020-03-14 NOTE — Consult Note (Signed)
CRITICAL CARE PROGRESS NOTE    Name: Donald Stephenson. MRN: [id number] DOB: [birth year]     LOS: 0   SUBJECTIVE FINDINGS & SIGNIFICANT EVENTS    Patient description:  64yo with PMH as below came in for abdominal pain and lethargy, found to be in shock and had CXR with air under the diaphragm. CT abd with bowel perforation , patient taken to OR emergently found to have 700cc mucopurulent material s/p surgery with resection and post operative septic shock. Patient brought to MICU on MV with open abdomen for medical management.   Lines/tubes : Airway 7.5 mm (Active)  Secured at (cm) 21 cm 04/01/2020 0000     Arterial Line 03/24/2020 Left Radial (Active)     Negative Pressure Wound Therapy Abdomen Mid (Active)     Urethral Catheter lisa godwin rn Double-lumen;Straight-tip 16 Fr. (Active)    Microbiology/Sepsis markers: Results for orders placed or performed during the hospital encounter of 03/30/2020  SARS Coronavirus 2 by RT PCR (hospital order, performed in [facility] hospital lab) Nasopharyngeal Nasopharyngeal Swab     Status: None   Collection Time: 04/02/2020 12:26 PM   Specimen: Nasopharyngeal Swab  Result Value Ref Range Status   SARS Coronavirus 2 NEGATIVE NEGATIVE Final    Comment: (NOTE) SARS-CoV-2 target nucleic acids are NOT DETECTED.  The SARS-CoV-2 RNA is generally detectable in upper and lower respiratory specimens during the acute phase of infection. The lowest concentration of SARS-CoV-2 viral copies this assay can detect is 250 copies / mL. A negative result does not preclude SARS-CoV-2 infection and should not be used as the sole basis for treatment or other patient management decisions.  A negative result may occur with improper specimen collection / handling, submission of specimen  other than nasopharyngeal swab, presence of viral mutation(s) within the areas targeted by this assay, and inadequate number of viral copies (<250 copies / mL). A negative result must be combined with clinical observations, patient history, and epidemiological information.  Fact Sheet for Patients:   StrictlyIdeas.no  Fact Sheet for Healthcare Providers: BankingDealers.co.za  This test is not yet approved or  cleared by the [place] FDA and has been authorized for detection and/or diagnosis of SARS-CoV-2 by FDA under an Emergency Use Authorization (EUA).  This EUA will remain in effect (meaning this test can be used) for the duration of the COVID-19 declaration under Section 564(b)(1) of the Act, 21 U.S.C. section 360bbb-3(b)(1), unless the authorization is terminated or revoked sooner.  Performed at [facility], [address], [place], [place] [address]     Anti-infectives:  Anti-infectives (From admission, onward)   Start     Dose/Rate Route Frequency Ordered Stop   03/25/2020 2230  piperacillin-tazobactam (ZOSYN) IVPB 3.375 g        3.375 g 12.5 mL/hr over 240 Minutes Intravenous Every 8 hours 03/23/2020 1428     04/06/2020 1330  piperacillin-tazobactam (ZOSYN) IVPB 3.375 g        3.375 g 100 mL/hr over 30 Minutes Intravenous STAT 03/18/2020 1328 03/15/2020 1450        PAST MEDICAL HISTORY   Past Medical History:  Diagnosis Date  . Arthritis   . Asthma    as a child-no problems since childhood  . Diabetes mellitus without complication ([place])   . Hypertension      SURGICAL HISTORY   Past Surgical History:  Procedure Laterality Date  . NO PAST SURGERIES    . TOTAL HIP ARTHROPLASTY Left 04/19/2019  Procedure: TOTAL HIP ARTHROPLASTY ANTERIOR APPROACH;  Surgeon: Hessie Knows, MD;  Location: [facility] ORS;  Service: Orthopedics;  Laterality: Left;     FAMILY HISTORY   History reviewed. No pertinent family  history.   SOCIAL HISTORY   Social History   Tobacco Use  . Smoking status: Former Smoker    Years: 2.00    Types: Cigarettes    Quit date: 04/10/1969    Years since quitting: 50.9  . Smokeless tobacco: Never Used  Vaping Use  . Vaping Use: Never used  Substance Use Topics  . Alcohol use: Yes    Comment: 2-3 shots and 1 beer daily  . Drug use: Yes    Frequency: 5.0 times per week    Types: Marijuana     MEDICATIONS   Current Medication:  Current Facility-Administered Medications:  .  0.9 %  sodium chloride infusion, , Intravenous, Continuous, Cintron-Diaz, Edgardo, MD .  acetaminophen (TYLENOL) tablet 650 mg, 650 mg, Oral, Q6H PRN **OR** acetaminophen (TYLENOL) suppository 650 mg, 650 mg, Rectal, Q6H PRN, Herbert Pun, MD .  enoxaparin (LOVENOX) injection 40 mg, 40 mg, Subcutaneous, Q24H, Cintron-Diaz, Edgardo, MD .  fentaNYL 2515mcg in NS 267mL (51mcg/ml) infusion-PREMIX, 0-400 mcg/hr, Intravenous, Continuous, Lunetta Marina, MD .  HYDROcodone-acetaminophen (NORCO/VICODIN) 5-325 MG per tablet 1-2 tablet, 1-2 tablet, Oral, Q4H PRN, Herbert Pun, MD .  metoprolol tartrate (LOPRESSOR) tablet 12.5 mg, 12.5 mg, Oral, Daily, Cintron-Diaz, Edgardo, MD .  morphine 4 MG/ML injection 4 mg, 4 mg, Intravenous, Once, Herbert Pun, MD .  morphine 4 MG/ML injection 4 mg, 4 mg, Intravenous, Q4H PRN, Windell Moment, Edgardo, MD .  ondansetron (ZOFRAN-ODT) disintegrating tablet 4 mg, 4 mg, Oral, Q6H PRN **OR** ondansetron (ZOFRAN) injection 4 mg, 4 mg, Intravenous, Q6H PRN, Herbert Pun, MD, 4 mg at 03/27/2020 1538 .  pantoprazole (PROTONIX) injection 40 mg, 40 mg, Intravenous, QHS, Cintron-Diaz, Edgardo, MD .  piperacillin-tazobactam (ZOSYN) IVPB 3.375 g, 3.375 g, Intravenous, Q8H, Cintron-Diaz, Edgardo, MD  Facility-Administered Medications Ordered in Other Encounters:  .  albumin human 5 % solution, , Intravenous, Continuous PRN, Noles, Mark, CRNA, Stopped  at 03/29/2020 1650 .  dexamethasone (DECADRON) injection, , Intravenous, Anesthesia Intra-op, Noles, Mark, CRNA, 10 mg at 04/01/2020 1538 .  fentaNYL (SUBLIMAZE) injection, , Intravenous, Anesthesia Intra-op, Noles, Mark, CRNA, 100 mcg at 04/10/2020 1436 .  HYDROmorphone (DILAUDID) injection, , Intravenous, Anesthesia Intra-op, Noles, Mark, CRNA, 0.5 mg at 04/03/2020 1559 .  lactated ringers infusion, , Intravenous, Continuous PRN, Nelda Marseille, CRNA, New Bag at 03/27/2020 1610 .  lactated ringers infusion, , Intravenous, Continuous PRN, Nelda Marseille, CRNA, New Bag at 04/05/2020 1544 .  norepinephrine (LEVOPHED) 4mg  in 253mL premix infusion, , Intravenous, Continuous PRN, Noles, Mark, CRNA, Last Rate: 93.75 mL/hr at 03/21/2020 1638, 25 mcg/min at 04/08/2020 1638 .  norepinephrine (LEVOPHED) 8 mcg/ml (for boluses) - Optime, , Intravenous, Continuous PRN, Noles, Mark, CRNA, 1 mL at 04/01/2020 1457 .  phenylephrine (NEO-SYNEPHRINE) 100 mcg/mL in sodium chloride 0.9 % 100 mL infusion, , Intravenous, Continuous PRN, Noles, Mark, CRNA, Stopped at 03/24/2020 1516 .  phenylephrine (NEO-SYNEPHRINE) injection, , Intravenous, Anesthesia Intra-op, Noles, Mark, CRNA, 100 mcg at 04/10/2020 1454 .  propofol (DIPRIVAN) 10 mg/mL bolus/IV push, , Intravenous, Anesthesia Intra-op, Noles, Mark, CRNA, 110 mg at 03/31/2020 1439 .  rocuronium (ZEMURON) injection, , Intravenous, Anesthesia Intra-op, Noles, Mark, CRNA, 50 mg at 04/09/2020 1527 .  succinylcholine (ANECTINE) injection, , Intravenous, Anesthesia Intra-op, Noles, Mark, CRNA, 120 mg at 03/30/2020 1441 .  vasopressin (PITRESSIN) 20  UNIT/ML injection, , Intravenous, Anesthesia Intra-op, Noles, Mark, CRNA, 1 Units at 04/02/2020 1654    ALLERGIES   Bee venom and Other    REVIEW OF SYSTEMS     Unable to obtain due to sedation on mechanical ventilation   PHYSICAL EXAMINATION   Vital Signs: Temp:  [98.3 F (36.8 C)] 98.3 F (36.8 C) (09/01 1227) Pulse Rate:  [110] 110 (09/01  1227) Resp:  [38] 38 (09/01 1227) SpO2:  [95 %] 95 % (09/01 1227) Weight:  [86.6 kg] 86.6 kg (09/01 1229)  GENERAL:age approprate  HEAD: Normocephalic, atraumatic.  EYES: Pupils equal, round, reactive to light.  No scleral icterus.  MOUTH: Moist mucosal membrane. NECK: Supple. No thyromegaly. No nodules. No JVD.  PULMONARY: mild rhonchi with MV sounds in background CARDIOVASCULAR: S1 and S2. Regular rate and rhythm. No murmurs, rubs, or gallops.  GASTROINTESTINAL: surgical abdomen with open wound MUSCULOSKELETAL: No swelling, clubbing, or edema.  NEUROLOGIC: GCS3T SKIN:intact,warm,dry   PERTINENT DATA     Infusions: . sodium chloride    . fentaNYL infusion INTRAVENOUS    . piperacillin-tazobactam (ZOSYN)  IV     Scheduled Medications: . enoxaparin (LOVENOX) injection  40 mg Subcutaneous Q24H  . metoprolol tartrate  12.5 mg Oral Daily  .  morphine injection  4 mg Intravenous Once  . pantoprazole (PROTONIX) IV  40 mg Intravenous QHS   PRN Medications: acetaminophen **OR** acetaminophen, HYDROcodone-acetaminophen, morphine injection, ondansetron **OR** ondansetron (ZOFRAN) IV Hemodynamic parameters:   Intake/Output: No intake/output data recorded.  Ventilator  Settings:    LAB RESULTS:  Basic Metabolic Panel: Recent Labs  Lab 03/16/2020 1226 04/12/2020 1347  NA 139  --   K 3.8  --   CL 101  --   CO2 21*  --   GLUCOSE 300*  --   BUN 37*  --   CREATININE 3.27* 3.50*  CALCIUM 8.8*  --    Liver Function Tests: Recent Labs  Lab 03/29/2020 1226  AST 27  ALT 12  ALKPHOS 57  BILITOT 1.1  PROT 6.2*  ALBUMIN 3.0*   Recent Labs  Lab 04/11/2020 1226  LIPASE 35   No results for input(s): AMMONIA in the last 168 hours. CBC: Recent Labs  Lab 04/11/2020 1226  WBC 5.8  NEUTROABS 4.5  HGB 14.0  HCT 42.9  MCV 89.9  PLT 283   Cardiac Enzymes: No results for input(s): CKTOTAL, CKMB, CKMBINDEX, TROPONINI in the last 168 hours. BNP: Invalid input(s):  POCBNP CBG: Recent Labs  Lab 03/28/2020 1235 03/23/2020 1642  GLUCAP 258* 261*       IMAGING RESULTS:  Imaging: CT ABDOMEN PELVIS WO CONTRAST  Result Date: 03/21/2020 CLINICAL DATA:  64 year old male with history of abdominal pain. Suspected aortic dissection. EXAM: CT CHEST, ABDOMEN AND PELVIS WITHOUT CONTRAST TECHNIQUE: Multidetector CT imaging of the chest, abdomen and pelvis was performed following the standard protocol without IV contrast. COMPARISON:  No priors. FINDINGS: Comment: Today's study is limited for detection and characterization of visceral and/or vascular lesions by lack of IV contrast material. CT CHEST FINDINGS Cardiovascular: Heart size is normal. There is no significant pericardial fluid, thickening or pericardial calcification. Small amount of gas is present in the right ventricle and pulmonic trunk. There is aortic atherosclerosis, as well as atherosclerosis of the great vessels of the mediastinum and the coronary arteries, including calcified atherosclerotic plaque in the left anterior descending coronary artery. No crescentic high attenuation associated with the wall of the thoracic aorta to suggest acute intramural hemorrhage.  Small amount of gas also noted in the left axillary vein. Mediastinum/Nodes: No pathologically enlarged mediastinal or hilar lymph nodes. Esophagus is unremarkable in appearance. No axillary lymphadenopathy. Lungs/Pleura: No suspicious appearing pulmonary nodules or masses are noted. No acute consolidative airspace disease. No pleural effusions. Linear areas of scarring and/or subsegmental atelectasis are noted in the dependent portions of the lungs bilaterally. Musculoskeletal: There are no aggressive appearing lytic or blastic lesions noted in the visualized portions of the skeleton. CT ABDOMEN PELVIS FINDINGS Hepatobiliary: Mild diffuse low attenuation throughout the hepatic parenchyma, indicative of hepatic steatosis. No definite discrete cystic or  solid hepatic lesions are confidently identified on today's noncontrast CT examination. Unenhanced appearance of the gallbladder is normal. Pancreas: No definite pancreatic mass or peripancreatic fluid collections or inflammatory changes are noted on today's noncontrast CT examination. Spleen: Unremarkable. Adrenals/Urinary Tract: 1.7 cm low-attenuation lesion in the medial aspect of the lower pole of the left kidney, incompletely characterized on today's non-contrast CT examination, but statistically likely to represent a cyst. Right kidney and bilateral adrenal glands are normal in appearance. No hydroureteronephrosis. Urinary bladder is normal in appearance. Stomach/Bowel: Normal appearance of the stomach. Multiple prominent borderline dilated loops of small bowel in the left side of the abdomen measuring up to 3.1 cm in diameter with some subjective wall thickening in the region of the jejunum and small air-fluid levels. Moderate distension of the colon with fluid and gas. Potential pneumatosis in the region of the cecum best appreciated on axial image 82 of series 508 and coronal image 65 of series 511. Appendix appears dilated measuring up to 1.4 cm in diameter. Vascular/Lymphatic: Aortic atherosclerosis. No lymphadenopathy noted in the abdomen or pelvis. Reproductive: Prostate gland and seminal vesicles are unremarkable in appearance. Other: Large volume of pneumoperitoneum. In addition, there are some unusual sites of extraluminal gas most notably in the low anatomic pelvis encircling the mesorectum, and in the spaces adjacent to the seminal vesicles, some of which may potentially be within venous structures. Small volume of ascites. Musculoskeletal: There are no aggressive appearing lytic or blastic lesions noted in the visualized portions of the skeleton. IMPRESSION: 1. Large amount of pneumoperitoneum indicative of bowel perforation. The exact site of perforation is uncertain on today's examination,  however, the favored candidate sites for perforation include the cecum where there is potential pneumatosis, and the rectosigmoid region where there appears to be a small amount of gas in the veins associated with the seminal vesicles and a small amount of gas encircling the mesorectum. Surgical consultation is strongly recommended. 2. Gas right ventricle and pulmonic trunk, as well as the left axillary vein. Findings are favored to be iatrogenic related to IV access. However, there may be some venous gas in the low anatomic pelvis, as discussed above. The possibility of ischemic bowel should be considered, and correlation with lactate levels is recommended. 3. Appendix is dilated measuring up to 14 mm in diameter. This is presumably secondary to reactive inflammation, however, attention at time of forthcoming exploratory laparotomy is recommended. 4. No definitive findings to suggest aortic dissection on today's noncontrast CT examination. 5. Hepatic steatosis. 6. Additional incidental findings, as above. Critical Value/emergent results were called by telephone at the time of interpretation on 03/16/2020 at 2:38 pm to provider MARK QUALE, who verbally acknowledged these results. Electronically Signed   By: Vinnie Langton M.D.   On: 03/28/2020 14:44   CT CHEST WO CONTRAST  Result Date: 04/02/2020 CLINICAL DATA:  64 year old male with history of abdominal  pain. Suspected aortic dissection. EXAM: CT CHEST, ABDOMEN AND PELVIS WITHOUT CONTRAST TECHNIQUE: Multidetector CT imaging of the chest, abdomen and pelvis was performed following the standard protocol without IV contrast. COMPARISON:  No priors. FINDINGS: Comment: Today's study is limited for detection and characterization of visceral and/or vascular lesions by lack of IV contrast material. CT CHEST FINDINGS Cardiovascular: Heart size is normal. There is no significant pericardial fluid, thickening or pericardial calcification. Small amount of gas is present in  the right ventricle and pulmonic trunk. There is aortic atherosclerosis, as well as atherosclerosis of the great vessels of the mediastinum and the coronary arteries, including calcified atherosclerotic plaque in the left anterior descending coronary artery. No crescentic high attenuation associated with the wall of the thoracic aorta to suggest acute intramural hemorrhage. Small amount of gas also noted in the left axillary vein. Mediastinum/Nodes: No pathologically enlarged mediastinal or hilar lymph nodes. Esophagus is unremarkable in appearance. No axillary lymphadenopathy. Lungs/Pleura: No suspicious appearing pulmonary nodules or masses are noted. No acute consolidative airspace disease. No pleural effusions. Linear areas of scarring and/or subsegmental atelectasis are noted in the dependent portions of the lungs bilaterally. Musculoskeletal: There are no aggressive appearing lytic or blastic lesions noted in the visualized portions of the skeleton. CT ABDOMEN PELVIS FINDINGS Hepatobiliary: Mild diffuse low attenuation throughout the hepatic parenchyma, indicative of hepatic steatosis. No definite discrete cystic or solid hepatic lesions are confidently identified on today's noncontrast CT examination. Unenhanced appearance of the gallbladder is normal. Pancreas: No definite pancreatic mass or peripancreatic fluid collections or inflammatory changes are noted on today's noncontrast CT examination. Spleen: Unremarkable. Adrenals/Urinary Tract: 1.7 cm low-attenuation lesion in the medial aspect of the lower pole of the left kidney, incompletely characterized on today's non-contrast CT examination, but statistically likely to represent a cyst. Right kidney and bilateral adrenal glands are normal in appearance. No hydroureteronephrosis. Urinary bladder is normal in appearance. Stomach/Bowel: Normal appearance of the stomach. Multiple prominent borderline dilated loops of small bowel in the left side of the abdomen  measuring up to 3.1 cm in diameter with some subjective wall thickening in the region of the jejunum and small air-fluid levels. Moderate distension of the colon with fluid and gas. Potential pneumatosis in the region of the cecum best appreciated on axial image 82 of series 508 and coronal image 65 of series 511. Appendix appears dilated measuring up to 1.4 cm in diameter. Vascular/Lymphatic: Aortic atherosclerosis. No lymphadenopathy noted in the abdomen or pelvis. Reproductive: Prostate gland and seminal vesicles are unremarkable in appearance. Other: Large volume of pneumoperitoneum. In addition, there are some unusual sites of extraluminal gas most notably in the low anatomic pelvis encircling the mesorectum, and in the spaces adjacent to the seminal vesicles, some of which may potentially be within venous structures. Small volume of ascites. Musculoskeletal: There are no aggressive appearing lytic or blastic lesions noted in the visualized portions of the skeleton. IMPRESSION: 1. Large amount of pneumoperitoneum indicative of bowel perforation. The exact site of perforation is uncertain on today's examination, however, the favored candidate sites for perforation include the cecum where there is potential pneumatosis, and the rectosigmoid region where there appears to be a small amount of gas in the veins associated with the seminal vesicles and a small amount of gas encircling the mesorectum. Surgical consultation is strongly recommended. 2. Gas right ventricle and pulmonic trunk, as well as the left axillary vein. Findings are favored to be iatrogenic related to IV access. However, there may be some  venous gas in the low anatomic pelvis, as discussed above. The possibility of ischemic bowel should be considered, and correlation with lactate levels is recommended. 3. Appendix is dilated measuring up to 14 mm in diameter. This is presumably secondary to reactive inflammation, however, attention at time of  forthcoming exploratory laparotomy is recommended. 4. No definitive findings to suggest aortic dissection on today's noncontrast CT examination. 5. Hepatic steatosis. 6. Additional incidental findings, as above. Critical Value/emergent results were called by telephone at the time of interpretation on 03/27/2020 at 2:38 pm to provider MARK QUALE, who verbally acknowledged these results. Electronically Signed   By: Vinnie Langton M.D.   On: 03/22/2020 14:44   DG Chest Port 1 View  Result Date: 04/12/2020 CLINICAL DATA:  Questionable sepsis, abdominal pain, evaluate for free air. EXAM: PORTABLE CHEST 1 VIEW COMPARISON:  No pertinent prior exams are available for comparison. FINDINGS: Heart size within normal limits. Aortic atherosclerosis. There is free intraperitoneal air beneath the diaphragm bilaterally. Elevation of the hemidiaphragms bilaterally with minimal bibasilar atelectasis. The lungs are otherwise clear. No evidence of pleural effusion or pneumothorax. No acute bony abnormality identified. These results were called by telephone at the time of interpretation on 03/20/2020 at 1:15 pm to provider MARK QUALE , who verbally acknowledged these results. IMPRESSION: Intraperitoneal free air beneath both hemidiaphragms concerning for perforated viscus. Elevation of the hemidiaphragms bilaterally with minimal bibasilar atelectasis. The lungs are otherwise clear. Aortic Atherosclerosis (ICD10-I70.0). Electronically Signed   By: Kellie Simmering DO   On: 04/10/2020 13:17   @[email or web address] @ CT ABDOMEN PELVIS WO CONTRAST  Result Date: 04/01/2020 CLINICAL DATA:  64 year old male with history of abdominal pain. Suspected aortic dissection. EXAM: CT CHEST, ABDOMEN AND PELVIS WITHOUT CONTRAST TECHNIQUE: Multidetector CT imaging of the chest, abdomen and pelvis was performed following the standard protocol without IV contrast. COMPARISON:  No priors. FINDINGS: Comment: Today's study is limited for detection and characterization of  visceral and/or vascular lesions by lack of IV contrast material. CT CHEST FINDINGS Cardiovascular: Heart size is normal. There is no significant pericardial fluid, thickening or pericardial calcification. Small amount of gas is present in the right ventricle and pulmonic trunk. There is aortic atherosclerosis, as well as atherosclerosis of the great vessels of the mediastinum and the coronary arteries, including calcified atherosclerotic plaque in the left anterior descending coronary artery. No crescentic high attenuation associated with the wall of the thoracic aorta to suggest acute intramural hemorrhage. Small amount of gas also noted in the left axillary vein. Mediastinum/Nodes: No pathologically enlarged mediastinal or hilar lymph nodes. Esophagus is unremarkable in appearance. No axillary lymphadenopathy. Lungs/Pleura: No suspicious appearing pulmonary nodules or masses are noted. No acute consolidative airspace disease. No pleural effusions. Linear areas of scarring and/or subsegmental atelectasis are noted in the dependent portions of the lungs bilaterally. Musculoskeletal: There are no aggressive appearing lytic or blastic lesions noted in the visualized portions of the skeleton. CT ABDOMEN PELVIS FINDINGS Hepatobiliary: Mild diffuse low attenuation throughout the hepatic parenchyma, indicative of hepatic steatosis. No definite discrete cystic or solid hepatic lesions are confidently identified on today's noncontrast CT examination. Unenhanced appearance of the gallbladder is normal. Pancreas: No definite pancreatic mass or peripancreatic fluid collections or inflammatory changes are noted on today's noncontrast CT examination. Spleen: Unremarkable. Adrenals/Urinary Tract: 1.7 cm low-attenuation lesion in the medial aspect of the lower pole of the left kidney, incompletely characterized on today's non-contrast CT examination, but statistically likely to represent a cyst. Right kidney and bilateral adrenal  glands are normal in appearance. No hydroureteronephrosis. Urinary bladder is normal in appearance. Stomach/Bowel: Normal appearance of the stomach. Multiple prominent borderline dilated loops of small bowel in the left side of the abdomen measuring up to 3.1 cm in diameter with some subjective wall thickening in the region of the jejunum and small air-fluid levels. Moderate distension of the colon with fluid and gas. Potential pneumatosis in the region of the cecum best appreciated on axial image 82 of series 508 and coronal image 65 of series 511. Appendix appears dilated measuring up to 1.4 cm in diameter. Vascular/Lymphatic: Aortic atherosclerosis. No lymphadenopathy noted in the abdomen or pelvis. Reproductive: Prostate gland and seminal vesicles are unremarkable in appearance. Other: Large volume of pneumoperitoneum. In addition, there are some unusual sites of extraluminal gas most notably in the low anatomic pelvis encircling the mesorectum, and in the spaces adjacent to the seminal vesicles, some of which may potentially be within venous structures. Small volume of ascites. Musculoskeletal: There are no aggressive appearing lytic or blastic lesions noted in the visualized portions of the skeleton. IMPRESSION: 1. Large amount of pneumoperitoneum indicative of bowel perforation. The exact site of perforation is uncertain on today's examination, however, the favored candidate sites for perforation include the cecum where there is potential pneumatosis, and the rectosigmoid region where there appears to be a small amount of gas in the veins associated with the seminal vesicles and a small amount of gas encircling the mesorectum. Surgical consultation is strongly recommended. 2. Gas right ventricle and pulmonic trunk, as well as the left axillary vein. Findings are favored to be iatrogenic related to IV access. However, there may be some venous gas in the low anatomic pelvis, as discussed above. The possibility  of ischemic bowel should be considered, and correlation with lactate levels is recommended. 3. Appendix is dilated measuring up to 14 mm in diameter. This is presumably secondary to reactive inflammation, however, attention at time of forthcoming exploratory laparotomy is recommended. 4. No definitive findings to suggest aortic dissection on today's noncontrast CT examination. 5. Hepatic steatosis. 6. Additional incidental findings, as above. Critical Value/emergent results were called by telephone at the time of interpretation on 04/06/2020 at 2:38 pm to provider MARK QUALE, who verbally acknowledged these results. Electronically Signed   By: Vinnie Langton M.D.   On: 04/08/2020 14:44   CT CHEST WO CONTRAST  Result Date: 03/29/2020 CLINICAL DATA:  64 year old male with history of abdominal pain. Suspected aortic dissection. EXAM: CT CHEST, ABDOMEN AND PELVIS WITHOUT CONTRAST TECHNIQUE: Multidetector CT imaging of the chest, abdomen and pelvis was performed following the standard protocol without IV contrast. COMPARISON:  No priors. FINDINGS: Comment: Today's study is limited for detection and characterization of visceral and/or vascular lesions by lack of IV contrast material. CT CHEST FINDINGS Cardiovascular: Heart size is normal. There is no significant pericardial fluid, thickening or pericardial calcification. Small amount of gas is present in the right ventricle and pulmonic trunk. There is aortic atherosclerosis, as well as atherosclerosis of the great vessels of the mediastinum and the coronary arteries, including calcified atherosclerotic plaque in the left anterior descending coronary artery. No crescentic high attenuation associated with the wall of the thoracic aorta to suggest acute intramural hemorrhage. Small amount of gas also noted in the left axillary vein. Mediastinum/Nodes: No pathologically enlarged mediastinal or hilar lymph nodes. Esophagus is unremarkable in appearance. No axillary  lymphadenopathy. Lungs/Pleura: No suspicious appearing pulmonary nodules or masses are noted. No acute consolidative airspace disease. No pleural  effusions. Linear areas of scarring and/or subsegmental atelectasis are noted in the dependent portions of the lungs bilaterally. Musculoskeletal: There are no aggressive appearing lytic or blastic lesions noted in the visualized portions of the skeleton. CT ABDOMEN PELVIS FINDINGS Hepatobiliary: Mild diffuse low attenuation throughout the hepatic parenchyma, indicative of hepatic steatosis. No definite discrete cystic or solid hepatic lesions are confidently identified on today's noncontrast CT examination. Unenhanced appearance of the gallbladder is normal. Pancreas: No definite pancreatic mass or peripancreatic fluid collections or inflammatory changes are noted on today's noncontrast CT examination. Spleen: Unremarkable. Adrenals/Urinary Tract: 1.7 cm low-attenuation lesion in the medial aspect of the lower pole of the left kidney, incompletely characterized on today's non-contrast CT examination, but statistically likely to represent a cyst. Right kidney and bilateral adrenal glands are normal in appearance. No hydroureteronephrosis. Urinary bladder is normal in appearance. Stomach/Bowel: Normal appearance of the stomach. Multiple prominent borderline dilated loops of small bowel in the left side of the abdomen measuring up to 3.1 cm in diameter with some subjective wall thickening in the region of the jejunum and small air-fluid levels. Moderate distension of the colon with fluid and gas. Potential pneumatosis in the region of the cecum best appreciated on axial image 82 of series 508 and coronal image 65 of series 511. Appendix appears dilated measuring up to 1.4 cm in diameter. Vascular/Lymphatic: Aortic atherosclerosis. No lymphadenopathy noted in the abdomen or pelvis. Reproductive: Prostate gland and seminal vesicles are unremarkable in appearance. Other: Large  volume of pneumoperitoneum. In addition, there are some unusual sites of extraluminal gas most notably in the low anatomic pelvis encircling the mesorectum, and in the spaces adjacent to the seminal vesicles, some of which may potentially be within venous structures. Small volume of ascites. Musculoskeletal: There are no aggressive appearing lytic or blastic lesions noted in the visualized portions of the skeleton. IMPRESSION: 1. Large amount of pneumoperitoneum indicative of bowel perforation. The exact site of perforation is uncertain on today's examination, however, the favored candidate sites for perforation include the cecum where there is potential pneumatosis, and the rectosigmoid region where there appears to be a small amount of gas in the veins associated with the seminal vesicles and a small amount of gas encircling the mesorectum. Surgical consultation is strongly recommended. 2. Gas right ventricle and pulmonic trunk, as well as the left axillary vein. Findings are favored to be iatrogenic related to IV access. However, there may be some venous gas in the low anatomic pelvis, as discussed above. The possibility of ischemic bowel should be considered, and correlation with lactate levels is recommended. 3. Appendix is dilated measuring up to 14 mm in diameter. This is presumably secondary to reactive inflammation, however, attention at time of forthcoming exploratory laparotomy is recommended. 4. No definitive findings to suggest aortic dissection on today's noncontrast CT examination. 5. Hepatic steatosis. 6. Additional incidental findings, as above. Critical Value/emergent results were called by telephone at the time of interpretation on 03/31/2020 at 2:38 pm to provider MARK QUALE, who verbally acknowledged these results. Electronically Signed   By: Vinnie Langton M.D.   On: 03/28/2020 14:44   DG Chest Port 1 View  Result Date: 04/04/2020 CLINICAL DATA:  Questionable sepsis, abdominal pain, evaluate  for free air. EXAM: PORTABLE CHEST 1 VIEW COMPARISON:  No pertinent prior exams are available for comparison. FINDINGS: Heart size within normal limits. Aortic atherosclerosis. There is free intraperitoneal air beneath the diaphragm bilaterally. Elevation of the hemidiaphragms bilaterally with minimal bibasilar atelectasis. The  lungs are otherwise clear. No evidence of pleural effusion or pneumothorax. No acute bony abnormality identified. These results were called by telephone at the time of interpretation on 04/01/2020 at 1:15 pm to provider MARK QUALE , who verbally acknowledged these results. IMPRESSION: Intraperitoneal free air beneath both hemidiaphragms concerning for perforated viscus. Elevation of the hemidiaphragms bilaterally with minimal bibasilar atelectasis. The lungs are otherwise clear. Aortic Atherosclerosis (ICD10-I70.0). Electronically Signed   By: Kellie Simmering DO   On: 04/10/2020 13:17     ASSESSMENT AND PLAN    -Multidisciplinary rounds held today  Septic shock    - present on admission    - due to intra-abdominal infection - s/p surgery - aspiration of >700cc purulent fluid - sent to lab for micro   -ABG, trend lactate   - CVP trend if IJ-TLC   -empiric abx-zosyn   -vasopressor support   - LR to euvolemia   Acute bowel perforation with pneumoperitoneum -discussed with surgeon - appreciate input - Dr Peyton Najjar  - s/p surgery -POD-0 - follow surgical recs   - open abdomen  -ICU monitoring   Acute Renal Failure stage 3-most likely due to septic nephropathy -follow chem 7 -follow UO -continue Foley Catheter-assess need daily   Moderate persistent asthma  - albuterol neb q6h   -abg .=7.26/43/119/19 on 60%  ID -continue IV abx as prescibed -follow up cultures  GI/Nutrition GI PROPHYLAXIS as indicated DIET-->TF's as tolerated Constipation protocol as indicated  ENDO - ICU hypoglycemic\Hyperglycemia protocol -check FSBS per protocol   ELECTROLYTES -follow  labs as needed -replace as needed -pharmacy consultation   DVT/GI PRX ordered -SCDs  TRANSFUSIONS AS NEEDED MONITOR FSBS ASSESS the need for LABS as needed   Critical care provider statement:    Critical care time (minutes):  109   Critical care time was exclusive of:  Separately billable procedures and treating other patients   Critical care was necessary to treat or prevent imminent or life-threatening deterioration of the following conditions:  perforated bowel, pneumoperitoneum, septic shock, AKI.   Critical care was time spent personally by me on the following activities:  Development of treatment plan with patient or surrogate, discussions with consultants, evaluation of patient's response to treatment, examination of patient, obtaining history from patient or surrogate, ordering and performing treatments and interventions, ordering and review of laboratory studies and re-evaluation of patient's condition.  I assumed direction of critical care for this patient from another provider in my specialty: no    This document was prepared using Dragon voice recognition software and may include unintentional dictation errors.    Ottie Glazier, M.D.  Division of [facility]

## 2020-03-14 NOTE — Anesthesia Procedure Notes (Signed)
Procedure Name: Intubation Date/Time: 03/27/2020 3:45 PM Performed by: Nelda Marseille, CRNA Pre-anesthesia Checklist: Patient identified, Patient being monitored, Timeout performed, Emergency Drugs available and Suction available Patient Re-evaluated:Patient Re-evaluated prior to induction Oxygen Delivery Method: Circle system utilized Preoxygenation: Pre-oxygenation with 100% oxygen Induction Type: IV induction Ventilation: Mask ventilation without difficulty Laryngoscope Size: Mac, 3 and McGraph Grade View: Grade I Tube type: Oral Tube size: 7.5 mm Number of attempts: 1 Airway Equipment and Method: Stylet Placement Confirmation: ETT inserted through vocal cords under direct vision,  positive ETCO2 and breath sounds checked- equal and bilateral Secured at: 21 cm Tube secured with: Tape Dental Injury: Teeth and Oropharynx as per pre-operative assessment

## 2020-03-14 NOTE — Anesthesia Preprocedure Evaluation (Signed)
Anesthesia Evaluation  Patient identified by MRN, date of birth, ID bandGeneral Assessment Comment:Patient aox3, but very lethargic, needs frequent redirection  Reviewed: Allergy & Precautions, NPO status , Patient's Chart, lab work & pertinent test results, Unable to perform ROS - Chart review onlyPreop documentation limited or incomplete due to emergent nature of procedure.  History of Anesthesia Complications Negative for: history of anesthetic complications  Airway Mallampati: III  TM Distance: >3 FB Neck ROM: Full   Comment: Copious secretions visualized on oral exam Dental  (+) Teeth Intact, Dental Advisory Given   Pulmonary neg pulmonary ROS, neg sleep apnea, neg COPD, Patient abstained from smoking.Not current smoker, former smoker,    Pulmonary exam normal breath sounds clear to auscultation       Cardiovascular Exercise Tolerance: Good METShypertension, (-) CAD and (-) Past MI (-) dysrhythmias  Rhythm:Regular Rate:Normal - Systolic murmurs    Neuro/Psych negative neurological ROS  negative psych ROS   GI/Hepatic neg GERD  ,(+)     (-) substance abuse  ,   Endo/Other  diabetes  Renal/GU negative Renal ROS     Musculoskeletal   Abdominal (+)  Abdomen: tender.    Peds  Hematology   Anesthesia Other Findings Past Medical History: No date: Arthritis No date: Asthma     Comment:  as a child-no problems since childhood No date: Diabetes mellitus without complication (HCC) No date: Hypertension  Reproductive/Obstetrics                             Anesthesia Physical Anesthesia Plan  ASA: IV and emergent  Anesthesia Plan: General   Post-op Pain Management:    Induction: Intravenous and Rapid sequence  PONV Risk Score and Plan: 3 and Ondansetron and Dexamethasone  Airway Management Planned: Oral ETT  Additional Equipment: Arterial line  Intra-op Plan:   Post-operative  Plan: Possible Post-op intubation/ventilation  Informed Consent: I have reviewed the patients History and Physical, chart, labs and discussed the procedure including the risks, benefits and alternatives for the proposed anesthesia with the patient or authorized representative who has indicated his/her understanding and acceptance.     Only emergency history available and Dental advisory given  Plan Discussed with: CRNA and Surgeon  Anesthesia Plan Comments: (Discussed risks of anesthesia with patient in a succinct manner given emergent nature of surgery, patient already in OR when I came to see patient. Risks  including PONV, sore throat, lip/dental damage. Rare risks discussed as well, such as cardiorespiratory and neurological sequelae. Discussed possibility of post operative ventilation/intubation. Patient understands.)        Anesthesia Quick Evaluation

## 2020-03-14 NOTE — Op Note (Signed)
Preoperative diagnosis: Intestinal perforation  Postoperative diagnosis: Descending colon obstruction.                                              Perforation of the cecum   Procedure: Exploratory laparotomy                      Subtotal colectomy.                       Negative pressure dressing placement  Anesthesia: GETA  Surgeon: Dr. Windell Moment, MD  Wound Classification: Contamitated  Indications: Patient is a 64 y.o. male had abdominal distention, and air under the diaphragm on chest xray Patient with septic shock despite adequate resuscitation at the ED. Resection was required.  Findings:  1. 700 mL of purulent and feculent peritonitis drained from the peritoneal cavity 2.  Palpable mass at the proximal descending colon with perforation of the cecum 3.  Distal ileum and ascending colon left unattached 4.  Due to patient unstable condition with possible pressors decision was made to leave abdomen temporary close and perforation for second look.  Description of procedure: The patient was placed in the supine position and general endotracheal anesthesia was induced. Preoperative antibiotics were given. A Foley catheter and nasogastric tube were placed. The abdomen was prepped and draped in the usual sterile fashion. A time-out was completed verifying correct patient, procedure, site, positioning, and implant(s) and/or special equipment prior to beginning this procedure. A vertical midline incision was made. This was deepened through the subcutaneous tissues and hemostasis was achieved with electrocautery. The linea alba was identified and incised and the peritoneal cavity entered. The abdomen was explored. Adhesions were lysed sharply under direct vision with Metzenbaum scissors.  The abdominal cavity was inspected for gross abnormalities.  No palpable mass was identified distal to the splenic flexure.  The transverse and ascending colon was severely dilated.  There was a perforation of  the cecum.  The small bowel was also inspected for the evidence of disease and was found to be normal.  Initially, the right colon was mobilized by incising the peritoneal attachment from the cecum to the hepatic flexure. The colon and terminal ileum was mobilized medially, taking care to protect the duodenum and right ureter.  At this point the right colon was initially divided and the mesentery was divided with LigaSure.  Since he was so dilated it was very difficult to have adequate exposure. The greater omentum was then taken from the transverse colon up to the splenic flexure.  The splenic flexure mobilization was a very difficult and time-consuming dissection due to the attachment to the proximal descending colon mass.  The lateral peritoneal attachment of the left colon was then incised from the splenic flexure to the sigmoid colon. The left colon was mobilized medially. Care was taken to protect the left ureter.  Points of division on sigmoid colon were then chosen. The bowel was divided with a linear cutting stapler. The mesentery was scored and divided. Mesenteric vessels were secured and divided with LigaSure. The specimen was removed.  The abdominal cavity was irrigated with 4 L of warm saline.  The cystoscopy was irrigated on the splenic hilum due to losing.  2 drains were placed 1 in the left upper quadrant and the second 1 on the right upper quadrant  extending to the pelvis.  A negative pressure dressing was placed for temporary closure of the abdominal cavity.  The anterior abdominal wall wound measured 40 cm x 10 cm for a 400 cm area. Patient remained in critical condition and sent to ICU for further postoperative management.  Specimen: Ascending colon, transverse and descending colon  Complications: None  Estimated Blood Loss: 500 mL

## 2020-03-15 ENCOUNTER — Encounter: Payer: Self-pay | Admitting: General Surgery

## 2020-03-15 LAB — RENAL FUNCTION PANEL
Albumin: 2.1 g/dL — ABNORMAL LOW (ref 3.5–5.0)
Anion gap: 14 (ref 5–15)
BUN: 36 mg/dL — ABNORMAL HIGH (ref 8–23)
CO2: 23 mmol/L (ref 22–32)
Calcium: 7.1 mg/dL — ABNORMAL LOW (ref 8.9–10.3)
Chloride: 105 mmol/L (ref 98–111)
Creatinine, Ser: 2.84 mg/dL — ABNORMAL HIGH (ref 0.61–1.24)
GFR calc Af Amer: 26 mL/min — ABNORMAL LOW (ref >60)
GFR calc non Af Amer: 22 mL/min — ABNORMAL LOW (ref >60)
Glucose, Bld: 313 mg/dL — ABNORMAL HIGH (ref 70–99)
Phosphorus: 5.9 mg/dL — ABNORMAL HIGH (ref 2.5–4.6)
Potassium: 4.7 mmol/L (ref 3.5–5.1)
Sodium: 142 mmol/L (ref 135–145)

## 2020-03-15 LAB — CBC WITH DIFFERENTIAL/PLATELET
Abs Immature Granulocytes: 0.01 10*3/uL (ref 0.00–0.07)
Basophils Absolute: 0.1 10*3/uL (ref 0.0–0.1)
Basophils Relative: 1 %
Eosinophils Absolute: 0 10*3/uL (ref 0.0–0.5)
Eosinophils Relative: 0 %
HCT: 36.5 % — ABNORMAL LOW (ref 39.0–52.0)
Hemoglobin: 11.8 g/dL — ABNORMAL LOW (ref 13.0–17.0)
Immature Granulocytes: 0 %
Lymphocytes Relative: 10 %
Lymphs Abs: 0.8 10*3/uL (ref 0.7–4.0)
MCH: 29.6 pg (ref 26.0–34.0)
MCHC: 32.3 g/dL (ref 30.0–36.0)
MCV: 91.7 fL (ref 80.0–100.0)
Monocytes Absolute: 0.7 10*3/uL (ref 0.1–1.0)
Monocytes Relative: 8 %
Neutro Abs: 6.6 10*3/uL (ref 1.7–7.7)
Neutrophils Relative %: 81 %
Platelets: 200 10*3/uL (ref 150–400)
RBC: 3.98 MIL/uL — ABNORMAL LOW (ref 4.22–5.81)
RDW: 13.2 % (ref 11.5–15.5)
Smear Review: NORMAL
WBC: 8.2 10*3/uL (ref 4.0–10.5)
nRBC: 0 % (ref 0.0–0.2)

## 2020-03-15 LAB — BLOOD GAS, ARTERIAL
Acid-base deficit: 9.5 mmol/L — ABNORMAL HIGH (ref 0.0–2.0)
Bicarbonate: 17.1 mmol/L — ABNORMAL LOW (ref 20.0–28.0)
FIO2: 40
MECHVT: 500 mL
O2 Saturation: 95.5 %
PEEP: 5 cmH2O
Patient temperature: 37
RATE: 20 resp/min
pCO2 arterial: 39 mmHg (ref 32.0–48.0)
pH, Arterial: 7.25 — ABNORMAL LOW (ref 7.350–7.450)
pO2, Arterial: 91 mmHg (ref 83.0–108.0)

## 2020-03-15 LAB — GLUCOSE, CAPILLARY
Glucose-Capillary: 136 mg/dL — ABNORMAL HIGH (ref 70–99)
Glucose-Capillary: 141 mg/dL — ABNORMAL HIGH (ref 70–99)
Glucose-Capillary: 157 mg/dL — ABNORMAL HIGH (ref 70–99)
Glucose-Capillary: 194 mg/dL — ABNORMAL HIGH (ref 70–99)
Glucose-Capillary: 238 mg/dL — ABNORMAL HIGH (ref 70–99)
Glucose-Capillary: 267 mg/dL — ABNORMAL HIGH (ref 70–99)
Glucose-Capillary: 277 mg/dL — ABNORMAL HIGH (ref 70–99)
Glucose-Capillary: 279 mg/dL — ABNORMAL HIGH (ref 70–99)

## 2020-03-15 LAB — FIBRINOGEN: Fibrinogen: >750 mg/dL — ABNORMAL HIGH (ref 210–475)

## 2020-03-15 LAB — HEMOGLOBIN A1C
Hgb A1c MFr Bld: 7.8 % — ABNORMAL HIGH (ref 4.8–5.6)
Mean Plasma Glucose: 177.16 mg/dL

## 2020-03-15 LAB — MAGNESIUM: Magnesium: 2 mg/dL (ref 1.7–2.4)

## 2020-03-15 LAB — HIV ANTIBODY (ROUTINE TESTING W REFLEX): HIV Screen 4th Generation wRfx: NONREACTIVE

## 2020-03-15 LAB — LACTIC ACID, PLASMA
Lactic Acid, Venous: 4 mmol/L (ref 0.5–1.9)
Lactic Acid, Venous: 2.7 mmol/L (ref 0.5–1.9)

## 2020-03-15 MED ORDER — LACTATED RINGERS IV BOLUS
1000.0000 mL | Freq: Once | INTRAVENOUS | Status: AC
  Administered 2020-03-15: 1000 mL via INTRAVENOUS

## 2020-03-15 MED ORDER — PIPERACILLIN-TAZOBACTAM 3.375 G IVPB
3.3750 g | Freq: Three times a day (TID) | INTRAVENOUS | Status: AC
  Administered 2020-03-15 – 2020-03-16 (×4): 3.375 g via INTRAVENOUS
  Filled 2020-03-15 (×4): qty 50

## 2020-03-15 MED ORDER — CHLORHEXIDINE GLUCONATE CLOTH 2 % EX PADS
6.0000 | MEDICATED_PAD | Freq: Every day | CUTANEOUS | Status: DC
  Administered 2020-03-15 – 2020-03-25 (×8): 6 via TOPICAL

## 2020-03-15 MED ORDER — ORAL CARE MOUTH RINSE
15.0000 mL | OROMUCOSAL | Status: DC
  Administered 2020-03-15 – 2020-03-18 (×28): 15 mL via OROMUCOSAL

## 2020-03-15 MED ORDER — NOREPINEPHRINE 16 MG/250ML-% IV SOLN
0.0000 ug/min | INTRAVENOUS | Status: DC
  Administered 2020-03-15: 32 ug/min via INTRAVENOUS
  Administered 2020-03-16: 34 ug/min via INTRAVENOUS
  Administered 2020-03-17: 12.053 ug/min via INTRAVENOUS
  Filled 2020-03-15 (×5): qty 250

## 2020-03-15 MED ORDER — ALBUMIN HUMAN 25 % IV SOLN
12.5000 g | Freq: Every day | INTRAVENOUS | Status: DC
  Administered 2020-03-15 – 2020-03-21 (×7): 12.5 g via INTRAVENOUS
  Filled 2020-03-15 (×7): qty 50

## 2020-03-15 MED ORDER — INSULIN ASPART 100 UNIT/ML ~~LOC~~ SOLN
0.0000 [IU] | SUBCUTANEOUS | Status: DC
  Administered 2020-03-15: 2 [IU] via SUBCUTANEOUS
  Administered 2020-03-15: 1 [IU] via SUBCUTANEOUS
  Administered 2020-03-15: 2 [IU] via SUBCUTANEOUS
  Administered 2020-03-15: 3 [IU] via SUBCUTANEOUS
  Administered 2020-03-16 – 2020-03-17 (×4): 1 [IU] via SUBCUTANEOUS
  Administered 2020-03-18 (×2): 2 [IU] via SUBCUTANEOUS
  Administered 2020-03-18: 1 [IU] via SUBCUTANEOUS
  Administered 2020-03-18: 2 [IU] via SUBCUTANEOUS
  Administered 2020-03-18: 1 [IU] via SUBCUTANEOUS
  Administered 2020-03-19 (×4): 2 [IU] via SUBCUTANEOUS
  Administered 2020-03-19: 3 [IU] via SUBCUTANEOUS
  Administered 2020-03-19: 1 [IU] via SUBCUTANEOUS
  Administered 2020-03-19 – 2020-03-20 (×2): 2 [IU] via SUBCUTANEOUS
  Administered 2020-03-20: 3 [IU] via SUBCUTANEOUS
  Filled 2020-03-15 (×21): qty 1

## 2020-03-15 MED ORDER — SODIUM BICARBONATE 8.4 % IV SOLN
100.0000 meq | Freq: Once | INTRAVENOUS | Status: AC
  Administered 2020-03-15: 100 meq via INTRAVENOUS
  Filled 2020-03-15: qty 100

## 2020-03-15 MED ORDER — NOREPINEPHRINE BITARTRATE 1 MG/ML IV SOLN
0.0000 ug/min | INTRAVENOUS | Status: DC
  Administered 2020-03-15: 40 ug/min via INTRAVENOUS
  Filled 2020-03-15 (×2): qty 16

## 2020-03-15 MED ORDER — CHLORHEXIDINE GLUCONATE 0.12% ORAL RINSE (MEDLINE KIT)
15.0000 mL | Freq: Two times a day (BID) | OROMUCOSAL | Status: DC
  Administered 2020-03-15 – 2020-03-18 (×7): 15 mL via OROMUCOSAL

## 2020-03-15 MED ORDER — INSULIN DETEMIR 100 UNIT/ML ~~LOC~~ SOLN
10.0000 [IU] | Freq: Two times a day (BID) | SUBCUTANEOUS | Status: DC
  Administered 2020-03-15 – 2020-03-16 (×2): 10 [IU] via SUBCUTANEOUS
  Filled 2020-03-15 (×4): qty 0.1

## 2020-03-15 MED ORDER — HYDROCORTISONE NA SUCCINATE PF 100 MG IJ SOLR
100.0000 mg | Freq: Two times a day (BID) | INTRAMUSCULAR | Status: DC
  Administered 2020-03-15 – 2020-03-20 (×10): 100 mg via INTRAVENOUS
  Filled 2020-03-15 (×11): qty 2

## 2020-03-15 NOTE — Anesthesia Postprocedure Evaluation (Signed)
Anesthesia Post Note  Patient: Donald Stephenson.  Procedure(s) Performed: EXPLORATORY LAPAROTOMY SUBTOTAL COLECTOMY NEGATIVE PRESSURE DRESSING PLACEMENT  (N/A Abdomen)  Patient location during evaluation: ICU Anesthesia Type: General Level of consciousness: patient remains intubated per anesthesia plan Pain management: pain level controlled Vital Signs Assessment: post-procedure vital signs reviewed and stable Respiratory status: patient remains intubated per anesthesia plan Cardiovascular status: blood pressure returned to baseline and stable Comments: VSS on Levophed, Phenylephrine, Vasopressin, Fentanyl drips.  Remains intubated, ventilated, eyes are closed. Urinary output yellow, 200-300 cc, appears comfortable.    No complications documented.   Last Vitals:  Vitals:   03/15/20 0500 03/15/20 0600  BP: 120/84 109/80  Pulse: 100 99  Resp: 20 20  Temp: (!) 38.2 C (!) 38.5 C  SpO2: 99% 100%    Last Pain:  Vitals:   03/15/20 0400  TempSrc:   PainSc: 0-No pain                 Buckner Malta

## 2020-03-15 NOTE — Consult Note (Signed)
Pharmacy Antibiotic Note  Donald Stephenson. is a 64 y.o. male admitted on 03/27/2020 with intestinal perforation / intra-abdominal infection,1 Day Post-Op s/p subtotal colectomy.  Pharmacy was consulted for Zosyn dosing. Since admission his renal function has improved somewhat, although not back to his baseline level.  Plan:  Adjust Zosyn dose to 3.375 g q8h extended infusion  Height: 5\' 8"  (172.7 cm) Weight: 86.6 kg (191 lb) IBW/kg (Calculated) : 68.4  Temp (24hrs), Avg:99.6 F (37.6 C), Min:97.5 F (36.4 C), Max:101.3 F (38.5 C)  Recent Labs  Lab 03/22/2020 1226 04/11/2020 1347 03/29/2020 1821 04/06/2020 2104 03/15/20 0044 03/15/20 0419  WBC 5.8  --   --   --   --  8.2  CREATININE 3.27* 3.50*  --   --   --  2.84*  LATICACIDVEN 6.9*  --  4.4* 6.2* 4.0*  --     Estimated Creatinine Clearance: 27.4 mL/min (A) (by C-G formula based on SCr of 2.84 mg/dL (H)).    Antimicrobials this admission: Zosyn 9/1 >>   Microbiology results: 9/1 BCx: NG < 24h 9/1 OR cultures: abundant GNR, few GPC, GPR 9/1 MRSA PCR: negative 9/1 SARS CoV-2: negative  Thank you for allowing pharmacy to be a part of this patient's care.  Dallie Piles 03/15/2020 8:45 AM

## 2020-03-15 NOTE — Progress Notes (Addendum)
   03/15/20 1502  Clinical Encounter Type  Visited With Patient and family together  Visit Type Initial  Referral From Chaplain  Consult/Referral To Chaplain  Chaplain notice a lady sitting in pt's room and went in and introduced herself. She met pt's wife, [name] at the bedside. Wife said "I just want him to get better." Wife said they have been married 30 yrs. Chaplain and wife talked for a while and chaplain asked if she could pray and wife said yes. Chaplain prayed and left.

## 2020-03-15 NOTE — Progress Notes (Signed)
[facility] Day(s): 1.   Post op day(s): 1 Day Post-Op.   Interval History: Patient seen and examined.  Patient continue critically ill.  Continues on 3 pressors.  There is stable hemoglobin.  Dropped from 14,000-11,000 suspected after aggressive hydration.  Patient definitely was dehydrated before surgery.  Vital signs in last 24 hours: [min-max] current  Temp:  [97.5 F (36.4 C)-101.3 F (38.5 C)] 101.3 F (38.5 C) (09/02 0600) Pulse Rate:  [95-110] 99 (09/02 0600) Resp:  [19-38] 20 (09/02 0600) BP: (75-124)/(57-91) 109/80 (09/02 0600) SpO2:  [94 %-100 %] 100 % (09/02 0600) Arterial Line BP: (62-130)/(24-69) 121/62 (09/02 0600) FiO2 (%):  [35 %-60 %] 35 % (09/02 0746) Weight:  [86.6 kg] 86.6 kg (09/01 1229)     Height: 5\' 8"  (172.7 cm) Weight: 86.6 kg BMI (Calculated): 29.05   Physical Exam:  Constitutional: Critically ill, sedated on mechanical ventilation Respiratory: On mechanical ventilation Cardiovascular: Tachycardic Gastrointestinal: Negative pressure dressing in place  Labs:  CBC Latest Ref Rng & Units 03/15/2020 03/17/2020 08/02/2019  WBC 4.0 - 10.5 K/uL 8.2 5.8 10.8(H)  Hemoglobin 13.0 - 17.0 g/dL 11.8(L) 14.0 13.9  Hematocrit 39 - 52 % 36.5(L) 42.9 41.0  Platelets 150 - 400 K/uL 200 283 159   CMP Latest Ref Rng & Units 03/15/2020 03/20/2020 03/17/2020  Glucose 70 - 99 mg/dL 313(H) - 300(H)  BUN 8 - 23 mg/dL 36(H) - 37(H)  Creatinine 0.61 - 1.24 mg/dL 2.84(H) 3.50(H) 3.27(H)  Sodium 135 - 145 mmol/L 142 - 139  Potassium 3.5 - 5.1 mmol/L 4.7 - 3.8  Chloride 98 - 111 mmol/L 105 - 101  CO2 22 - 32 mmol/L 23 - 21(L)  Calcium 8.9 - 10.3 mg/dL 7.1(L) - 8.8(L)  Total Protein 6.5 - 8.1 g/dL - - 6.2(L)  Total Bilirubin 0.3 - 1.2 mg/dL - - 1.1  Alkaline Phos 38 - 126 U/L - - 57  AST 15 - 41 U/L - - 27  ALT 0 - 44 U/L - - 12    Imaging studies: No new pertinent imaging studies   Assessment/Plan:  64 y.o. male with descending colon obstructive mass  with cecum perforation 1 Day Post-Op s/p subtotal colectomy, complicated by pertinent comorbidities including septic shock, acute kidney injury, diabetes, hypertension.  Patient continue critically ill on mechanical ventilation.  Patient stated.  Tolerating current parameters.  Patient on 3 vasopressors.  This morning patient with adequate MAP.  Still with decreased urine output but improving creatinine.  No concern of bleeding at this moment.  Appreciate intensivist for the management of critical care and septic shock.  Patient will continue with NGT to low intermittent suction.  We will continue with Foley in place.  No contraindication for DVT prophylaxis.  We will give patient another 24 hours for stabilization to take him back to the operating room for a second look and possible anastomosis versus ostomy creation.  Arnold Long, MD

## 2020-03-15 NOTE — Consult Note (Signed)
CRITICAL CARE PROGRESS NOTE    Name: Susan Arana. MRN: [id number] DOB: [birth year]     LOS: 1   SUBJECTIVE FINDINGS & SIGNIFICANT EVENTS    Patient description:  64yo with PMH as below came in for abdominal pain and lethargy, found to be in shock and had CXR with air under the diaphragm. CT abd with bowel perforation , patient taken to OR emergently found to have 700cc mucopurulent material s/p surgery with resection and post operative septic shock. Patient brought to MICU on MV with open abdomen for medical management.   03/15/20- patient remains critically ill on MV, on elevated settings with vasopressor support. Spoke to wife and daughter at bedside today. Severe hyperglycemia will add on levemir maintanance. Renal function improved slightly. Reviwed short term care plan with Dr Peyton Najjar.   Lines/tubes : Airway 7.5 mm (Active)  Secured at (cm) 21 cm 03/18/2020 0000     Arterial Line 04/09/2020 Left Radial (Active)     Negative Pressure Wound Therapy Abdomen Mid (Active)     Urethral Catheter lisa godwin rn Double-lumen;Straight-tip 16 Fr. (Active)    Microbiology/Sepsis markers: Results for orders placed or performed during the hospital encounter of 03/28/2020  SARS Coronavirus 2 by RT PCR (hospital order, performed in [facility] hospital lab) Nasopharyngeal Nasopharyngeal Swab     Status: None   Collection Time: 04/03/2020 12:26 PM   Specimen: Nasopharyngeal Swab  Result Value Ref Range Status   SARS Coronavirus 2 NEGATIVE NEGATIVE Final    Comment: (NOTE) SARS-CoV-2 target nucleic acids are NOT DETECTED.  The SARS-CoV-2 RNA is generally detectable in upper and lower respiratory specimens during the acute phase of infection. The lowest concentration of SARS-CoV-2 viral copies this assay can detect is  250 copies / mL. A negative result does not preclude SARS-CoV-2 infection and should not be used as the sole basis for treatment or other patient management decisions.  A negative result may occur with improper specimen collection / handling, submission of specimen other than nasopharyngeal swab, presence of viral mutation(s) within the areas targeted by this assay, and inadequate number of viral copies (<250 copies / mL). A negative result must be combined with clinical observations, patient history, and epidemiological information.  Fact Sheet for Patients:   StrictlyIdeas.no  Fact Sheet for Healthcare Providers: BankingDealers.co.za  This test is not yet approved or  cleared by the [place] FDA and has been authorized for detection and/or diagnosis of SARS-CoV-2 by FDA under an Emergency Use Authorization (EUA).  This EUA will remain in effect (meaning this test can be used) for the duration of the COVID-19 declaration under Section 564(b)(1) of the Act, 21 U.S.C. section 360bbb-3(b)(1), unless the authorization is terminated or revoked sooner.  Performed at [facility], [address] [place], [place] [address]   Blood culture (routine single)     Status: None (Preliminary result)   Collection Time: 04/06/2020  1:39 PM   Specimen: BLOOD  Result Value Ref Range Status   Specimen Description BLOOD BLOOD RIGHT FOREARM  Final   Special Requests   Final    BOTTLES DRAWN AEROBIC AND ANAEROBIC Blood Culture results may not be optimal due to an excessive volume of blood received in culture bottles   Culture   Final    NO GROWTH < 24 HOURS Performed at [facility], [address] [place], [place] [address]    Report Status PENDING  Incomplete  Aerobic/Anaerobic Culture (surgical/deep wound)  Status: None (Preliminary result)   Collection Time: 03/28/2020  3:18 PM   Specimen: PATH Other; Body Fluid  Result  Value Ref Range Status   Specimen Description   Final    PERITONEAL Performed at [facility], [address], [place], [place] [address]    Special Requests   Final    PERIOTTONEAL CAVITY FLUID Performed at [facility], [place], [place] [address]    Gram Stain   Final    MODERATE WBC PRESENT, PREDOMINANTLY PMN ABUNDANT GRAM NEGATIVE RODS FEW GRAM POSITIVE COCCI RARE GRAM POSITIVE RODS Performed at [facility], [place] [address] [place], [place] [address]    Culture PENDING  Incomplete   Report Status PENDING  Incomplete  MRSA PCR Screening     Status: None   Collection Time: 03/24/2020  6:21 PM   Specimen: Nasopharyngeal  Result Value Ref Range Status   MRSA by PCR NEGATIVE NEGATIVE Final    Comment:        The GeneXpert MRSA Assay (FDA approved for NASAL specimens only), is one component of a comprehensive MRSA colonization surveillance program. It is not intended to diagnose MRSA infection nor to guide or monitor treatment for MRSA infections. Performed at [facility], [address] [place], [place] [address]     Anti-infectives:  Anti-infectives (From admission, onward)   Start     Dose/Rate Route Frequency Ordered Stop   03/15/20 1400  piperacillin-tazobactam (ZOSYN) IVPB 3.375 g        3.375 g 12.5 mL/hr over 240 Minutes Intravenous Every 8 hours 03/15/20 0845     03/24/2020 2230  piperacillin-tazobactam (ZOSYN) IVPB 3.375 g  Status:  Discontinued        3.375 g 12.5 mL/hr over 240 Minutes Intravenous Every 8 hours 04/07/2020 1428 04/11/2020 1822   04/02/2020 2200  piperacillin-tazobactam (ZOSYN) IVPB 3.375 g  Status:  Discontinued        3.375 g 12.5 mL/hr over 240 Minutes Intravenous Every 12 hours 03/27/2020 1822 03/15/20 0845   03/29/2020 1330  piperacillin-tazobactam (ZOSYN) IVPB 3.375 g        3.375 g 100 mL/hr over 30 Minutes Intravenous STAT 03/27/2020 1328 03/19/2020 1450        PAST MEDICAL HISTORY   Past  Medical History:  Diagnosis Date  . Arthritis   . Asthma    as a child-no problems since childhood  . Diabetes mellitus without complication ([place])   . Hypertension      SURGICAL HISTORY   Past Surgical History:  Procedure Laterality Date  . LAPAROTOMY N/A 03/15/2020   Procedure: EXPLORATORY LAPAROTOMY SUBTOTAL COLECTOMY NEGATIVE PRESSURE DRESSING PLACEMENT ;  Surgeon: Herbert Pun, MD;  Location: [facility] ORS;  Service: General;  Laterality: N/A;  . NO PAST SURGERIES    . TOTAL HIP ARTHROPLASTY Left 04/19/2019   Procedure: TOTAL HIP ARTHROPLASTY ANTERIOR APPROACH;  Surgeon: Hessie Knows, MD;  Location: [facility] ORS;  Service: Orthopedics;  Laterality: Left;     FAMILY HISTORY   History reviewed. No pertinent family history.   SOCIAL HISTORY   Social History   Tobacco Use  . Smoking status: Former Smoker    Years: 2.00    Types: Cigarettes    Quit date: 04/10/1969    Years since quitting: 50.9  . Smokeless tobacco: Never Used  Vaping Use  . Vaping Use: Never used  Substance Use Topics  . Alcohol use: Yes    Comment: 2-3 shots and 1  beer daily  . Drug use: Yes    Frequency: 5.0 times per week    Types: Marijuana     MEDICATIONS   Current Medication:  Current Facility-Administered Medications:  .  0.9 %  sodium chloride infusion, , Intravenous, Continuous, Cintron-Diaz, Edgardo, MD, Last Rate: 150 mL/hr at 03/15/20 1352, New Bag at 03/15/20 1352 .  acetaminophen (TYLENOL) tablet 650 mg, 650 mg, Oral, Q6H PRN **OR** acetaminophen (TYLENOL) suppository 650 mg, 650 mg, Rectal, Q6H PRN, Herbert Pun, MD, 650 mg at 03/15/20 9509 .  chlorhexidine gluconate (MEDLINE KIT) (PERIDEX) 0.12 % solution 15 mL, 15 mL, Mouth Rinse, BID, Lanney Gins, Shylin Keizer, MD, 15 mL at 03/15/20 0841 .  Chlorhexidine Gluconate Cloth 2 % PADS 6 each, 6 each, Topical, Daily, Awilda Bill, NP, 6 each at 03/15/20 1130 .  fentaNYL 2541mg in NS 2541m(1069mml) infusion-PREMIX, 0-400 mcg/hr,  Intravenous, Continuous, Michille Mcelrath, MD, Last Rate: 27.5 mL/hr at 03/15/20 1442, 275 mcg/hr at 03/15/20 1442 .  heparin injection 5,000 Units, 5,000 Units, Subcutaneous, Q8H, CinHerbert PunD, 5,000 Units at 03/15/20 1355 .  HYDROcodone-acetaminophen (NORCO/VICODIN) 5-325 MG per tablet 1-2 tablet, 1-2 tablet, Oral, Q4H PRN, Cintron-Diaz, Edgardo, MD .  insulin aspart (novoLOG) injection 0-9 Units, 0-9 Units, Subcutaneous, Q4H, GruDallie PilesPH, 2 Units at 03/15/20 1701 .  MEDLINE mouth rinse, 15 mL, Mouth Rinse, 10 times per day, AleOttie GlazierD, 15 mL at 03/15/20 1701 .  metoprolol tartrate (LOPRESSOR) tablet 12.5 mg, 12.5 mg, Oral, Daily, Cintron-Diaz, Edgardo, MD .  midazolam (VERSED) injection 2 mg, 2 mg, Intravenous, Q2H PRN, BlaAwilda BillP, 2 mg at 04/10/2020 1927 .  morphine 4 MG/ML injection 4 mg, 4 mg, Intravenous, Once, Cintron-Diaz, Edgardo, MD .  norepinephrine (LEVOPHED) 16 mg in 250m70memix infusion, 0-40 mcg/min, Intravenous, Titrated, Cintron-Diaz, Edgardo, MD, Last Rate: 28.1 mL/hr at 03/15/20 1443, 30 mcg/min at 03/15/20 1443 .  ondansetron (ZOFRAN-ODT) disintegrating tablet 4 mg, 4 mg, Oral, Q6H PRN **OR** ondansetron (ZOFRAN) injection 4 mg, 4 mg, Intravenous, Q6H PRN, CintHerbert Pun, 4 mg at 03/21/2020 1538 .  pantoprazole (PROTONIX) injection 40 mg, 40 mg, Intravenous, QHS, CintHerbert Pun, 40 mg at 04/08/2020 2125 .  phenylephrine CONCENTRATED 100mg57msodium chloride 0.9% 250mL 58mmg/mL51mnfusion, 0-400 mcg/min, Intravenous, Titrated, Blakeney, Dana G, NP, Last Rate: 28.5 mL/hr at 03/15/20 1350, 190 mcg/min at 03/15/20 1350 .  piperacillin-tazobactam (ZOSYN) IVPB 3.375 g, 3.375 g, Intravenous, Q8H, Grubb, Dallie PilesLast Rate: 12.5 mL/hr at 03/15/20 1357, 3.375 g at 03/15/20 1357 .  vasopressin (PITRESSIN) 20 Units in sodium chloride 0.9 % 100 mL infusion-*FOR SHOCK*, 0-0.03 Units/min, Intravenous, Continuous, Irene Collings, MD,  Last Rate: 9 mL/hr at 03/15/20 0519, 0.03 Units/min at 03/15/20 0519    ALLERGIES   Bee venom and Other    REVIEW OF SYSTEMS     Unable to obtain due to sedation on mechanical ventilation   PHYSICAL EXAMINATION   Vital Signs: Temp:  [97.5 F (36.4 C)-101.8 F (38.8 C)] 101.5 F (38.6 C) (09/02 1700) Pulse Rate:  [94-104] 94 (09/02 1700) Resp:  [19-20] 20 (09/02 1700) BP: (75-131)/(57-91) 117/82 (09/02 1700) SpO2:  [94 %-100 %] 100 % (09/02 1700) Arterial Line BP: (62-155)/(24-78) 91/74 (09/02 1700) FiO2 (%):  [28 %-60 %] 28 % (09/02 1425)  GENERAL:age approprate  HEAD: Normocephalic, atraumatic.  EYES: Pupils equal, round, reactive to light.  No scleral icterus.  MOUTH: Moist mucosal membrane. NECK: Supple. No thyromegaly. No nodules. No JVD.  PULMONARY: mild rhonchi with MV sounds in background CARDIOVASCULAR: S1 and S2. Regular rate and rhythm. No murmurs, rubs, or gallops.  GASTROINTESTINAL: surgical abdomen with open wound MUSCULOSKELETAL: No swelling, clubbing, or edema.  NEUROLOGIC: GCS3T SKIN:intact,warm,dry   PERTINENT DATA     Infusions: . sodium chloride 150 mL/hr at 03/15/20 1352  . fentaNYL infusion INTRAVENOUS 275 mcg/hr (03/15/20 1442)  . norepinephrine (LEVOPHED) Adult infusion 30 mcg/min (03/15/20 1443)  . phenylephrine (NEO-SYNEPHRINE) Adult infusion 190 mcg/min (03/15/20 1350)  . piperacillin-tazobactam (ZOSYN)  IV 3.375 g (03/15/20 1357)  . vasopressin 0.03 Units/min (03/15/20 0519)   Scheduled Medications: . chlorhexidine gluconate (MEDLINE KIT)  15 mL Mouth Rinse BID  . Chlorhexidine Gluconate Cloth  6 each Topical Daily  . heparin injection (subcutaneous)  5,000 Units Subcutaneous Q8H  . insulin aspart  0-9 Units Subcutaneous Q4H  . mouth rinse  15 mL Mouth Rinse 10 times per day  . metoprolol tartrate  12.5 mg Oral Daily  .  morphine injection  4 mg Intravenous Once  . pantoprazole (PROTONIX) IV  40 mg Intravenous QHS   PRN  Medications: acetaminophen **OR** acetaminophen, HYDROcodone-acetaminophen, midazolam, ondansetron **OR** ondansetron (ZOFRAN) IV Hemodynamic parameters:   Intake/Output: 09/01 0701 - 09/02 0700 In: 6950.2 [I.V.:3847.2; IV [id number]:1478] Out: 1575 [Urine:380; Drains:695; Blood:100]  Ventilator  Settings: Vent Mode: PRVC FiO2 (%):  [28 %-60 %] 28 % Set Rate:  [20 bmp] 20 bmp Vt Set:  [500 mL] 500 mL PEEP:  [5 cmH20] 5 cmH20 Plateau Pressure:  [19 cmH20] 19 cmH20  LAB RESULTS:  Basic Metabolic Panel: Recent Labs  Lab 03/30/2020 1226 04/03/2020 1347 03/15/20 0419  NA 139  --  142  K 3.8  --  4.7  CL 101  --  105  CO2 21*  --  23  GLUCOSE 300*  --  313*  BUN 37*  --  36*  CREATININE 3.27* 3.50* 2.84*  CALCIUM 8.8*  --  7.1*  MG  --   --  2.0  PHOS  --   --  5.9*   Liver Function Tests: Recent Labs  Lab 04/03/2020 1226 03/15/20 0419  AST 27  --   ALT 12  --   ALKPHOS 57  --   BILITOT 1.1  --   PROT 6.2*  --   ALBUMIN 3.0* 2.1*   Recent Labs  Lab 04/04/2020 1226  LIPASE 35   No results for input(s): AMMONIA in the last 168 hours. CBC: Recent Labs  Lab 03/18/2020 1226 03/15/20 0419  WBC 5.8 8.2  NEUTROABS 4.5 6.6  HGB 14.0 11.8*  HCT 42.9 36.5*  MCV 89.9 91.7  PLT 283 200   Cardiac Enzymes: No results for input(s): CKTOTAL, CKMB, CKMBINDEX, TROPONINI in the last 168 hours. BNP: Invalid input(s): POCBNP CBG: Recent Labs  Lab 03/15/20 0436 03/15/20 0659 03/15/20 0854 03/15/20 1123 03/15/20 1659  GLUCAP 279* 277* 238* 194* 157*       IMAGING RESULTS:  Imaging: CT ABDOMEN PELVIS WO CONTRAST  Result Date: 03/20/2020 CLINICAL DATA:  64 year old male with history of abdominal pain. Suspected aortic dissection. EXAM: CT CHEST, ABDOMEN AND PELVIS WITHOUT CONTRAST TECHNIQUE: Multidetector CT imaging of the chest, abdomen and pelvis was performed following the standard protocol without IV contrast. COMPARISON:  No priors. FINDINGS: Comment: Today's study is  limited for detection and characterization of visceral and/or vascular lesions by lack of IV contrast material. CT CHEST FINDINGS Cardiovascular: Heart size is normal. There is no significant pericardial fluid, thickening or  pericardial calcification. Small amount of gas is present in the right ventricle and pulmonic trunk. There is aortic atherosclerosis, as well as atherosclerosis of the great vessels of the mediastinum and the coronary arteries, including calcified atherosclerotic plaque in the left anterior descending coronary artery. No crescentic high attenuation associated with the wall of the thoracic aorta to suggest acute intramural hemorrhage. Small amount of gas also noted in the left axillary vein. Mediastinum/Nodes: No pathologically enlarged mediastinal or hilar lymph nodes. Esophagus is unremarkable in appearance. No axillary lymphadenopathy. Lungs/Pleura: No suspicious appearing pulmonary nodules or masses are noted. No acute consolidative airspace disease. No pleural effusions. Linear areas of scarring and/or subsegmental atelectasis are noted in the dependent portions of the lungs bilaterally. Musculoskeletal: There are no aggressive appearing lytic or blastic lesions noted in the visualized portions of the skeleton. CT ABDOMEN PELVIS FINDINGS Hepatobiliary: Mild diffuse low attenuation throughout the hepatic parenchyma, indicative of hepatic steatosis. No definite discrete cystic or solid hepatic lesions are confidently identified on today's noncontrast CT examination. Unenhanced appearance of the gallbladder is normal. Pancreas: No definite pancreatic mass or peripancreatic fluid collections or inflammatory changes are noted on today's noncontrast CT examination. Spleen: Unremarkable. Adrenals/Urinary Tract: 1.7 cm low-attenuation lesion in the medial aspect of the lower pole of the left kidney, incompletely characterized on today's non-contrast CT examination, but statistically likely to  represent a cyst. Right kidney and bilateral adrenal glands are normal in appearance. No hydroureteronephrosis. Urinary bladder is normal in appearance. Stomach/Bowel: Normal appearance of the stomach. Multiple prominent borderline dilated loops of small bowel in the left side of the abdomen measuring up to 3.1 cm in diameter with some subjective wall thickening in the region of the jejunum and small air-fluid levels. Moderate distension of the colon with fluid and gas. Potential pneumatosis in the region of the cecum best appreciated on axial image 82 of series 508 and coronal image 65 of series 511. Appendix appears dilated measuring up to 1.4 cm in diameter. Vascular/Lymphatic: Aortic atherosclerosis. No lymphadenopathy noted in the abdomen or pelvis. Reproductive: Prostate gland and seminal vesicles are unremarkable in appearance. Other: Large volume of pneumoperitoneum. In addition, there are some unusual sites of extraluminal gas most notably in the low anatomic pelvis encircling the mesorectum, and in the spaces adjacent to the seminal vesicles, some of which may potentially be within venous structures. Small volume of ascites. Musculoskeletal: There are no aggressive appearing lytic or blastic lesions noted in the visualized portions of the skeleton. IMPRESSION: 1. Large amount of pneumoperitoneum indicative of bowel perforation. The exact site of perforation is uncertain on today's examination, however, the favored candidate sites for perforation include the cecum where there is potential pneumatosis, and the rectosigmoid region where there appears to be a small amount of gas in the veins associated with the seminal vesicles and a small amount of gas encircling the mesorectum. Surgical consultation is strongly recommended. 2. Gas right ventricle and pulmonic trunk, as well as the left axillary vein. Findings are favored to be iatrogenic related to IV access. However, there may be some venous gas in the low  anatomic pelvis, as discussed above. The possibility of ischemic bowel should be considered, and correlation with lactate levels is recommended. 3. Appendix is dilated measuring up to 14 mm in diameter. This is presumably secondary to reactive inflammation, however, attention at time of forthcoming exploratory laparotomy is recommended. 4. No definitive findings to suggest aortic dissection on today's noncontrast CT examination. 5. Hepatic steatosis. 6. Additional incidental  findings, as above. Critical Value/emergent results were called by telephone at the time of interpretation on 03/17/2020 at 2:38 pm to provider MARK QUALE, who verbally acknowledged these results. Electronically Signed   By: Vinnie Langton M.D.   On: 04/06/2020 14:44   CT CHEST WO CONTRAST  Result Date: 04/08/2020 CLINICAL DATA:  64 year old male with history of abdominal pain. Suspected aortic dissection. EXAM: CT CHEST, ABDOMEN AND PELVIS WITHOUT CONTRAST TECHNIQUE: Multidetector CT imaging of the chest, abdomen and pelvis was performed following the standard protocol without IV contrast. COMPARISON:  No priors. FINDINGS: Comment: Today's study is limited for detection and characterization of visceral and/or vascular lesions by lack of IV contrast material. CT CHEST FINDINGS Cardiovascular: Heart size is normal. There is no significant pericardial fluid, thickening or pericardial calcification. Small amount of gas is present in the right ventricle and pulmonic trunk. There is aortic atherosclerosis, as well as atherosclerosis of the great vessels of the mediastinum and the coronary arteries, including calcified atherosclerotic plaque in the left anterior descending coronary artery. No crescentic high attenuation associated with the wall of the thoracic aorta to suggest acute intramural hemorrhage. Small amount of gas also noted in the left axillary vein. Mediastinum/Nodes: No pathologically enlarged mediastinal or hilar lymph nodes.  Esophagus is unremarkable in appearance. No axillary lymphadenopathy. Lungs/Pleura: No suspicious appearing pulmonary nodules or masses are noted. No acute consolidative airspace disease. No pleural effusions. Linear areas of scarring and/or subsegmental atelectasis are noted in the dependent portions of the lungs bilaterally. Musculoskeletal: There are no aggressive appearing lytic or blastic lesions noted in the visualized portions of the skeleton. CT ABDOMEN PELVIS FINDINGS Hepatobiliary: Mild diffuse low attenuation throughout the hepatic parenchyma, indicative of hepatic steatosis. No definite discrete cystic or solid hepatic lesions are confidently identified on today's noncontrast CT examination. Unenhanced appearance of the gallbladder is normal. Pancreas: No definite pancreatic mass or peripancreatic fluid collections or inflammatory changes are noted on today's noncontrast CT examination. Spleen: Unremarkable. Adrenals/Urinary Tract: 1.7 cm low-attenuation lesion in the medial aspect of the lower pole of the left kidney, incompletely characterized on today's non-contrast CT examination, but statistically likely to represent a cyst. Right kidney and bilateral adrenal glands are normal in appearance. No hydroureteronephrosis. Urinary bladder is normal in appearance. Stomach/Bowel: Normal appearance of the stomach. Multiple prominent borderline dilated loops of small bowel in the left side of the abdomen measuring up to 3.1 cm in diameter with some subjective wall thickening in the region of the jejunum and small air-fluid levels. Moderate distension of the colon with fluid and gas. Potential pneumatosis in the region of the cecum best appreciated on axial image 82 of series 508 and coronal image 65 of series 511. Appendix appears dilated measuring up to 1.4 cm in diameter. Vascular/Lymphatic: Aortic atherosclerosis. No lymphadenopathy noted in the abdomen or pelvis. Reproductive: Prostate gland and seminal  vesicles are unremarkable in appearance. Other: Large volume of pneumoperitoneum. In addition, there are some unusual sites of extraluminal gas most notably in the low anatomic pelvis encircling the mesorectum, and in the spaces adjacent to the seminal vesicles, some of which may potentially be within venous structures. Small volume of ascites. Musculoskeletal: There are no aggressive appearing lytic or blastic lesions noted in the visualized portions of the skeleton. IMPRESSION: 1. Large amount of pneumoperitoneum indicative of bowel perforation. The exact site of perforation is uncertain on today's examination, however, the favored candidate sites for perforation include the cecum where there is potential pneumatosis, and the rectosigmoid  region where there appears to be a small amount of gas in the veins associated with the seminal vesicles and a small amount of gas encircling the mesorectum. Surgical consultation is strongly recommended. 2. Gas right ventricle and pulmonic trunk, as well as the left axillary vein. Findings are favored to be iatrogenic related to IV access. However, there may be some venous gas in the low anatomic pelvis, as discussed above. The possibility of ischemic bowel should be considered, and correlation with lactate levels is recommended. 3. Appendix is dilated measuring up to 14 mm in diameter. This is presumably secondary to reactive inflammation, however, attention at time of forthcoming exploratory laparotomy is recommended. 4. No definitive findings to suggest aortic dissection on today's noncontrast CT examination. 5. Hepatic steatosis. 6. Additional incidental findings, as above. Critical Value/emergent results were called by telephone at the time of interpretation on 04/03/2020 at 2:38 pm to provider MARK QUALE, who verbally acknowledged these results. Electronically Signed   By: Vinnie Langton M.D.   On: 04/07/2020 14:44   DG Chest Port 1 View  Result Date:  03/22/2020 CLINICAL DATA:  Central line placement EXAM: PORTABLE CHEST 1 VIEW COMPARISON:  12:52 p.m. FINDINGS: Right internal jugular central venous catheter is in place with its tip at the superior cavoatrial junction. Endotracheal tube is in place with its tip at the level of the clavicular heads, 6.8 cm above the carina. Nasogastric tube extends into the left upper quadrant the abdomen. Lung volumes are small, but are symmetric. Retrocardiac opacification likely relates to atelectasis within this region. No pneumothorax or pleural effusion. Cardiac size within normal limits. Pulmonary vascularity normal. IMPRESSION: 1. Right internal jugular central venous catheter tip at the superior cavoatrial junction. No pneumothorax. 2. Endotracheal tube and nasogastric tube in good position. 3. Retrocardiac opacification, likely atelectasis. Electronically Signed   By: Fidela Salisbury MD   On: 04/06/2020 20:31   DG Chest Port 1 View  Result Date: 03/19/2020 CLINICAL DATA:  Questionable sepsis, abdominal pain, evaluate for free air. EXAM: PORTABLE CHEST 1 VIEW COMPARISON:  No pertinent prior exams are available for comparison. FINDINGS: Heart size within normal limits. Aortic atherosclerosis. There is free intraperitoneal air beneath the diaphragm bilaterally. Elevation of the hemidiaphragms bilaterally with minimal bibasilar atelectasis. The lungs are otherwise clear. No evidence of pleural effusion or pneumothorax. No acute bony abnormality identified. These results were called by telephone at the time of interpretation on 03/24/2020 at 1:15 pm to provider MARK QUALE , who verbally acknowledged these results. IMPRESSION: Intraperitoneal free air beneath both hemidiaphragms concerning for perforated viscus. Elevation of the hemidiaphragms bilaterally with minimal bibasilar atelectasis. The lungs are otherwise clear. Aortic Atherosclerosis (ICD10-I70.0). Electronically Signed   By: Kellie Simmering DO   On: 03/24/2020 13:17    '@[facility]' @ DG Chest Port 1 View  Result Date: 04/08/2020 CLINICAL DATA:  Central line placement EXAM: PORTABLE CHEST 1 VIEW COMPARISON:  12:52 p.m. FINDINGS: Right internal jugular central venous catheter is in place with its tip at the superior cavoatrial junction. Endotracheal tube is in place with its tip at the level of the clavicular heads, 6.8 cm above the carina. Nasogastric tube extends into the left upper quadrant the abdomen. Lung volumes are small, but are symmetric. Retrocardiac opacification likely relates to atelectasis within this region. No pneumothorax or pleural effusion. Cardiac size within normal limits. Pulmonary vascularity normal. IMPRESSION: 1. Right internal jugular central venous catheter tip at the superior cavoatrial junction. No pneumothorax. 2. Endotracheal tube and nasogastric tube  in good position. 3. Retrocardiac opacification, likely atelectasis. Electronically Signed   By: Fidela Salisbury MD   On: 04/03/2020 20:31     ASSESSMENT AND PLAN    -Multidisciplinary rounds held today  Septic shock    - present on admission    - due to intra-abdominal infection - s/p surgery - aspiration of >700cc purulent fluid - sent to lab for micro   -ABG, trend lactate   - CVP trend if IJ-TLC   -empiric abx-zosyn   -vasopressor support   - LR to euvolemia    -amp albumin 25% daily     -solucortef 100bid   - DIC workup   -s/p IVF 6L , continue LR for now patient only on 40% and TTE is pending.   Acute bowel perforation with pneumoperitoneum -discussed with surgeon - appreciate input - Dr Peyton Najjar  - s/p surgery -POD-0 - follow surgical recs   - open abdomen  -ICU monitoring   Acute Renal Failure stage 3-most likely due to septic nephropathy -follow chem 7 -follow UO -continue Foley Catheter-assess need daily   Moderate persistent asthma  - albuterol neb q6h   -abg .=7.26/43/119/19 on 60%  ID -continue IV abx as prescibed -follow up  cultures  GI/Nutrition GI PROPHYLAXIS as indicated DIET-->TF's as tolerated Constipation protocol as indicated  ENDO - ICU hypoglycemic\Hyperglycemia protocol -check FSBS per protocol   ELECTROLYTES -follow labs as needed -replace as needed -pharmacy consultation   DVT/GI PRX ordered -SCDs  TRANSFUSIONS AS NEEDED MONITOR FSBS ASSESS the need for LABS as needed   Critical care provider statement:    Critical care time (minutes):  33   Critical care time was exclusive of:  Separately billable procedures and treating other patients   Critical care was necessary to treat or prevent imminent or life-threatening deterioration of the following conditions:  perforated bowel, pneumoperitoneum, septic shock, AKI.   Critical care was time spent personally by me on the following activities:  Development of treatment plan with patient or surrogate, discussions with consultants, evaluation of patient's response to treatment, examination of patient, obtaining history from patient or surrogate, ordering and performing treatments and interventions, ordering and review of laboratory studies and re-evaluation of patient's condition.  I assumed direction of critical care for this patient from another provider in my specialty: no    This document was prepared using Dragon voice recognition software and may include unintentional dictation errors.    Ottie Glazier, M.D.  Division of [facility]

## 2020-03-15 NOTE — Progress Notes (Signed)
Assisted tele visit to patient with family member.  Lianette Broussard P, RN  

## 2020-03-16 ENCOUNTER — Encounter: Payer: Self-pay | Admitting: General Surgery

## 2020-03-16 ENCOUNTER — Encounter: Admission: EM | Disposition: E | Payer: Self-pay | Source: Home / Self Care | Attending: General Surgery

## 2020-03-16 ENCOUNTER — Inpatient Hospital Stay (HOSPITAL_COMMUNITY)
Admit: 2020-03-16 | Discharge: 2020-03-16 | Disposition: A | Discharge status: Home / Self Care | Payer: BC Managed Care – PPO | Attending: Pulmonary Disease | Admitting: Pulmonary Disease

## 2020-03-16 ENCOUNTER — Inpatient Hospital Stay: Payer: BC Managed Care – PPO | Admitting: Anesthesiology

## 2020-03-16 ENCOUNTER — Other Ambulatory Visit: Payer: Self-pay | Admitting: General Surgery

## 2020-03-16 DIAGNOSIS — I5021 Acute systolic (congestive) heart failure: Secondary | ICD-10-CM

## 2020-03-16 HISTORY — PX: BOWEL RESECTION: SHX1257

## 2020-03-16 HISTORY — PX: WOUND DEBRIDEMENT: SHX247

## 2020-03-16 HISTORY — PX: ILEOSTOMY: SHX1783

## 2020-03-16 LAB — BLOOD CULTURE ID PANEL (REFLEXED) - BCID2

## 2020-03-16 LAB — HEPATIC FUNCTION PANEL
ALT: 83 U/L — ABNORMAL HIGH (ref 0–44)
AST: 113 U/L — ABNORMAL HIGH (ref 15–41)
Albumin: 2 g/dL — ABNORMAL LOW (ref 3.5–5.0)
Alkaline Phosphatase: 61 U/L (ref 38–126)
Bilirubin, Direct: 0.2 mg/dL (ref 0.0–0.2)
Indirect Bilirubin: 0.3 mg/dL (ref 0.3–0.9)
Total Bilirubin: 0.5 mg/dL (ref 0.3–1.2)
Total Protein: 5 g/dL — ABNORMAL LOW (ref 6.5–8.1)

## 2020-03-16 LAB — MAGNESIUM: Magnesium: 2 mg/dL (ref 1.7–2.4)

## 2020-03-16 LAB — GLUCOSE, CAPILLARY
Glucose-Capillary: 133 mg/dL — ABNORMAL HIGH (ref 70–99)
Glucose-Capillary: 94 mg/dL (ref 70–99)
Glucose-Capillary: 90 mg/dL (ref 70–99)
Glucose-Capillary: 51 mg/dL — ABNORMAL LOW (ref 70–99)
Glucose-Capillary: 107 mg/dL — ABNORMAL HIGH (ref 70–99)
Glucose-Capillary: 62 mg/dL — ABNORMAL LOW (ref 70–99)
Glucose-Capillary: 69 mg/dL — ABNORMAL LOW (ref 70–99)

## 2020-03-16 LAB — RENAL FUNCTION PANEL
Albumin: 2.1 g/dL — ABNORMAL LOW (ref 3.5–5.0)
Anion gap: 9 (ref 5–15)
BUN: 34 mg/dL — ABNORMAL HIGH (ref 8–23)
CO2: 23 mmol/L (ref 22–32)
Calcium: 6.6 mg/dL — ABNORMAL LOW (ref 8.9–10.3)
Chloride: 113 mmol/L — ABNORMAL HIGH (ref 98–111)
Creatinine, Ser: 2.16 mg/dL — ABNORMAL HIGH (ref 0.61–1.24)
GFR calc Af Amer: 36 mL/min — ABNORMAL LOW (ref >60)
GFR calc non Af Amer: 31 mL/min — ABNORMAL LOW (ref >60)
Glucose, Bld: 135 mg/dL — ABNORMAL HIGH (ref 70–99)
Phosphorus: 3.9 mg/dL (ref 2.5–4.6)
Potassium: 5 mmol/L (ref 3.5–5.1)
Sodium: 145 mmol/L (ref 135–145)

## 2020-03-16 LAB — ECHOCARDIOGRAM COMPLETE
AR max vel: 4.74 cm2
AV Area VTI: 3.97 cm2
AV Area mean vel: 5 cm2
AV Mean grad: 2 mmHg
AV Peak grad: 3.4 mmHg
Ao pk vel: 0.92 m/s
Area-P 1/2: 3.43 cm2
Height: 68 in
S' Lateral: 3.29 cm
Weight: 3056 oz

## 2020-03-16 LAB — CBC WITH DIFFERENTIAL/PLATELET
Abs Immature Granulocytes: 0 10*3/uL (ref 0.00–0.07)
Basophils Absolute: 0 10*3/uL (ref 0.0–0.1)
Basophils Relative: 0 %
Eosinophils Absolute: 0 10*3/uL (ref 0.0–0.5)
Eosinophils Relative: 0 %
HCT: 34.3 % — ABNORMAL LOW (ref 39.0–52.0)
Hemoglobin: 10.9 g/dL — ABNORMAL LOW (ref 13.0–17.0)
Immature Granulocytes: 0 %
Lymphocytes Relative: 4 %
Lymphs Abs: 0.7 10*3/uL (ref 0.7–4.0)
MCH: 29.5 pg (ref 26.0–34.0)
MCHC: 31.8 g/dL (ref 30.0–36.0)
MCV: 93 fL (ref 80.0–100.0)
Monocytes Absolute: 1 10*3/uL (ref 0.1–1.0)
Monocytes Relative: 6 %
Neutro Abs: 15.4 10*3/uL — ABNORMAL HIGH (ref 1.7–7.7)
Neutrophils Relative %: 90 %
Platelets: 156 10*3/uL (ref 150–400)
RBC: 3.69 MIL/uL — ABNORMAL LOW (ref 4.22–5.81)
RDW: 13.6 % (ref 11.5–15.5)
Smear Review: NORMAL
WBC: 17.1 10*3/uL — ABNORMAL HIGH (ref 4.0–10.5)
nRBC: 0.1 % (ref 0.0–0.2)

## 2020-03-16 LAB — LACTIC ACID, PLASMA: Lactic Acid, Venous: 2.1 mmol/L (ref 0.5–1.9)

## 2020-03-16 SURGERY — CREATION, ILEOSTOMY
Anesthesia: General

## 2020-03-16 MED ORDER — LIDOCAINE HCL (PF) 2 % IJ SOLN
INTRAMUSCULAR | Status: AC
  Filled 2020-03-16: qty 5

## 2020-03-16 MED ORDER — SODIUM CHLORIDE 0.9 % IV SOLN
1.0000 g | Freq: Two times a day (BID) | INTRAVENOUS | Status: DC
  Administered 2020-03-16 – 2020-03-17 (×3): 1 g via INTRAVENOUS
  Filled 2020-03-16 (×6): qty 1

## 2020-03-16 MED ORDER — MIDAZOLAM HCL 2 MG/2ML IJ SOLN
INTRAMUSCULAR | Status: AC
  Filled 2020-03-16: qty 2

## 2020-03-16 MED ORDER — SODIUM CHLORIDE 0.9 % IV SOLN
100.0000 mg | INTRAVENOUS | Status: AC
  Administered 2020-03-17 – 2020-03-22 (×6): 100 mg via INTRAVENOUS
  Filled 2020-03-16 (×6): qty 100

## 2020-03-16 MED ORDER — ONDANSETRON HCL 4 MG/2ML IJ SOLN
INTRAMUSCULAR | Status: DC | PRN
  Administered 2020-03-16: 4 mg via INTRAVENOUS

## 2020-03-16 MED ORDER — DEXTROSE IN LACTATED RINGERS 5 % IV SOLN: INTRAVENOUS | Status: DC

## 2020-03-16 MED ORDER — SODIUM CHLORIDE 0.9 % IV SOLN
200.0000 mg | Freq: Once | INTRAVENOUS | Status: AC
  Administered 2020-03-16: 200 mg via INTRAVENOUS
  Filled 2020-03-16: qty 200

## 2020-03-16 MED ORDER — DEXTROSE 50 % IV SOLN: 12.5000 g | INTRAVENOUS | Status: AC

## 2020-03-16 MED ORDER — ACETAMINOPHEN 10 MG/ML IV SOLN
1000.0000 mg | Freq: Four times a day (QID) | INTRAVENOUS | Status: AC | PRN
  Filled 2020-03-16: qty 100

## 2020-03-16 MED ORDER — DEXTROSE 50 % IV SOLN
INTRAVENOUS | Status: AC
  Administered 2020-03-16: 50 mL
  Filled 2020-03-16: qty 50

## 2020-03-16 MED ORDER — DEXTROSE 50 % IV SOLN
INTRAVENOUS | Status: AC
  Filled 2020-03-16: qty 50

## 2020-03-16 MED ORDER — ROCURONIUM BROMIDE 100 MG/10ML IV SOLN
INTRAVENOUS | Status: DC | PRN
  Administered 2020-03-16: 50 mg via INTRAVENOUS

## 2020-03-16 MED ORDER — MIDAZOLAM HCL 2 MG/2ML IJ SOLN
INTRAMUSCULAR | Status: DC | PRN
  Administered 2020-03-16 (×2): 2 mg via INTRAVENOUS

## 2020-03-16 MED ORDER — ACETAMINOPHEN 10 MG/ML IV SOLN
INTRAVENOUS | Status: AC
  Filled 2020-03-16: qty 100

## 2020-03-16 MED ORDER — ACETAMINOPHEN 10 MG/ML IV SOLN
INTRAVENOUS | Status: DC | PRN
  Administered 2020-03-16: 1000 mg via INTRAVENOUS

## 2020-03-16 MED ORDER — PROPOFOL 10 MG/ML IV BOLUS
INTRAVENOUS | Status: AC
  Filled 2020-03-16: qty 20

## 2020-03-16 SURGICAL SUPPLY — 41 items
APL PRP STRL LF DISP 70% ISPRP (MISCELLANEOUS)
BRR SKN FLT 1.75X1.25 2 PC (OSTOMY) ×1
CANISTER SUCT 1200ML W/VALVE (MISCELLANEOUS) ×3 IMPLANT
CANISTER WOUND CARE 500ML ATS (WOUND CARE) ×3 IMPLANT
CHLORAPREP W/TINT 26 (MISCELLANEOUS) IMPLANT
COVER WAND RF STERILE (DRAPES) ×3 IMPLANT
DRAPE LAPAROTOMY 100X77 ABD (DRAPES) ×3 IMPLANT
DRSG OPSITE POSTOP 4X10 (GAUZE/BANDAGES/DRESSINGS) IMPLANT
DRSG OPSITE POSTOP 4X8 (GAUZE/BANDAGES/DRESSINGS) IMPLANT
DRSG TEGADERM 4X10 (GAUZE/BANDAGES/DRESSINGS) IMPLANT
DRSG TELFA 3X8 NADH (GAUZE/BANDAGES/DRESSINGS) IMPLANT
DRSG VAC ATS LRG SENSATRAC (GAUZE/BANDAGES/DRESSINGS) ×3 IMPLANT
ELECT REM PT RETURN 9FT ADLT (ELECTROSURGICAL) ×3
ELECTRODE REM PT RTRN 9FT ADLT (ELECTROSURGICAL) ×1 IMPLANT
GLOVE BIO SURGEON STRL SZ 6.5 (GLOVE) ×2 IMPLANT
GLOVE BIO SURGEONS STRL SZ 6.5 (GLOVE) ×1
GLOVE BIOGEL PI IND STRL 6.5 (GLOVE) ×1 IMPLANT
GLOVE BIOGEL PI INDICATOR 6.5 (GLOVE) ×2
GOWN STRL REUS W/ TWL LRG LVL3 (GOWN DISPOSABLE) ×2 IMPLANT
GOWN STRL REUS W/TWL LRG LVL3 (GOWN DISPOSABLE) ×6
KIT TURNOVER KIT A (KITS) ×3 IMPLANT
LABEL OR SOLS (LABEL) IMPLANT
LIGASURE IMPACT 36 18CM CVD LR (INSTRUMENTS) ×3 IMPLANT
NS IRRIG 1000ML POUR BTL (IV SOLUTION) ×6 IMPLANT
PACK BASIN MAJOR ARMC (MISCELLANEOUS) ×3 IMPLANT
PACK COLON CLEAN CLOSURE (MISCELLANEOUS) IMPLANT
POUCH DRAIN 1 3/4 SMALL GREEN (OSTOMY) ×3 IMPLANT
RETAINER VISCERA MED (MISCELLANEOUS) ×3 IMPLANT
SPONGE LAP 18X18 RF (DISPOSABLE) IMPLANT
STAPLER PROXIMATE 55 BLUE (STAPLE) ×3 IMPLANT
SUT PDS AB 0 CT1 27 (SUTURE) ×6 IMPLANT
SUT PDS AB 1 TP1 54 (SUTURE) IMPLANT
SUT SILK 2 0 (SUTURE)
SUT SILK 2-0 18XBRD TIE 12 (SUTURE) IMPLANT
SUT SILK 3 0 (SUTURE)
SUT SILK 3-0 18XBRD TIE 12 (SUTURE) IMPLANT
SUT VIC AB 3-0 SH 27 (SUTURE)
SUT VIC AB 3-0 SH 27X BRD (SUTURE) IMPLANT
SUT VICRYL 3-0 CR8 SH (SUTURE) ×6 IMPLANT
TRAY FOLEY MTR SLVR 16FR STAT (SET/KITS/TRAYS/PACK) IMPLANT
WAFER FLANGE 1 3/4 SMALL GREEN (OSTOMY) ×3 IMPLANT

## 2020-03-16 NOTE — Progress Notes (Signed)
*  PRELIMINARY RESULTS* Echocardiogram 2D Echocardiogram has been performed.  Donald Stephenson 04/03/2020, 3:07 PM

## 2020-03-16 NOTE — Progress Notes (Signed)
Initial Nutrition Assessment  DOCUMENTATION CODES:   Not applicable  INTERVENTION:  Will monitor for nutrition plan of care per surgery.  NUTRITION DIAGNOSIS:   Inadequate oral intake related to inability to eat as evidenced by NPO status.  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   Patient is currently intubated on ventilator support MV: 10.2 L/min Temp (24hrs), Avg:101.3 F (38.5 C), Min:101.1 F (38.4 C), Max:101.8 F (38.8 C)  Medications reviewed and include: Solu-Cortef 100 mg Q12hrs IV, Novolog 0-9 units Q4hrs, Levemir 10 units BID, Protonix, NS at 150 mL/hr, human albumin 12.5 grams daily IV, fentanyl gtt, norepinephrine gtt at 36 mcg/min, phenylephrine gtt now off, Zosyn, vasopressin gtt at 0.03 units/min.  Labs reviewed: CBG 94-136, Chloride 113, BUN 34, Creatinine 2.16, Lactic acid 2.1.  I/O: 2275 mL UOP yesterday (1.1 mL/kg/hr)  Enteral Access: NGT to LIS  IV Access: right IJ CVC triple lumen placed 9/1  Patient does not meet criteria for malnutrition at this time. Not appropriate for enteral nutrition. Will monitor for nutrition plan of care per surgery.  Discussed with RN. Plan is for patient to return to OR today.  NUTRITION - FOCUSED PHYSICAL EXAM:    Most Recent Value  Orbital Region No depletion  Upper Arm Region No depletion  Thoracic and Lumbar Region Unable to assess  Buccal Region Unable to assess  Temple Region No depletion  Clavicle Bone Region No depletion  Clavicle and Acromion Bone Region No depletion  Scapular Bone Region Unable to assess  Dorsal Hand No depletion   Patellar Region No depletion  Anterior Thigh Region No depletion  Posterior Calf Region No depletion  Edema (RD Assessment) None  Hair Reviewed  Eyes Unable to assess  Mouth Unable to assess  Skin Reviewed  Nails Reviewed     Diet Order:   Diet Order            Diet NPO time specified  Diet effective now                EDUCATION NEEDS:   No education needs have been identified at this time  Skin:  Skin Assessment: Skin Integrity Issues: Skin Integrity Issues:: Incisions Incisions: closed incision to abdomen with wound VAC  Last BM:  Unknown/PTA  Height:   Ht Readings from Last 1 Encounters:  04/12/2020 5\' 8"  (1.727 m)   Weight:   Wt Readings from Last 1 Encounters:  04/11/2020 86.6 kg   Ideal Body Weight:  70 kg  BMI:  Body mass index is 29.04 kg/m.  Estimated Nutritional Needs:   Kcal:  2144  Protein:  120-130 grams  Fluid:  2.2-2.5 L/day  Jacklynn Barnacle, MS, RD, LDN Pager number available on [employer]

## 2020-03-16 NOTE — Transfer of Care (Signed)
Immediate Anesthesia Transfer of Care Note  Patient: Donald Stephenson.  Procedure(s) Performed: ILEOSTOMY CREATION (N/A ) DEBRIDEMENT CLOSURE/ABDOMINAL WOUND (N/A ) SMALL BOWEL RESECTION (N/A )  Patient Location: PACU  Anesthesia Type:General  Level of Consciousness: unresponsive  Airway & Oxygen Therapy: Patient remains intubated per anesthesia plan  Post-op Assessment: Report given to RN and Post -op Vital signs reviewed and stable  Post vital signs: Reviewed and stable  Last Vitals:  Vitals Value Taken Time  BP    Temp    Pulse 81 03/19/2020 1235  Resp 20 03/31/2020 1235  SpO2 97 % 03/25/2020 1235  Vitals shown include unvalidated device data.  Last Pain:  Vitals:   04/08/2020 0200  TempSrc: Esophageal  PainSc:          Complications: No complications documented.

## 2020-03-16 NOTE — Consult Note (Signed)
CRITICAL CARE PROGRESS NOTE    Name: Ramesh Moan. MRN: [id number] DOB: Jul 17, 1955     LOS: 2   SUBJECTIVE FINDINGS & SIGNIFICANT EVENTS    Patient description:  64yo with PMH as below came in for abdominal pain and lethargy, found to be in shock and had CXR with air under the diaphragm. CT abd with bowel perforation , patient taken to OR emergently found to have 700cc mucopurulent material s/p surgery with resection and post operative septic shock. Patient brought to MICU on MV with open abdomen for medical management.   03/15/20- patient remains critically ill on MV, on elevated settings with vasopressor support. Spoke to wife and daughter at bedside today. Severe hyperglycemia will add on levemir maintanance. Renal function improved slightly. Reviwed short term care plan with Dr Peyton Najjar.  03/25/2020- Patient scheduled for OR today. Reviewed short term care plan with wife. Letter for out of work excuse provided. Discussed case with Anesthesiologist this am.   Patient febrile this am. Leukocystosis trending up partially due to demargination from solucortef therapy, weaned off neosynephrine overnight. Flud balance 8L positive. DIC excluded. Hepatic panel in process if normal will deliver Ofirmev.  Lines/tubes : Airway 7.5 mm (Active)  Secured at (cm) 21 cm 04/08/2020 0000     Arterial Line 04/09/2020 Left Radial (Active)     Negative Pressure Wound Therapy Abdomen Mid (Active)     Urethral Catheter lisa godwin rn Double-lumen;Straight-tip 16 Fr. (Active)    Microbiology/Sepsis markers: Results for orders placed or performed during the hospital encounter of 03/25/2020  SARS Coronavirus 2 by RT PCR (hospital order, performed in [facility] hospital lab) Nasopharyngeal Nasopharyngeal Swab     Status: None   Collection  Time: 04/11/2020 12:26 PM   Specimen: Nasopharyngeal Swab  Result Value Ref Range Status   SARS Coronavirus 2 NEGATIVE NEGATIVE Final    Comment: (NOTE) SARS-CoV-2 target nucleic acids are NOT DETECTED.  The SARS-CoV-2 RNA is generally detectable in upper and lower respiratory specimens during the acute phase of infection. The lowest concentration of SARS-CoV-2 viral copies this assay can detect is 250 copies / mL. A negative result does not preclude SARS-CoV-2 infection and should not be used as the sole basis for treatment or other patient management decisions.  A negative result may occur with improper specimen collection / handling, submission of specimen other than nasopharyngeal swab, presence of viral mutation(s) within the areas targeted by this assay, and inadequate number of viral copies (<250 copies / mL). A negative result must be combined with clinical observations, patient history, and epidemiological information.  Fact Sheet for Patients:   StrictlyIdeas.no  Fact Sheet for Healthcare Providers: BankingDealers.co.za  This test is not yet approved or  cleared by the [place] FDA and has been authorized for detection and/or diagnosis of SARS-CoV-2 by FDA under an Emergency Use Authorization (EUA).  This EUA will remain in effect (meaning this test can be used) for the duration of the COVID-19 declaration under Section 564(b)(1) of the Act, 21 U.S.C. section 360bbb-3(b)(1), unless the authorization is terminated or revoked sooner.  Performed at [facility], [address] [place], [place] [address]   Blood culture (routine single)     Status: None (Preliminary result)   Collection Time: 04/12/2020  1:39 PM   Specimen: BLOOD  Result Value Ref Range Status   Specimen Description BLOOD BLOOD RIGHT FOREARM  Final   Special Requests   Final    BOTTLES DRAWN AEROBIC AND ANAEROBIC  Blood Culture results may not be  optimal due to an excessive volume of blood received in culture bottles   Culture   Final    NO GROWTH 2 DAYS Performed at [facility], [address] [place], [place] [address]    Report Status PENDING  Incomplete  Aerobic/Anaerobic Culture (surgical/deep wound)     Status: None (Preliminary result)   Collection Time: 04/10/2020  3:18 PM   Specimen: PATH Other; Body Fluid  Result Value Ref Range Status   Specimen Description   Final    PERITONEAL Performed at [facility], [address], [place], [place] [address]    Special Requests   Final    PERIOTTONEAL CAVITY FLUID Performed at [facility], [place], [place] [address]    Gram Stain   Final    MODERATE WBC PRESENT, PREDOMINANTLY PMN ABUNDANT GRAM NEGATIVE RODS FEW GRAM POSITIVE COCCI RARE GRAM POSITIVE RODS Performed at [facility], [place] [address], [place], [place] [address]    Culture PENDING  Incomplete   Report Status PENDING  Incomplete  MRSA PCR Screening     Status: None   Collection Time: 03/19/2020  6:21 PM   Specimen: Nasopharyngeal  Result Value Ref Range Status   MRSA by PCR NEGATIVE NEGATIVE Final    Comment:        The GeneXpert MRSA Assay (FDA approved for NASAL specimens only), is one component of a comprehensive MRSA colonization surveillance program. It is not intended to diagnose MRSA infection nor to guide or monitor treatment for MRSA infections. Performed at [facility], [address] [place],  [address]     Anti-infectives:  Anti-infectives (From admission, onward)   Start     Dose/Rate Route Frequency Ordered Stop   03/15/20 1400  piperacillin-tazobactam (ZOSYN) IVPB 3.375 g        3.375 g 12.5 mL/hr over 240 Minutes Intravenous Every 8 hours 03/15/20 0845     03/20/2020 2230  piperacillin-tazobactam (ZOSYN) IVPB 3.375 g  Status:  Discontinued        3.375 g 12.5 mL/hr over 240 Minutes Intravenous Every 8 hours  04/09/2020 1428 04/11/2020 1822   04/02/2020 2200  piperacillin-tazobactam (ZOSYN) IVPB 3.375 g  Status:  Discontinued        3.375 g 12.5 mL/hr over 240 Minutes Intravenous Every 12 hours 03/20/2020 1822 03/15/20 0845   03/22/2020 1330  piperacillin-tazobactam (ZOSYN) IVPB 3.375 g        3.375 g 100 mL/hr over 30 Minutes Intravenous STAT 04/08/2020 1328 03/23/2020 1450        PAST MEDICAL HISTORY   Past Medical History:  Diagnosis Date  . Arthritis   . Asthma    as a child-no problems since childhood  . Diabetes mellitus without complication ([place])   . Hypertension      SURGICAL HISTORY   Past Surgical History:  Procedure Laterality Date  . LAPAROTOMY N/A 04/02/2020   Procedure: EXPLORATORY LAPAROTOMY SUBTOTAL COLECTOMY NEGATIVE PRESSURE DRESSING PLACEMENT ;  Surgeon: Herbert Pun, MD;  Location: [facility] ORS;  Service: General;  Laterality: N/A;  . NO PAST SURGERIES    . TOTAL HIP ARTHROPLASTY Left 04/19/2019   Procedure: TOTAL HIP ARTHROPLASTY ANTERIOR APPROACH;  Surgeon: Hessie Knows, MD;  Location: [facility] ORS;  Service: Orthopedics;  Laterality: Left;     FAMILY HISTORY   History reviewed. No pertinent family history.   SOCIAL HISTORY   Social History   Tobacco Use  .  Smoking status: Former Smoker    Years: 2.00    Types: Cigarettes    Quit date: 04/10/1969    Years since quitting: 50.9  . Smokeless tobacco: Never Used  Vaping Use  . Vaping Use: Never used  Substance Use Topics  . Alcohol use: Yes    Comment: 2-3 shots and 1 beer daily  . Drug use: Yes    Frequency: 5.0 times per week    Types: Marijuana     MEDICATIONS   Current Medication:  Current Facility-Administered Medications:  .  0.9 %  sodium chloride infusion, , Intravenous, Continuous, Cintron-Diaz, Edgardo, MD, Last Rate: 150 mL/hr at 03/20/2020 0352, New Bag at 03/15/2020 0352 .  acetaminophen (OFIRMEV) IV 1,000 mg, 1,000 mg, Intravenous, Q6H PRN, Herbert Pun, MD .  acetaminophen  (TYLENOL) tablet 650 mg, 650 mg, Oral, Q6H PRN **OR** acetaminophen (TYLENOL) suppository 650 mg, 650 mg, Rectal, Q6H PRN, Herbert Pun, MD, 650 mg at 03/24/2020 0343 .  albumin human 25 % solution 12.5 g, 12.5 g, Intravenous, Daily, [place] Gins, Tamya Denardo, MD, Last Rate: 60 mL/hr at 03/31/2020 0946, 12.5 g at 03/21/2020 0946 .  chlorhexidine gluconate (MEDLINE KIT) (PERIDEX) 0.12 % solution 15 mL, 15 mL, Mouth Rinse, BID, [place] Gins, Delvecchio Madole, MD, 15 mL at 03/17/2020 0842 .  Chlorhexidine Gluconate Cloth 2 % PADS 6 each, 6 each, Topical, Daily, Awilda Bill, NP, 6 each at 03/15/20 1130 .  fentaNYL 2575mg in NS 257m(1035mml) infusion-PREMIX, 0-400 mcg/hr, Intravenous, Continuous, Peggy Monk, MD, Last Rate: 27.5 mL/hr at 03/24/2020 0735, 275 mcg/hr at 04/05/2020 0735 .  heparin injection 5,000 Units, 5,000 Units, Subcutaneous, Q8H, CinHerbert PunD, 5,000 Units at 03/25/2020 0707 .  HYDROcodone-acetaminophen (NORCO/VICODIN) 5-325 MG per tablet 1-2 tablet, 1-2 tablet, Oral, Q4H PRN, CinHerbert PunD .  hydrocortisone sodium succinate (SOLU-CORTEF) 100 MG injection 100 mg, 100 mg, Intravenous, Q12H, AleLanney Ginsuad, MD, 100 mg at 03/25/2020 0706 .  insulin aspart (novoLOG) injection 0-9 Units, 0-9 Units, Subcutaneous, Q4H, GruDallie PilesPH, 1 Units at 04/01/2020 0343 .  insulin detemir (LEVEMIR) injection 10 Units, 10 Units, Subcutaneous, BID, AleOttie GlazierD, 10 Units at 04/12/2020 1009 .  MEDLINE mouth rinse, 15 mL, Mouth Rinse, 10 times per day, AleOttie GlazierD, 15 mL at 04/01/2020 0945 .  metoprolol tartrate (LOPRESSOR) tablet 12.5 mg, 12.5 mg, Oral, Daily, Cintron-Diaz, Edgardo, MD .  midazolam (VERSED) injection 2 mg, 2 mg, Intravenous, Q2H PRN, BlaAwilda BillP, 2 mg at 04/06/2020 0118 .  norepinephrine (LEVOPHED) 16 mg in 250m14memix infusion, 0-40 mcg/min, Intravenous, Titrated, Cintron-Diaz, Edgardo, MD, Last Rate: 31.9 mL/hr at 03/19/2020 0512, 34 mcg/min at 03/20/2020 0512 .   ondansetron (ZOFRAN-ODT) disintegrating tablet 4 mg, 4 mg, Oral, Q6H PRN **OR** ondansetron (ZOFRAN) injection 4 mg, 4 mg, Intravenous, Q6H PRN, CintHerbert Pun, 4 mg at 03/18/2020 1538 .  pantoprazole (PROTONIX) injection 40 mg, 40 mg, Intravenous, QHS, CintHerbert Pun, 40 mg at 03/15/20 2153 .  phenylephrine CONCENTRATED 100mg7msodium chloride 0.9% 250mL 12mmg/mL106mnfusion, 0-400 mcg/min, Intravenous, Titrated, [place]akeney, Dana G,Dreama Saatopped at 03/18/2020 0530 .  piperacillin-tazobactam (ZOSYN) IVPB 3.375 g, 3.375 g, Intravenous, Q8H, Grubb, Dallie PilesLast Rate: 12.5 mL/hr at 03/25/2020 0708, 3.375 g at 04/08/2020 0708 .  vasopressin (PITRESSIN) 20 Units in sodium chloride 0.9 % 100 mL infusion-*FOR SHOCK*, 0-0.03 Units/min, Intravenous, Continuous, Ardean Melroy, MD, Last Rate: 9 mL/hr at 03/15/2020 0300, 0.03 Units/min at 03/27/2020 0300    ALLERGIES   Bee  venom and Other    REVIEW OF SYSTEMS     Unable to obtain due to sedation on mechanical ventilation   PHYSICAL EXAMINATION   Vital Signs: Temp:  [101.1 F (38.4 C)-101.8 F (38.8 C)] 101.5 F (38.6 C) (09/03 1000) Pulse Rate:  [87-102] 92 (09/03 0845) Resp:  [19-23] 21 (09/03 1000) BP: (102-149)/(70-89) 128/79 (09/03 1000) SpO2:  [100 %] 100 % (09/03 0845) Arterial Line BP: (84-155)/(59-78) 143/63 (09/03 1000) FiO2 (%):  [28 %] 28 % (09/03 0830)  GENERAL:age approprate  HEAD: Normocephalic, atraumatic.  EYES: Pupils equal, round, reactive to light.  No scleral icterus.  MOUTH: Moist mucosal membrane. NECK: Supple. No thyromegaly. No nodules. No JVD.  PULMONARY: mild rhonchi with MV sounds in background CARDIOVASCULAR: S1 and S2. Regular rate and rhythm. No murmurs, rubs, or gallops.  GASTROINTESTINAL: surgical abdomen with open wound MUSCULOSKELETAL: No swelling, clubbing, or edema.  NEUROLOGIC: GCS3T SKIN:intact,warm,dry   PERTINENT DATA     Infusions: . sodium chloride 150 mL/hr at 03/18/2020  0352  . acetaminophen    . albumin human 12.5 g (04/10/2020 0946)  . fentaNYL infusion INTRAVENOUS 275 mcg/hr (03/25/2020 0735)  . norepinephrine (LEVOPHED) Adult infusion 34 mcg/min (03/30/2020 0512)  . phenylephrine (NEO-SYNEPHRINE) Adult infusion Stopped (03/29/2020 0530)  . piperacillin-tazobactam (ZOSYN)  IV 3.375 g (04/09/2020 0708)  . vasopressin 0.03 Units/min (03/19/2020 0300)   Scheduled Medications: . chlorhexidine gluconate (MEDLINE KIT)  15 mL Mouth Rinse BID  . Chlorhexidine Gluconate Cloth  6 each Topical Daily  . heparin injection (subcutaneous)  5,000 Units Subcutaneous Q8H  . hydrocortisone sod succinate (SOLU-CORTEF) inj  100 mg Intravenous Q12H  . insulin aspart  0-9 Units Subcutaneous Q4H  . insulin detemir  10 Units Subcutaneous BID  . mouth rinse  15 mL Mouth Rinse 10 times per day  . metoprolol tartrate  12.5 mg Oral Daily  . pantoprazole (PROTONIX) IV  40 mg Intravenous QHS   PRN Medications: acetaminophen, acetaminophen **OR** acetaminophen, HYDROcodone-acetaminophen, midazolam, ondansetron **OR** ondansetron (ZOFRAN) IV Hemodynamic parameters:   Intake/Output: 09/02 0701 - 09/03 0700 In: 5353.7 [I.V.:5215; IV Piggyback:138.7] Out: 2415 [Urine:2275; Drains:140]  Ventilator  Settings: Vent Mode: PRVC FiO2 (%):  [28 %] 28 % Set Rate:  [20 bmp] 20 bmp Vt Set:  [500 mL] 500 mL PEEP:  [5 cmH20] 5 cmH20 Plateau Pressure:  [23 cmH20] 23 cmH20  LAB RESULTS:  Basic Metabolic Panel: Recent Labs  Lab 03/22/2020 1226 04/11/2020 1226 03/24/2020 1347 03/15/20 0419 04/02/2020 0400  NA 139  --   --  142 145  K 3.8   < >  --  4.7 5.0  CL 101  --   --  105 113*  CO2 21*  --   --  23 23  GLUCOSE 300*  --   --  313* 135*  BUN 37*  --   --  36* 34*  CREATININE 3.27*  --  3.50* 2.84* 2.16*  CALCIUM 8.8*  --   --  7.1* 6.6*  MG  --   --   --  2.0 2.0  PHOS  --   --   --  5.9* 3.9   < > = values in this interval not displayed.   Liver Function Tests: Recent Labs  Lab  03/25/2020 1226 03/15/20 0419 04/07/2020 0400  AST 27  --   --   ALT 12  --   --   ALKPHOS 57  --   --   BILITOT 1.1  --   --  PROT 6.2*  --   --   ALBUMIN 3.0* 2.1* 2.1*   Recent Labs  Lab 03/17/2020 1226  LIPASE 35   No results for input(s): AMMONIA in the last 168 hours. CBC: Recent Labs  Lab 04/11/2020 1226 03/15/20 0419 03/25/2020 0400  WBC 5.8 8.2 17.1*  NEUTROABS 4.5 6.6 15.4*  HGB 14.0 11.8* 10.9*  HCT 42.9 36.5* 34.3*  MCV 89.9 91.7 93.0  PLT 283 200 156   Cardiac Enzymes: No results for input(s): CKTOTAL, CKMB, CKMBINDEX, TROPONINI in the last 168 hours. BNP: Invalid input(s): POCBNP CBG: Recent Labs  Lab 03/15/20 1659 03/15/20 1955 03/15/20 2307 04/04/2020 0321 03/23/2020 0719  GLUCAP 157* 141* 136* 133* 94       IMAGING RESULTS:  Imaging: CT ABDOMEN PELVIS WO CONTRAST  Result Date: 03/16/2020 CLINICAL DATA:  64 year old male with history of abdominal pain. Suspected aortic dissection. EXAM: CT CHEST, ABDOMEN AND PELVIS WITHOUT CONTRAST TECHNIQUE: Multidetector CT imaging of the chest, abdomen and pelvis was performed following the standard protocol without IV contrast. COMPARISON:  No priors. FINDINGS: Comment: Today's study is limited for detection and characterization of visceral and/or vascular lesions by lack of IV contrast material. CT CHEST FINDINGS Cardiovascular: Heart size is normal. There is no significant pericardial fluid, thickening or pericardial calcification. Small amount of gas is present in the right ventricle and pulmonic trunk. There is aortic atherosclerosis, as well as atherosclerosis of the great vessels of the mediastinum and the coronary arteries, including calcified atherosclerotic plaque in the left anterior descending coronary artery. No crescentic high attenuation associated with the wall of the thoracic aorta to suggest acute intramural hemorrhage. Small amount of gas also noted in the left axillary vein. Mediastinum/Nodes: No  pathologically enlarged mediastinal or hilar lymph nodes. Esophagus is unremarkable in appearance. No axillary lymphadenopathy. Lungs/Pleura: No suspicious appearing pulmonary nodules or masses are noted. No acute consolidative airspace disease. No pleural effusions. Linear areas of scarring and/or subsegmental atelectasis are noted in the dependent portions of the lungs bilaterally. Musculoskeletal: There are no aggressive appearing lytic or blastic lesions noted in the visualized portions of the skeleton. CT ABDOMEN PELVIS FINDINGS Hepatobiliary: Mild diffuse low attenuation throughout the hepatic parenchyma, indicative of hepatic steatosis. No definite discrete cystic or solid hepatic lesions are confidently identified on today's noncontrast CT examination. Unenhanced appearance of the gallbladder is normal. Pancreas: No definite pancreatic mass or peripancreatic fluid collections or inflammatory changes are noted on today's noncontrast CT examination. Spleen: Unremarkable. Adrenals/Urinary Tract: 1.7 cm low-attenuation lesion in the medial aspect of the lower pole of the left kidney, incompletely characterized on today's non-contrast CT examination, but statistically likely to represent a cyst. Right kidney and bilateral adrenal glands are normal in appearance. No hydroureteronephrosis. Urinary bladder is normal in appearance. Stomach/Bowel: Normal appearance of the stomach. Multiple prominent borderline dilated loops of small bowel in the left side of the abdomen measuring up to 3.1 cm in diameter with some subjective wall thickening in the region of the jejunum and small air-fluid levels. Moderate distension of the colon with fluid and gas. Potential pneumatosis in the region of the cecum best appreciated on axial image 82 of series 508 and coronal image 65 of series 511. Appendix appears dilated measuring up to 1.4 cm in diameter. Vascular/Lymphatic: Aortic atherosclerosis. No lymphadenopathy noted in the  abdomen or pelvis. Reproductive: Prostate gland and seminal vesicles are unremarkable in appearance. Other: Large volume of pneumoperitoneum. In addition, there are some unusual sites of extraluminal gas most notably  in the low anatomic pelvis encircling the mesorectum, and in the spaces adjacent to the seminal vesicles, some of which may potentially be within venous structures. Small volume of ascites. Musculoskeletal: There are no aggressive appearing lytic or blastic lesions noted in the visualized portions of the skeleton. IMPRESSION: 1. Large amount of pneumoperitoneum indicative of bowel perforation. The exact site of perforation is uncertain on today's examination, however, the favored candidate sites for perforation include the cecum where there is potential pneumatosis, and the rectosigmoid region where there appears to be a small amount of gas in the veins associated with the seminal vesicles and a small amount of gas encircling the mesorectum. Surgical consultation is strongly recommended. 2. Gas right ventricle and pulmonic trunk, as well as the left axillary vein. Findings are favored to be iatrogenic related to IV access. However, there may be some venous gas in the low anatomic pelvis, as discussed above. The possibility of ischemic bowel should be considered, and correlation with lactate levels is recommended. 3. Appendix is dilated measuring up to 14 mm in diameter. This is presumably secondary to reactive inflammation, however, attention at time of forthcoming exploratory laparotomy is recommended. 4. No definitive findings to suggest aortic dissection on today's noncontrast CT examination. 5. Hepatic steatosis. 6. Additional incidental findings, as above. Critical Value/emergent results were called by telephone at the time of interpretation on 04/05/2020 at 2:38 pm to provider MARK QUALE, who verbally acknowledged these results. Electronically Signed   By: Vinnie Langton M.D.   On: 04/03/2020  14:44   CT CHEST WO CONTRAST  Result Date: 04/06/2020 CLINICAL DATA:  65 year old male with history of abdominal pain. Suspected aortic dissection. EXAM: CT CHEST, ABDOMEN AND PELVIS WITHOUT CONTRAST TECHNIQUE: Multidetector CT imaging of the chest, abdomen and pelvis was performed following the standard protocol without IV contrast. COMPARISON:  No priors. FINDINGS: Comment: Today's study is limited for detection and characterization of visceral and/or vascular lesions by lack of IV contrast material. CT CHEST FINDINGS Cardiovascular: Heart size is normal. There is no significant pericardial fluid, thickening or pericardial calcification. Small amount of gas is present in the right ventricle and pulmonic trunk. There is aortic atherosclerosis, as well as atherosclerosis of the great vessels of the mediastinum and the coronary arteries, including calcified atherosclerotic plaque in the left anterior descending coronary artery. No crescentic high attenuation associated with the wall of the thoracic aorta to suggest acute intramural hemorrhage. Small amount of gas also noted in the left axillary vein. Mediastinum/Nodes: No pathologically enlarged mediastinal or hilar lymph nodes. Esophagus is unremarkable in appearance. No axillary lymphadenopathy. Lungs/Pleura: No suspicious appearing pulmonary nodules or masses are noted. No acute consolidative airspace disease. No pleural effusions. Linear areas of scarring and/or subsegmental atelectasis are noted in the dependent portions of the lungs bilaterally. Musculoskeletal: There are no aggressive appearing lytic or blastic lesions noted in the visualized portions of the skeleton. CT ABDOMEN PELVIS FINDINGS Hepatobiliary: Mild diffuse low attenuation throughout the hepatic parenchyma, indicative of hepatic steatosis. No definite discrete cystic or solid hepatic lesions are confidently identified on today's noncontrast CT examination. Unenhanced appearance of the  gallbladder is normal. Pancreas: No definite pancreatic mass or peripancreatic fluid collections or inflammatory changes are noted on today's noncontrast CT examination. Spleen: Unremarkable. Adrenals/Urinary Tract: 1.7 cm low-attenuation lesion in the medial aspect of the lower pole of the left kidney, incompletely characterized on today's non-contrast CT examination, but statistically likely to represent a cyst. Right kidney and bilateral adrenal glands are normal  in appearance. No hydroureteronephrosis. Urinary bladder is normal in appearance. Stomach/Bowel: Normal appearance of the stomach. Multiple prominent borderline dilated loops of small bowel in the left side of the abdomen measuring up to 3.1 cm in diameter with some subjective wall thickening in the region of the jejunum and small air-fluid levels. Moderate distension of the colon with fluid and gas. Potential pneumatosis in the region of the cecum best appreciated on axial image 82 of series 508 and coronal image 65 of series 511. Appendix appears dilated measuring up to 1.4 cm in diameter. Vascular/Lymphatic: Aortic atherosclerosis. No lymphadenopathy noted in the abdomen or pelvis. Reproductive: Prostate gland and seminal vesicles are unremarkable in appearance. Other: Large volume of pneumoperitoneum. In addition, there are some unusual sites of extraluminal gas most notably in the low anatomic pelvis encircling the mesorectum, and in the spaces adjacent to the seminal vesicles, some of which may potentially be within venous structures. Small volume of ascites. Musculoskeletal: There are no aggressive appearing lytic or blastic lesions noted in the visualized portions of the skeleton. IMPRESSION: 1. Large amount of pneumoperitoneum indicative of bowel perforation. The exact site of perforation is uncertain on today's examination, however, the favored candidate sites for perforation include the cecum where there is potential pneumatosis, and the  rectosigmoid region where there appears to be a small amount of gas in the veins associated with the seminal vesicles and a small amount of gas encircling the mesorectum. Surgical consultation is strongly recommended. 2. Gas right ventricle and pulmonic trunk, as well as the left axillary vein. Findings are favored to be iatrogenic related to IV access. However, there may be some venous gas in the low anatomic pelvis, as discussed above. The possibility of ischemic bowel should be considered, and correlation with lactate levels is recommended. 3. Appendix is dilated measuring up to 14 mm in diameter. This is presumably secondary to reactive inflammation, however, attention at time of forthcoming exploratory laparotomy is recommended. 4. No definitive findings to suggest aortic dissection on today's noncontrast CT examination. 5. Hepatic steatosis. 6. Additional incidental findings, as above. Critical Value/emergent results were called by telephone at the time of interpretation on 03/28/2020 at 2:38 pm to provider MARK QUALE, who verbally acknowledged these results. Electronically Signed   By: Vinnie Langton M.D.   On: 03/19/2020 14:44   DG Chest Port 1 View  Result Date: 03/20/2020 CLINICAL DATA:  Central line placement EXAM: PORTABLE CHEST 1 VIEW COMPARISON:  12:52 p.m. FINDINGS: Right internal jugular central venous catheter is in place with its tip at the superior cavoatrial junction. Endotracheal tube is in place with its tip at the level of the clavicular heads, 6.8 cm above the carina. Nasogastric tube extends into the left upper quadrant the abdomen. Lung volumes are small, but are symmetric. Retrocardiac opacification likely relates to atelectasis within this region. No pneumothorax or pleural effusion. Cardiac size within normal limits. Pulmonary vascularity normal. IMPRESSION: 1. Right internal jugular central venous catheter tip at the superior cavoatrial junction. No pneumothorax. 2. Endotracheal tube  and nasogastric tube in good position. 3. Retrocardiac opacification, likely atelectasis. Electronically Signed   By: Fidela Salisbury MD   On: 04/08/2020 20:31   DG Chest Port 1 View  Result Date: 03/18/2020 CLINICAL DATA:  Questionable sepsis, abdominal pain, evaluate for free air. EXAM: PORTABLE CHEST 1 VIEW COMPARISON:  No pertinent prior exams are available for comparison. FINDINGS: Heart size within normal limits. Aortic atherosclerosis. There is free intraperitoneal air beneath the diaphragm bilaterally.  Elevation of the hemidiaphragms bilaterally with minimal bibasilar atelectasis. The lungs are otherwise clear. No evidence of pleural effusion or pneumothorax. No acute bony abnormality identified. These results were called by telephone at the time of interpretation on 04/01/2020 at 1:15 pm to provider MARK QUALE , who verbally acknowledged these results. IMPRESSION: Intraperitoneal free air beneath both hemidiaphragms concerning for perforated viscus. Elevation of the hemidiaphragms bilaterally with minimal bibasilar atelectasis. The lungs are otherwise clear. Aortic Atherosclerosis (ICD10-I70.0). Electronically Signed   By: Kellie Simmering DO   On: 04/05/2020 13:17   _0 @ No results found.   ASSESSMENT AND PLAN    -Multidisciplinary rounds held today  Septic shock    - present on admission    - due to intra-abdominal infection - s/p surgery - aspiration of >700cc purulent fluid - sent to lab for micro   -ABG, trend lactate   - CVP trend if IJ-TLC   -empiric abx-zosyn   -vasopressor support   - LR to euvolemia    -amp albumin 25% daily     -solucortef 100bid   - DIC workup   -s/p IVF 6L , continue LR for now patient only on 40% and TTE is pending.   Acute bowel perforation with pneumoperitoneum -discussed with surgeon - appreciate input - Dr Peyton Najjar  - s/p surgery -POD-0 - follow surgical recs   - open abdomen  -ICU monitoring   Severe hyperglycemia   Insulin regimen  adjusted - glucose within reference range.   Acute Renal Failure stage 3-most likely due to septic nephropathy -follow chem 7 -follow UO -continue Foley Catheter-assess need daily   Moderate persistent asthma  - albuterol neb q6h   -abg .=7.26/43/119/19 on 60%  ID -continue IV abx as prescibed -follow up cultures  GI/Nutrition GI PROPHYLAXIS as indicated DIET-->TF's as tolerated Constipation protocol as indicated  ENDO - ICU hypoglycemic\Hyperglycemia protocol -check FSBS per protocol   ELECTROLYTES -follow labs as needed -replace as needed -pharmacy consultation   DVT/GI PRX ordered -SCDs  TRANSFUSIONS AS NEEDED MONITOR FSBS ASSESS the need for LABS as needed   Critical care provider statement:    Critical care time (minutes):  33   Critical care time was exclusive of:  Separately billable procedures and treating other patients   Critical care was necessary to treat or prevent imminent or life-threatening deterioration of the following conditions:  perforated bowel, pneumoperitoneum, septic shock, AKI.   Critical care was time spent personally by me on the following activities:  Development of treatment plan with patient or surrogate, discussions with consultants, evaluation of patient's response to treatment, examination of patient, obtaining history from patient or surrogate, ordering and performing treatments and interventions, ordering and review of laboratory studies and re-evaluation of patient's condition.  I assumed direction of critical care for this patient from another provider in my specialty: no    This document was prepared using Dragon voice recognition software and may include unintentional dictation errors.    Ottie Glazier, M.D.  Division of [place]

## 2020-03-16 NOTE — Anesthesia Preprocedure Evaluation (Signed)
Anesthesia Evaluation  Patient identified by MRN, date of birth, ID bandGeneral Assessment Comment:Intubated and sedated, hx obtained from pt's wife  Reviewed: Allergy & Precautions, NPO status , Patient's Chart, lab work & pertinent test results, Unable to perform ROS - Chart review only  History of Anesthesia Complications Negative for: history of anesthetic complications  Airway Mallampati: Intubated       Dental  (+) Poor Dentition   Pulmonary asthma , former smoker,    breath sounds clear to auscultation- rhonchi (-) wheezing      Cardiovascular hypertension, Pt. on medications (-) CAD, (-) Past MI, (-) Cardiac Stents and (-) CABG  Rhythm:Regular Rate:Normal - Systolic murmurs and - Diastolic murmurs    Neuro/Psych neg Seizures negative neurological ROS  negative psych ROS   GI/Hepatic negative GI ROS, Neg liver ROS,   Endo/Other  diabetes, Insulin Dependent  Renal/GU ARFRenal disease     Musculoskeletal  (+) Arthritis ,   Abdominal (+) - obese,   Peds  Hematology negative hematology ROS (+)   Anesthesia Other Findings Past Medical History: No date: Arthritis No date: Asthma     Comment:  as a child-no problems since childhood No date: Diabetes mellitus without complication (HCC) No date: Hypertension   Reproductive/Obstetrics                             Anesthesia Physical Anesthesia Plan  ASA: IV  Anesthesia Plan: General   Post-op Pain Management:    Induction: Inhalational  PONV Risk Score and Plan: 1 and Ondansetron and Treatment may vary due to age or medical condition  Airway Management Planned: Oral ETT  Additional Equipment:   Intra-op Plan:   Post-operative Plan: Post-operative intubation/ventilation  Informed Consent: I have reviewed the patients History and Physical, chart, labs and discussed the procedure including the risks, benefits and alternatives for  the proposed anesthesia with the patient or authorized representative who has indicated his/her understanding and acceptance.     Dental advisory given and Consent reviewed with POA  Plan Discussed with: CRNA and Anesthesiologist  Anesthesia Plan Comments:         Anesthesia Quick Evaluation

## 2020-03-16 NOTE — Op Note (Addendum)
Preoperative diagnosis: History of Perforated viscus                                            Intestinal obstruction                                            Open abdomen  Postoperative diagnosis: Same.   Procedure: Re opening of recent laparotomy                      Small bowel resection                      Drainage of intra abdominal abscess                      Ileostomy creation                      Negative pressure dressing placement   Anesthesia: GETA  Surgeon: Dr. Windell Moment, MD  Wound Classification: Clean Contaminated  Indications: Patient is a 64 y.o. male recent exploratory laparotomy due to perforated cecum with obstruction of the splenic flexure.  Patient had subtotal colectomy initial surgery without anastomosis or creation of ileostomy.  Patient was optimized in the intensive care unit and today count for reopening of recent laparotomy.  Findings:  1.  Severe dilation and edema of the small bowel 2.  Most distal portion of the ileum with ischemic changes 3.  Multiple interloop abscesses 4.  Adequate hemostasis  Description of procedure: The patient was placed in the supine position and general endotracheal anesthesia was induced.  Patient ready with therapeutic antibiotics and foley catheter and nasogastric tube in placed. The abdomen was prepped and draped in the usual sterile fashion. A time-out was completed verifying correct patient, procedure, site, positioning, and implant(s) and/or special equipment prior to beginning this procedure.  The ABThera wound VAC system was removed. The abdominal cavity was inspected for gross abnormalities. The small bowel was also inspected for the evidence of disease and was found to be normal.  Adhesions were bluntly dissected.  There were multiple interloop abscesses that were found and properly drained. The last 10 cm of the ileum was found with ischemic changes.  Small bowel resection was done with GIA.  The mesentery was  divided with LigaSure device. Hemostasis was checked. Attention was then turned to the right lower quadrant, where a disk of skin was excised at the previously marked site. This was deepened through the subcutaneous tissues and the fascia was incised. An opening in the abdominal wall sufficient to pass two fingers through was created. The ileum was passed through this opening and determined to lie comfortably without tension. The terminal ileum was tacked to the peritoneum circumferentially with interrupted sutures of 3-0 Vicryl. The fascia was closed with a running suture of PDS 0.  Midline wound was left open and negative pressure dressing was applied in layers.  The new midline wound measured 40 cm long x 2 cm wide. The ileostomy was matured with multiple interrupted sutures of 3-0 Vicryl placed full thickness through the edge of the ileum, catching a submucosal bite at the skin level and then suturing the ileum to the subcuticular level  of the skin. A nicely everted ileostomy with good protrusion was thus produced. An ostomy bag was applied.  The patient tolerated the procedure well and was taken to the postanesthesia care unit in stable condition.   Specimen: Small bowel  Complications: None  Estimated Blood Loss: 5 mL

## 2020-03-16 NOTE — Progress Notes (Addendum)
PHARMACY - PHYSICIAN COMMUNICATION CRITICAL VALUE ALERT - BLOOD CULTURE IDENTIFICATION (BCID)  Donald Stephenson. is an 64 y.o. male who presented to [facility] on 03/25/2020 with a chief complaint of abdominal pain  Assessment: bowel perforation s/p ex lap and colectomy.  Blood culture 1/2 sets with GNR (nothing detected on BCID),  OR culture pending.  Remains febrile, currently requiring vasopressors (but weaning)  Name of physician (or Provider) Contacted: Dr Lanney Gins  Current antibiotics: piperacillin/tazobactam  Changes to prescribed antibiotics recommended:  Recommendations accepted by provider - escalate to meropenem for now pending cultures.  Add antifungal  Results for orders placed or performed during the hospital encounter of 03/17/2020  Blood Culture ID Panel (Reflexed) (Collected: 03/18/2020  1:39 PM)  Result Value Ref Range   Enterococcus faecalis NOT DETECTED NOT DETECTED   Enterococcus Faecium NOT DETECTED NOT DETECTED   Listeria monocytogenes NOT DETECTED NOT DETECTED   Staphylococcus species NOT DETECTED NOT DETECTED   Staphylococcus aureus (BCID) NOT DETECTED NOT DETECTED   Staphylococcus epidermidis NOT DETECTED NOT DETECTED   Staphylococcus lugdunensis NOT DETECTED NOT DETECTED   Streptococcus species NOT DETECTED NOT DETECTED   Streptococcus agalactiae NOT DETECTED NOT DETECTED   Streptococcus pneumoniae NOT DETECTED NOT DETECTED   Streptococcus pyogenes NOT DETECTED NOT DETECTED   A.calcoaceticus-baumannii NOT DETECTED NOT DETECTED   Bacteroides fragilis NOT DETECTED NOT DETECTED   Enterobacterales NOT DETECTED NOT DETECTED   Enterobacter cloacae complex NOT DETECTED NOT DETECTED   Escherichia coli NOT DETECTED NOT DETECTED   Klebsiella aerogenes NOT DETECTED NOT DETECTED   Klebsiella oxytoca NOT DETECTED NOT DETECTED   Klebsiella pneumoniae NOT DETECTED NOT DETECTED   Proteus species NOT DETECTED NOT DETECTED   Salmonella species NOT DETECTED NOT DETECTED    Serratia marcescens NOT DETECTED NOT DETECTED   Haemophilus influenzae NOT DETECTED NOT DETECTED   Neisseria meningitidis NOT DETECTED NOT DETECTED   Pseudomonas aeruginosa NOT DETECTED NOT DETECTED   Stenotrophomonas maltophilia NOT DETECTED NOT DETECTED   Candida albicans NOT DETECTED NOT DETECTED   Candida auris NOT DETECTED NOT DETECTED   Candida glabrata NOT DETECTED NOT DETECTED   Candida krusei NOT DETECTED NOT DETECTED   Candida parapsilosis NOT DETECTED NOT DETECTED   Candida tropicalis NOT DETECTED NOT DETECTED   Cryptococcus neoformans/gattii NOT DETECTED NOT DETECTED    Doreene Eland, PharmD, BCPS.   Work Cell: [phone] 03/27/2020 1:42 PM

## 2020-03-16 NOTE — Progress Notes (Signed)
Pharmacy Antibiotic Note  Donald Stephenson. is a 64 y.o. male admitted on 03/15/2020 with intraabdominal infection secondary to perforated bowel s/p colectomy 9/1.  Pharmacy has been consulted for meropenem dosing.  Meropenem initiated, Zosyn discontinued to provide broader coverage considering clinical status and blood culture result. Blood Cx growing GNR, no organism detected on BCID  Plan: Discontinue piperacillin/tazobactam (Zosyn). Initiate meropenem 1g every 12 hours. Meropenem initiated q12h instead of q8h due to impaired renal function (CrCl 25-<50 mg/dL)  Anidulafungin initiated per discussion with MD  Height: 5\' 8"  (172.7 cm) Weight: 86.6 kg (191 lb) IBW/kg (Calculated) : 68.4  Temp (24hrs), Avg:101.3 F (38.5 C), Min:101.1 F (38.4 C), Max:101.8 F (38.8 C)  Recent Labs  Lab 03/25/2020 1226 04/02/2020 1226 04/03/2020 1347 03/20/2020 1821 03/31/2020 2104 03/15/20 0044 03/15/20 0419 03/15/20 2009 03/31/2020 0400  WBC 5.8  --   --   --   --   --  8.2  --  17.1*  CREATININE 3.27*  --  3.50*  --   --   --  2.84*  --  2.16*  LATICACIDVEN 6.9*   < >  --  4.4* 6.2* 4.0*  --  2.7* 2.1*   < > = values in this interval not displayed.    Estimated Creatinine Clearance: 36 mL/min (A) (by C-G formula based on SCr of 2.16 mg/dL (H)).    Allergies  Allergen Reactions  . Bee Venom Swelling  . Other Hives    ants    Antimicrobials this admission: Zosyn 3.375g q8h  03/15/20>>03/31/2020 Anidulafungin 200mg  q24h  03/14/2020>>  Meropenem 1g q12h 03/27/2020>>  Microbiology results: 9/1 BCID: No organism detected 9/1 Blood Cx: GNR in 1/2 sets 9/1 Peritoneal Cx: pending, GNR  Thank you for allowing pharmacy to be a part of this patient's care.  Benna Dunks 04/07/2020 1:29 PM

## 2020-03-16 NOTE — Care Plan (Signed)
  March 16, 2020  Patient: Donald Stephenson.  Date of Birth: [birth year]  Date of Visit: 03/24/2020    To Whom It May Concern:  [name] was seen and is being treated at [facility].   It is unclear at this time how long his hospitalization will last. Please accommodate family with excuse from work during this difficult time.   Sincerely,      Ottie Glazier, M.D.  [facility] [place]

## 2020-03-16 NOTE — Progress Notes (Signed)
[place] Hospital Day(s): 2.   Post op day(s): 2 Days Post-Op.   Interval History: Patient seen and examined, continue critically ill on mechanical ventilation.  As per discussion with nurse patient with mild improvement of blood pressures able to wean 1 vasopressor.  Still doing persistent fevers.  Vital signs in last 24 hours: [min-max] current  Temp:  [101.1 F (38.4 C)-101.8 F (38.8 C)] 101.3 F (38.5 C) (09/03 0245) Pulse Rate:  [87-102] 88 (09/03 0245) Resp:  [19-23] 21 (09/03 0245) BP: (102-149)/(70-89) 138/87 (09/03 0245) SpO2:  [99 %-100 %] 100 % (09/03 0245) Arterial Line BP: (84-155)/(55-78) 153/66 (09/03 0245) FiO2 (%):  [28 %-35 %] 28 % (09/03 0714)     Height: 5\' 8"  (172.7 cm) Weight: 86.6 kg BMI (Calculated): 29.05   Physical Exam:  Constitutional: Sedated on mechanical ventilation Respiratory: Mechanical ventilation Cardiovascular: regular rate and sinus rhythm  Gastrointestinal: Open abdomen with negative pressure dressing  Labs:  CBC Latest Ref Rng & Units 03/29/2020 03/15/2020 03/29/2020  WBC 4.0 - 10.5 K/uL 17.1(H) 8.2 5.8  Hemoglobin 13.0 - 17.0 g/dL 10.9(L) 11.8(L) 14.0  Hematocrit 39 - 52 % 34.3(L) 36.5(L) 42.9  Platelets 150 - 400 K/uL 156 200 283   CMP Latest Ref Rng & Units 03/29/2020 03/15/2020 03/25/2020  Glucose 70 - 99 mg/dL 135(H) 313(H) -  BUN 8 - 23 mg/dL 34(H) 36(H) -  Creatinine 0.61 - 1.24 mg/dL 2.16(H) 2.84(H) 3.50(H)  Sodium 135 - 145 mmol/L 145 142 -  Potassium 3.5 - 5.1 mmol/L 5.0 4.7 -  Chloride 98 - 111 mmol/L 113(H) 105 -  CO2 22 - 32 mmol/L 23 23 -  Calcium 8.9 - 10.3 mg/dL 6.6(L) 7.1(L) -  Total Protein 6.5 - 8.1 g/dL - - -  Total Bilirubin 0.3 - 1.2 mg/dL - - -  Alkaline Phos 38 - 126 U/L - - -  AST 15 - 41 U/L - - -  ALT 0 - 44 U/L - - -    Imaging studies: No new pertinent imaging studies   Assessment/Plan:  64 y.o. 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 2 Day Post-Op s/p subtotal  colectomy, complicated by pertinent comorbidities including septic shock, acute kidney injury, diabetes, hypertension.  Patient continue critically ill.  Today with persistent fevers.  There has been some improvement in the renal function unable to wean one of the vasopressors medications.  Even with this improvement patient continued doing fevers.  I think that is reasonable to proceed with emergent reopening of recent laparotomy for evaluation of possible accumulation of abscess or ischemic tissue.  Also possibly doing ileostomy since patient has a close small and large bowel.  All of this was discussed with the wife Airrion Otting, who is in agreement to proceed with surgery today.  Case discussed with critical care physician.  Arnold Long, MD

## 2020-03-17 LAB — RENAL FUNCTION PANEL
Albumin: 2 g/dL — ABNORMAL LOW (ref 3.5–5.0)
Anion gap: 6 (ref 5–15)
BUN: 37 mg/dL — ABNORMAL HIGH (ref 8–23)
CO2: 23 mmol/L (ref 22–32)
Calcium: 6.5 mg/dL — ABNORMAL LOW (ref 8.9–10.3)
Chloride: 116 mmol/L — ABNORMAL HIGH (ref 98–111)
Creatinine, Ser: 1.78 mg/dL — ABNORMAL HIGH (ref 0.61–1.24)
GFR calc Af Amer: 45 mL/min — ABNORMAL LOW (ref >60)
GFR calc non Af Amer: 39 mL/min — ABNORMAL LOW (ref >60)
Glucose, Bld: 131 mg/dL — ABNORMAL HIGH (ref 70–99)
Phosphorus: 3.4 mg/dL (ref 2.5–4.6)
Potassium: 4.9 mmol/L (ref 3.5–5.1)
Sodium: 145 mmol/L (ref 135–145)

## 2020-03-17 LAB — CBC WITH DIFFERENTIAL/PLATELET
Abs Immature Granulocytes: 0.25 10*3/uL — ABNORMAL HIGH (ref 0.00–0.07)
Basophils Absolute: 0.1 10*3/uL (ref 0.0–0.1)
Basophils Relative: 1 %
Eosinophils Absolute: 0 10*3/uL (ref 0.0–0.5)
Eosinophils Relative: 0 %
HCT: 32 % — ABNORMAL LOW (ref 39.0–52.0)
Hemoglobin: 10.5 g/dL — ABNORMAL LOW (ref 13.0–17.0)
Immature Granulocytes: 2 %
Lymphocytes Relative: 8 %
Lymphs Abs: 1 10*3/uL (ref 0.7–4.0)
MCH: 29.8 pg (ref 26.0–34.0)
MCHC: 32.8 g/dL (ref 30.0–36.0)
MCV: 90.9 fL (ref 80.0–100.0)
Monocytes Absolute: 0.8 10*3/uL (ref 0.1–1.0)
Monocytes Relative: 6 %
Neutro Abs: 11 10*3/uL — ABNORMAL HIGH (ref 1.7–7.7)
Neutrophils Relative %: 83 %
Platelets: 123 10*3/uL — ABNORMAL LOW (ref 150–400)
RBC: 3.52 MIL/uL — ABNORMAL LOW (ref 4.22–5.81)
RDW: 14.1 % (ref 11.5–15.5)
Smear Review: NORMAL
WBC: 13.2 10*3/uL — ABNORMAL HIGH (ref 4.0–10.5)
nRBC: 0.2 % (ref 0.0–0.2)

## 2020-03-17 LAB — MAGNESIUM: Magnesium: 2.2 mg/dL (ref 1.7–2.4)

## 2020-03-17 LAB — LACTIC ACID, PLASMA: Lactic Acid, Venous: 1.8 mmol/L (ref 0.5–1.9)

## 2020-03-17 LAB — GLUCOSE, CAPILLARY
Glucose-Capillary: 104 mg/dL — ABNORMAL HIGH (ref 70–99)
Glucose-Capillary: 108 mg/dL — ABNORMAL HIGH (ref 70–99)
Glucose-Capillary: 132 mg/dL — ABNORMAL HIGH (ref 70–99)
Glucose-Capillary: 143 mg/dL — ABNORMAL HIGH (ref 70–99)
Glucose-Capillary: 71 mg/dL (ref 70–99)
Glucose-Capillary: 73 mg/dL (ref 70–99)

## 2020-03-17 NOTE — Progress Notes (Addendum)
CRITICAL CARE PROGRESS NOTE    Name: Donald Stephenson. MRN: [id number] DOB: [birth year]     LOS: 3   SUBJECTIVE FINDINGS & SIGNIFICANT EVENTS    Patient description:  64yo with PMH as below came in for abdominal pain and lethargy, found to be in shock and had CXR with air under the diaphragm. CT abd with bowel perforation , patient taken to OR emergently found to have 700cc mucopurulent material s/p surgery with resection and post operative septic shock. Patient brought to MICU on MV with open abdomen for medical management.   03/15/20- patient remains critically ill on MV, on elevated settings with vasopressor support. Spoke to wife and daughter at bedside today. Severe hyperglycemia will add on levemir maintanance. Renal function improved slightly. Reviwed short term care plan with Dr Peyton Najjar.  04/05/2020- Patient scheduled for OR today. Reviewed short term care plan with wife. Letter for out of work excuse provided. Discussed case with Anesthesiologist this am.   Patient febrile this am. Leukocystosis trending up partially due to demargination from solucortef therapy, weaned off neosynephrine overnight. Flud balance 8L positive. DIC excluded. Hepatic panel in process if normal will deliver Ofirmev. 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 I have also reached out to family and reviewed most recent findings with daughter [name].  Today plan is to wean down norepi and continue medical therapy for septic shock. Will place consult for onc eval to establish care plan for future should he survive this critical illness.   Lines/tubes : Airway 7.5 mm (Active)  Secured at (cm) 21 cm 03/20/2020 0000     Arterial Line 04/05/2020 Left Radial (Active)     Negative Pressure Wound  Therapy Abdomen Mid (Active)     Urethral Catheter lisa godwin rn Double-lumen;Straight-tip 16 Fr. (Active)    Microbiology/Sepsis markers: Results for orders placed or performed during the hospital encounter of 04/04/2020  SARS Coronavirus 2 by RT PCR (hospital order, performed in [facility] hospital lab) Nasopharyngeal Nasopharyngeal Swab     Status: None   Collection Time: 03/23/2020 12:26 PM   Specimen: Nasopharyngeal Swab  Result Value Ref Range Status   SARS Coronavirus 2 NEGATIVE NEGATIVE Final    Comment: (NOTE) SARS-CoV-2 target nucleic acids are NOT DETECTED.  The SARS-CoV-2 RNA is generally detectable in upper and lower respiratory specimens during the acute phase of infection. The lowest concentration of SARS-CoV-2 viral copies this assay can detect is 250 copies / mL. A negative result does not preclude SARS-CoV-2 infection and should not be used as the sole basis for treatment or other patient management decisions.  A negative result may occur with improper specimen collection / handling, submission of specimen other than nasopharyngeal swab, presence of viral mutation(s) within the areas targeted by this assay, and inadequate number of viral copies (<250 copies / mL). A negative result must be combined with clinical observations, patient history, and epidemiological information.  Fact Sheet for Patients:   StrictlyIdeas.no  Fact Sheet for Healthcare Providers: BankingDealers.co.za  This test is not yet approved or  cleared by the [place] FDA and has been authorized for detection and/or diagnosis of SARS-CoV-2 by FDA under an Emergency Use Authorization (EUA).  This EUA will remain in effect (meaning this test can be used) for the duration of the COVID-19 declaration under Section 564(b)(1) of the Act, 21 U.S.C. section 360bbb-3(b)(1), unless the authorization is terminated or revoked sooner.  Performed at  [facility],  [place], [place] [address]   Blood culture (routine single)     Status: None (Preliminary result)   Collection Time: 03/15/2020  1:39 PM   Specimen: BLOOD  Result Value Ref Range Status   Specimen Description BLOOD BLOOD RIGHT FOREARM  Final   Special Requests   Final    BOTTLES DRAWN AEROBIC AND ANAEROBIC Blood Culture results may not be optimal due to an excessive volume of blood received in culture bottles   Culture  Setup Time   Final    Organism ID to follow GRAM NEGATIVE RODS ANAEROBIC BOTTLE ONLY CRITICAL RESULT CALLED TO, READ BACK BY AND VERIFIED WITH: DUSTIN ZEIGLER AT 6213 ON 03/31/2020 SNG Performed at [facility], [address] [place], [place] [address]    Culture GRAM NEGATIVE RODS  Final   Report Status PENDING  Incomplete  Blood Culture ID Panel (Reflexed)     Status: None   Collection Time: 04/06/2020  1:39 PM  Result Value Ref Range Status   Enterococcus faecalis NOT DETECTED NOT DETECTED Final   Enterococcus Faecium NOT DETECTED NOT DETECTED Final   Listeria monocytogenes NOT DETECTED NOT DETECTED Final   Staphylococcus species NOT DETECTED NOT DETECTED Final   Staphylococcus aureus (BCID) NOT DETECTED NOT DETECTED Final   Staphylococcus epidermidis NOT DETECTED NOT DETECTED Final   Staphylococcus lugdunensis NOT DETECTED NOT DETECTED Final   Streptococcus species NOT DETECTED NOT DETECTED Final   Streptococcus agalactiae NOT DETECTED NOT DETECTED Final   Streptococcus pneumoniae NOT DETECTED NOT DETECTED Final   Streptococcus pyogenes NOT DETECTED NOT DETECTED Final   A.calcoaceticus-baumannii NOT DETECTED NOT DETECTED Final   Bacteroides fragilis NOT DETECTED NOT DETECTED Final   Enterobacterales NOT DETECTED NOT DETECTED Final   Enterobacter cloacae complex NOT DETECTED NOT DETECTED Final   Escherichia coli NOT DETECTED NOT DETECTED Final   Klebsiella aerogenes NOT DETECTED NOT DETECTED Final   Klebsiella  oxytoca NOT DETECTED NOT DETECTED Final   Klebsiella pneumoniae NOT DETECTED NOT DETECTED Final   Proteus species NOT DETECTED NOT DETECTED Final   Salmonella species NOT DETECTED NOT DETECTED Final   Serratia marcescens NOT DETECTED NOT DETECTED Final   Haemophilus influenzae NOT DETECTED NOT DETECTED Final   Neisseria meningitidis NOT DETECTED NOT DETECTED Final   Pseudomonas aeruginosa NOT DETECTED NOT DETECTED Final   Stenotrophomonas maltophilia NOT DETECTED NOT DETECTED Final   Candida albicans NOT DETECTED NOT DETECTED Final   Candida auris NOT DETECTED NOT DETECTED Final   Candida glabrata NOT DETECTED NOT DETECTED Final   Candida krusei NOT DETECTED NOT DETECTED Final   Candida parapsilosis NOT DETECTED NOT DETECTED Final   Candida tropicalis NOT DETECTED NOT DETECTED Final   Cryptococcus neoformans/gattii NOT DETECTED NOT DETECTED Final    Comment: Performed at [facility], [address] [place], [place] [address]  Aerobic/Anaerobic Culture (surgical/deep wound)     Status: None (Preliminary result)   Collection Time: 03/24/2020  3:18 PM   Specimen: PATH Other; Body Fluid  Result Value Ref Range Status   Specimen Description   Final    PERITONEAL Performed at [facility], [address], [place],  [address]    Special Requests   Final    PERIOTTONEAL CAVITY FLUID Performed at [facility], [address] [place], [place] [address]    Gram Stain   Final    MODERATE WBC PRESENT, PREDOMINANTLY PMN ABUNDANT GRAM NEGATIVE RODS FEW GRAM POSITIVE COCCI RARE GRAM POSITIVE RODS  Culture   Final    FEW GRAM NEGATIVE RODS CULTURE REINCUBATED FOR BETTER GROWTH Performed at [facility], [place] [address] [place], [place] [address]    Report Status PENDING  Incomplete  MRSA PCR Screening     Status: None   Collection Time: 04/06/2020  6:21 PM   Specimen: Nasopharyngeal  Result Value Ref Range Status   MRSA by PCR NEGATIVE NEGATIVE  Final    Comment:        The GeneXpert MRSA Assay (FDA approved for NASAL specimens only), is one component of a comprehensive MRSA colonization surveillance program. It is not intended to diagnose MRSA infection nor to guide or monitor treatment for MRSA infections. Performed at [facility], [address] [place], [place] [address]     Anti-infectives:  Anti-infectives (From admission, onward)   Start     Dose/Rate Route Frequency Ordered Stop   03/17/20 1400  anidulafungin (ERAXIS) 100 mg in sodium chloride 0.9 % 100 mL IVPB        100 mg 78 mL/hr over 100 Minutes Intravenous Every 24 hours 03/23/2020 1322     03/28/2020 2200  meropenem (MERREM) 1 g in sodium chloride 0.9 % 100 mL IVPB        1 g 200 mL/hr over 30 Minutes Intravenous Every 12 hours 04/05/2020 1424     04/01/2020 1600  anidulafungin (ERAXIS) 200 mg in sodium chloride 0.9 % 200 mL IVPB        200 mg 78 mL/hr over 200 Minutes Intravenous  Once 03/20/2020 1322 04/08/2020 1926   03/15/20 1400  piperacillin-tazobactam (ZOSYN) IVPB 3.375 g        3.375 g 12.5 mL/hr over 240 Minutes Intravenous Every 8 hours 03/15/20 0845 03/27/2020 1927   03/17/2020 2230  piperacillin-tazobactam (ZOSYN) IVPB 3.375 g  Status:  Discontinued        3.375 g 12.5 mL/hr over 240 Minutes Intravenous Every 8 hours 04/12/2020 1428 03/18/2020 1822   04/05/2020 2200  piperacillin-tazobactam (ZOSYN) IVPB 3.375 g  Status:  Discontinued        3.375 g 12.5 mL/hr over 240 Minutes Intravenous Every 12 hours 04/02/2020 1822 03/15/20 0845   04/08/2020 1330  piperacillin-tazobactam (ZOSYN) IVPB 3.375 g        3.375 g 100 mL/hr over 30 Minutes Intravenous STAT 04/08/2020 1328 03/31/2020 1450        PAST MEDICAL HISTORY   Past Medical History:  Diagnosis Date   Arthritis    Asthma    as a child-no problems since childhood   Diabetes mellitus without complication ([place])    Hypertension      SURGICAL HISTORY   Past Surgical History:  Procedure  Laterality Date   BOWEL RESECTION N/A 04/03/2020   Procedure: SMALL BOWEL RESECTION;  Surgeon: Herbert Pun, MD;  Location: [facility] ORS;  Service: General;  Laterality: N/A;   ILEOSTOMY N/A 04/02/2020   Procedure: ILEOSTOMY CREATION;  Surgeon: Herbert Pun, MD;  Location: [facility] ORS;  Service: General;  Laterality: N/A;   LAPAROTOMY N/A 04/02/2020   Procedure: EXPLORATORY LAPAROTOMY SUBTOTAL COLECTOMY NEGATIVE PRESSURE DRESSING PLACEMENT ;  Surgeon: Herbert Pun, MD;  Location: [facility] ORS;  Service: General;  Laterality: N/A;   NO PAST SURGERIES     TOTAL HIP ARTHROPLASTY Left 04/19/2019   Procedure: TOTAL HIP ARTHROPLASTY ANTERIOR APPROACH;  Surgeon: Hessie Knows, MD;  Location: [facility] ORS;  Service: Orthopedics;  Laterality: Left;   WOUND DEBRIDEMENT N/A 04/12/2020   Procedure: DEBRIDEMENT CLOSURE/ABDOMINAL  WOUND;  Surgeon: Herbert Pun, MD;  Location: [facility] ORS;  Service: General;  Laterality: N/A;     FAMILY HISTORY   History reviewed. No pertinent family history.   SOCIAL HISTORY   Social History   Tobacco Use   Smoking status: Former Smoker    Years: 2.00    Types: Cigarettes    Quit date: 04/10/1969    Years since quitting: 50.9   Smokeless tobacco: Never Used  Vaping Use   Vaping Use: Never used  Substance Use Topics   Alcohol use: Yes    Comment: 2-3 shots and 1 beer daily   Drug use: Yes    Frequency: 5.0 times per week    Types: Marijuana     MEDICATIONS   Current Medication:  Current Facility-Administered Medications:    acetaminophen (TYLENOL) tablet 650 mg, 650 mg, Oral, Q6H PRN **OR** acetaminophen (TYLENOL) suppository 650 mg, 650 mg, Rectal, Q6H PRN, Herbert Pun, MD, 650 mg at 03/17/20 0310   albumin human 25 % solution 12.5 g, 12.5 g, Intravenous, Daily, Boysie Bonebrake, MD, Last Rate: 60 mL/hr at 03/17/20 0858, 12.5 g at 03/17/20 0858   anidulafungin (ERAXIS) 100 mg in sodium chloride 0.9 % 100 mL IVPB, 100  mg, Intravenous, Q24H, Abegail Kloeppel, MD   chlorhexidine gluconate (MEDLINE KIT) (PERIDEX) 0.12 % solution 15 mL, 15 mL, Mouth Rinse, BID, Cyris Maalouf, MD, 15 mL at 03/17/20 0726   Chlorhexidine Gluconate Cloth 2 % PADS 6 each, 6 each, Topical, Daily, Awilda Bill, NP, 6 each at 03/19/2020 1401   dextrose 5 % in lactated ringers infusion, , Intravenous, Continuous, Tukov-Yual, Magdalene S, NP, Last Rate: 40 mL/hr at 03/17/20 0700, Rate Verify at 03/17/20 0700   fentaNYL 2567mg in NS 2565m(1070mml) infusion-PREMIX, 0-400 mcg/hr, Intravenous, Continuous, Marjo Grosvenor, MD, Last Rate: 40 mL/hr at 03/17/20 1039, 400 mcg/hr at 03/17/20 1039   heparin injection 5,000 Units, 5,000 Units, Subcutaneous, Q8H, Cintron-Diaz, Edgardo, MD, 5,000 Units at 03/17/20 0625885HYDROcodone-acetaminophen (NORCO/VICODIN) 5-325 MG per tablet 1-2 tablet, 1-2 tablet, Oral, Q4H PRN, CinHerbert PunD   hydrocortisone sodium succinate (SOLU-CORTEF) 100 MG injection 100 mg, 100 mg, Intravenous, Q12H, Tamme Mozingo, MD, 100 mg at 03/17/20 0629   insulin aspart (novoLOG) injection 0-9 Units, 0-9 Units, Subcutaneous, Q4H, GruDallie PilesPH, 1 Units at 04/07/2020 0343   MEDLINE mouth rinse, 15 mL, Mouth Rinse, 10 times per day, AleOttie GlazierD, 15 mL at 03/17/20 0906   meropenem (MERREM) 1 g in sodium chloride 0.9 % 100 mL IVPB, 1 g, Intravenous, Q12H, Zeigler, DusSandi MealyPH, Last Rate: 200 mL/hr at 03/17/20 1042, 1 g at 03/17/20 1042   metoprolol tartrate (LOPRESSOR) tablet 12.5 mg, 12.5 mg, Oral, Daily, Cintron-Diaz, Edgardo, MD   midazolam (VERSED) injection 2 mg, 2 mg, Intravenous, Q2H PRN, BlaAwilda BillP, 2 mg at 03/17/20 0310   norepinephrine (LEVOPHED) 16 mg in 250m80memix infusion, 0-40 mcg/min, Intravenous, Titrated, Cintron-Diaz, Edgardo, MD, Last Rate: 7.5 mL/hr at 03/17/20 1039, 8 mcg/min at 03/17/20 1039   ondansetron (ZOFRAN-ODT) disintegrating tablet 4 mg, 4 mg, Oral, Q6H  PRN **OR** ondansetron (ZOFRAN) injection 4 mg, 4 mg, Intravenous, Q6H PRN, Cintron-Diaz, Edgardo, MD, 4 mg at 03/29/2020 1538   pantoprazole (PROTONIX) injection 40 mg, 40 mg, Intravenous, QHS, Cintron-Diaz, Edgardo, MD, 40 mg at 04/08/2020 2220   phenylephrine CONCENTRATED 100mg79msodium chloride 0.9% 250mL 90mmg/mL49mnfusion, 0-400 mcg/min, Intravenous, Titrated, Blakeney, Dana G,Dreama Saatopped at 04/10/2020 0500  vasopressin (PITRESSIN) 20 Units in sodium chloride 0.9 % 100 mL infusion-*FOR SHOCK*, 0-0.03 Units/min, Intravenous, Continuous, Shalin Linders, MD, Last Rate: 9 mL/hr at 03/17/20 0737, 0.03 Units/min at 03/17/20 0737    ALLERGIES   Bee venom and Other    REVIEW OF SYSTEMS     Unable to obtain due to sedation on mechanical ventilation   PHYSICAL EXAMINATION   Vital Signs: Temp:  [99.7 F (37.6 C)-101.1 F (38.4 C)] 100 F (37.8 C) (09/04 1000) Pulse Rate:  [62-88] 70 (09/04 1000) Resp:  [18-21] 20 (09/04 1000) BP: (83-141)/(62-85) 106/72 (09/04 1000) SpO2:  [88 %-100 %] 97 % (09/04 1000) Arterial Line BP: (87-180)/(54-80) 111/61 (09/04 1000) FiO2 (%):  [28 %-100 %] 28 % (09/04 0702)  GENERAL:age approprate  HEAD: Normocephalic, atraumatic.  EYES: Pupils equal, round, reactive to light.  No scleral icterus.  MOUTH: Moist mucosal membrane. NECK: Supple. No thyromegaly. No nodules. No JVD.  PULMONARY: mild rhonchi with MV sounds in background CARDIOVASCULAR: S1 and S2. Regular rate and rhythm. No murmurs, rubs, or gallops.  GASTROINTESTINAL: surgical abdomen with open wound MUSCULOSKELETAL: No swelling, clubbing, or edema.  NEUROLOGIC: GCS3T SKIN:intact,warm,dry   PERTINENT DATA     Infusions:  albumin human 12.5 g (03/17/20 0858)   anidulafungin     dextrose 5% lactated ringers 40 mL/hr at 03/17/20 0700   fentaNYL infusion INTRAVENOUS 400 mcg/hr (03/17/20 1039)   meropenem (MERREM) IV 1 g (03/17/20 1042)   norepinephrine (LEVOPHED) Adult  infusion 8 mcg/min (03/17/20 1039)   phenylephrine (NEO-SYNEPHRINE) Adult infusion Stopped (03/29/2020 0500)   vasopressin 0.03 Units/min (03/17/20 0737)   Scheduled Medications:  chlorhexidine gluconate (MEDLINE KIT)  15 mL Mouth Rinse BID   Chlorhexidine Gluconate Cloth  6 each Topical Daily   heparin injection (subcutaneous)  5,000 Units Subcutaneous Q8H   hydrocortisone sod succinate (SOLU-CORTEF) inj  100 mg Intravenous Q12H   insulin aspart  0-9 Units Subcutaneous Q4H   mouth rinse  15 mL Mouth Rinse 10 times per day   metoprolol tartrate  12.5 mg Oral Daily   pantoprazole (PROTONIX) IV  40 mg Intravenous QHS   PRN Medications: acetaminophen **OR** acetaminophen, HYDROcodone-acetaminophen, midazolam, ondansetron **OR** ondansetron (ZOFRAN) IV Hemodynamic parameters:   Intake/Output: 09/03 0701 - 09/04 0700 In: 2704.7 [I.V.:1987.7; IV Piggyback:716.9] Out: 3020 [Urine:1800; Drains:1215; Blood:5]  Ventilator  Settings: Vent Mode: PRVC FiO2 (%):  [28 %-100 %] 28 % Set Rate:  [20 bmp] 20 bmp Vt Set:  [500 mL] 500 mL PEEP:  [5 cmH20] 5 cmH20 Plateau Pressure:  [17 cmH20-19 cmH20] 19 cmH20  LAB RESULTS:  Basic Metabolic Panel: Recent Labs  Lab 03/19/2020 1226 04/10/2020 1226 03/19/2020 1347 03/15/20 0419 03/15/20 0419 03/30/2020 0400 03/17/20 0401 03/17/20 0500  NA 139  --   --  142  --  145  --  145  K 3.8   < >  --  4.7   < > 5.0  --  4.9  CL 101  --   --  105  --  113*  --  116*  CO2 21*  --   --  23  --  23  --  23  GLUCOSE 300*  --   --  313*  --  135*  --  131*  BUN 37*  --   --  36*  --  34*  --  37*  CREATININE 3.27*  --  3.50* 2.84*  --  2.16*  --  1.78*  CALCIUM 8.8*  --   --  7.1*  --  6.6*  --  6.5*  MG  --   --   --  2.0  --  2.0 2.2  --   PHOS  --   --   --  5.9*  --  3.9  --  3.4   < > = values in this interval not displayed.   Liver Function Tests: Recent Labs  Lab 03/20/2020 1226 03/15/20 0419 03/17/2020 0400 03/17/20 0500  AST 27  --  113*   --   ALT 12  --  83*  --   ALKPHOS 57  --  61  --   BILITOT 1.1  --  0.5  --   PROT 6.2*  --  5.0*  --   ALBUMIN 3.0* 2.1* 2.0*   2.1* 2.0*   Recent Labs  Lab 04/12/2020 1226  LIPASE 35   No results for input(s): AMMONIA in the last 168 hours. CBC: Recent Labs  Lab 03/19/2020 1226 03/15/20 0419 04/07/2020 0400 03/17/20 0401  WBC 5.8 8.2 17.1* 13.2*  NEUTROABS 4.5 6.6 15.4* 11.0*  HGB 14.0 11.8* 10.9* 10.5*  HCT 42.9 36.5* 34.3* 32.0*  MCV 89.9 91.7 93.0 90.9  PLT 283 200 156 123*   Cardiac Enzymes: No results for input(s): CKTOTAL, CKMB, CKMBINDEX, TROPONINI in the last 168 hours. 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 Date of Exam: 03/27/2020 Medical Rec #:  [id number]              Height:       68.0 in Accession #:    [id number]             Weight:       191.0 lb Date of Birth:  Apr 20, 1956               BSA:          2.004 m Patient Age:    2 years               BP:           81/63 mmHg Patient Gender: M                      HR:           74 bpm. Exam Location:  [facility] Procedure: 2D Echo, Color Doppler and Cardiac Doppler Indications:     H96.22 CHF-Acute Systolic  History:         Patient has no prior history of Echocardiogram examinations.                  Risk Factors:Hypertension and Diabetes.  Sonographer:     Charmayne Sheer RDCS (AE) Referring Phys:  [id number] Ottie Glazier Diagnosing Phys: Ida Rogue MD  Sonographer Comments: Echo performed with patient supine and on artificial respirator and no subcostal window. Image acquisition challenging due to respiratory motion. IMPRESSIONS  1. Left ventricular ejection fraction, by estimation, is 35 to 40%. The left ventricle has moderately decreased function. The left  ventricle demonstrates global hypokinesis. Left ventricular diastolic parameters are consistent with Grade II diastolic dysfunction (pseudonormalization).  2. Right ventricular systolic function is mildly reduced. The right ventricular size is normal.  Tricuspid regurgitation signal is inadequate for assessing PA pressure.  3. The mitral valve is normal in structure. Mild mitral valve regurgitation. FINDINGS  Left Ventricle: Left ventricular ejection fraction, by estimation, is 35 to 40%. The left ventricle has moderately decreased function. The left ventricle demonstrates global hypokinesis. The left ventricular internal cavity size was normal in size. There is no left ventricular hypertrophy. Left ventricular diastolic parameters are consistent with Grade II diastolic dysfunction (pseudonormalization). Right Ventricle: The right ventricular size is normal. No increase in right ventricular wall thickness. Right ventricular systolic function is mildly reduced. Tricuspid regurgitation signal is inadequate for assessing PA pressure. Left Atrium: Left atrial size was normal in size. Right Atrium: Right atrial size was normal in size. Pericardium: There is no evidence of pericardial effusion. Mitral Valve: The mitral valve is normal in structure. Normal mobility of the mitral valve leaflets. Mild mitral valve regurgitation. No evidence of mitral valve stenosis. MV peak gradient, 2.0 mmHg. The mean mitral valve gradient is 1.0 mmHg. Tricuspid Valve: The tricuspid valve is normal in structure. Tricuspid valve regurgitation is mild . No evidence of tricuspid stenosis. Aortic Valve: The aortic valve is normal in structure. Aortic valve regurgitation is not visualized. No aortic stenosis is present. Aortic valve mean gradient measures 2.0 mmHg. Aortic valve peak gradient measures 3.4 mmHg. Aortic valve area, by VTI measures 3.97 cm. Pulmonic Valve: The pulmonic valve was normal in structure. Pulmonic valve regurgitation is  not visualized. No evidence of pulmonic stenosis. Aorta: The aortic root is normal in size and structure. Venous: The inferior vena cava is normal in size with greater than 50% respiratory variability, suggesting right atrial pressure of 3 mmHg. IAS/Shunts: No atrial level shunt detected by color flow Doppler.  LEFT VENTRICLE PLAX 2D LVIDd:         4.34 cm  Diastology LVIDs:         3.29 cm  LV e' lateral:   9.68 cm/s LV PW:         1.04 cm  LV E/e' lateral: 6.3 LV IVS:        0.86 cm  LV e' medial:    5.77 cm/s LVOT diam:     2.70 cm  LV E/e' medial:  10.5 LV SV:         55 LV SV Index:   27 LVOT Area:     5.73 cm  RIGHT VENTRICLE RV Basal diam:  3.26 cm LEFT ATRIUM             Index       RIGHT ATRIUM           Index LA diam:        3.90 cm 1.95 cm/m  RA Area:     11.70 cm LA Vol (A2C):   24.5 ml 12.22 ml/m RA Volume:   25.40 ml  12.67 ml/m LA Vol (A4C):   34.9 ml 17.41 ml/m LA Biplane Vol: 31.7 ml 15.82 ml/m  AORTIC VALVE                   PULMONIC VALVE AV Area (Vmax):    4.74 cm    PV Vmax:       0.65 m/s AV Area (Vmean):   5.00 cm    PV Vmean:      45.600 cm/s AV Area (VTI):     3.97 cm    PV VTI:        0.081 m AV Vmax:  92.20 cm/s  PV Peak grad:  1.7 mmHg AV Vmean:          59.400 cm/s PV Mean grad:  1.0 mmHg AV VTI:            0.138 m AV Peak Grad:      3.4 mmHg AV Mean Grad:      2.0 mmHg LVOT Vmax:         76.40 cm/s LVOT Vmean:        51.900 cm/s LVOT VTI:          0.096 m LVOT/AV VTI ratio: 0.69  AORTA Ao Root diam: 3.60 cm MITRAL VALVE MV Area (PHT): 3.43 cm    SHUNTS MV Peak grad:  2.0 mmHg    Systemic VTI:  0.10 m MV Mean grad:  1.0 mmHg    Systemic Diam: 2.70 cm MV Vmax:       0.72 m/s MV Vmean:      48.6 cm/s MV Decel Time: 221 msec MV E velocity: 60.80 cm/s MV A velocity: 60.40 cm/s MV E/A ratio:  1.01 Ida Rogue MD Electronically signed by Ida Rogue MD Signature Date/Time: 04/01/2020/4:24:38 PM    Final    '@PROBHOSP' @ ECHOCARDIOGRAM COMPLETE  Result Date: 04/06/2020     ECHOCARDIOGRAM REPORT   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 Date of Exam: 04/03/2020 Medical Rec #:  [id number]              Height:       68.0 in Accession #:    [id number]             Weight:       191.0 lb Date of Birth:  [birth year]               BSA:          2.004 m Patient Age:    1 years               BP:           81/63 mmHg Patient Gender: M                      HR:           74 bpm. Exam Location:  [facility] Procedure: 2D Echo, Color Doppler and Cardiac Doppler Indications:     L24.40 CHF-Acute Systolic  History:         Patient has no prior history of Echocardiogram examinations.                  Risk Factors:Hypertension and Diabetes.  Sonographer:     Charmayne Sheer RDCS (AE) Referring Phys:  [id number] Ottie Glazier Diagnosing Phys: Ida Rogue MD  Sonographer Comments: Echo performed with patient supine and on artificial respirator and no subcostal window. Image acquisition challenging due to respiratory motion. IMPRESSIONS  1. Left ventricular ejection fraction, by estimation, is 35 to 40%. The left ventricle has moderately decreased function. The left ventricle demonstrates global hypokinesis. Left ventricular diastolic parameters are consistent with Grade II diastolic dysfunction (pseudonormalization).  2. Right ventricular systolic function is mildly reduced. The right ventricular size is normal. Tricuspid regurgitation signal is inadequate for assessing PA pressure.  3. The mitral valve is normal in structure. Mild mitral valve regurgitation. FINDINGS  Left Ventricle: Left ventricular ejection fraction, by estimation, is 35 to 40%. The left ventricle has moderately decreased function. The left ventricle demonstrates global hypokinesis. The left ventricular internal cavity  size was normal in size. There is no left ventricular hypertrophy. Left ventricular diastolic parameters are consistent with Grade II diastolic dysfunction (pseudonormalization). Right Ventricle: The right ventricular size is normal.  No increase in right ventricular wall thickness. Right ventricular systolic function is mildly reduced. Tricuspid regurgitation signal is inadequate for assessing PA pressure. Left Atrium: Left atrial size was normal in size. Right Atrium: Right atrial size was normal in size. Pericardium: There is no evidence of pericardial effusion. Mitral Valve: The mitral valve is normal in structure. Normal mobility of the mitral valve leaflets. Mild mitral valve regurgitation. No evidence of mitral valve stenosis. MV peak gradient, 2.0 mmHg. The mean mitral valve gradient is 1.0 mmHg. Tricuspid Valve: The tricuspid valve is normal in structure. Tricuspid valve regurgitation is mild . No evidence of tricuspid stenosis. Aortic Valve: The aortic valve is normal in structure. Aortic valve regurgitation is not visualized. No aortic stenosis is present. Aortic valve mean gradient measures 2.0 mmHg. Aortic valve peak gradient measures 3.4 mmHg. Aortic valve area, by VTI measures 3.97 cm. Pulmonic Valve: The pulmonic valve was normal in structure. Pulmonic valve regurgitation is not visualized. No evidence of pulmonic stenosis. Aorta: The aortic root is normal in size and structure. Venous: The inferior vena cava is normal in size with greater than 50% respiratory variability, suggesting right atrial pressure of 3 mmHg. IAS/Shunts: No atrial level shunt detected by color flow Doppler.  LEFT VENTRICLE PLAX 2D LVIDd:         4.34 cm  Diastology LVIDs:         3.29 cm  LV e' lateral:   9.68 cm/s LV PW:         1.04 cm  LV E/e' lateral: 6.3 LV IVS:        0.86 cm  LV e' medial:    5.77 cm/s LVOT diam:     2.70 cm  LV E/e' medial:  10.5 LV SV:         55 LV SV Index:   27 LVOT Area:     5.73 cm  RIGHT VENTRICLE RV Basal diam:  3.26 cm LEFT ATRIUM             Index       RIGHT ATRIUM           Index LA diam:        3.90 cm 1.95 cm/m  RA Area:     11.70 cm LA Vol (A2C):   24.5 ml 12.22 ml/m RA Volume:   25.40 ml  12.67 ml/m LA Vol  (A4C):   34.9 ml 17.41 ml/m LA Biplane Vol: 31.7 ml 15.82 ml/m  AORTIC VALVE                   PULMONIC VALVE AV Area (Vmax):    4.74 cm    PV Vmax:       0.65 m/s AV Area (Vmean):   5.00 cm    PV Vmean:      45.600 cm/s AV Area (VTI):     3.97 cm    PV VTI:        0.081 m AV Vmax:           92.20 cm/s  PV Peak grad:  1.7 mmHg AV Vmean:          59.400 cm/s PV Mean grad:  1.0 mmHg AV VTI:            0.138 m AV Peak  Grad:      3.4 mmHg AV Mean Grad:      2.0 mmHg LVOT Vmax:         76.40 cm/s LVOT Vmean:        51.900 cm/s LVOT VTI:          0.096 m LVOT/AV VTI ratio: 0.69  AORTA Ao Root diam: 3.60 cm MITRAL VALVE MV Area (PHT): 3.43 cm    SHUNTS MV Peak grad:  2.0 mmHg    Systemic VTI:  0.10 m MV Mean grad:  1.0 mmHg    Systemic Diam: 2.70 cm MV Vmax:       0.72 m/s MV Vmean:      48.6 cm/s MV Decel Time: 221 msec MV E velocity: 60.80 cm/s MV A velocity: 60.40 cm/s MV E/A ratio:  1.01 Ida Rogue MD Electronically signed by Ida Rogue MD Signature Date/Time: 03/27/2020/4:24:38 PM    Final        ASSESSMENT AND PLAN    -Multidisciplinary rounds held today  Septic shock    - present on admission    - due to intra-abdominal infection - s/p surgery - aspiration of >700cc purulent fluid - sent to lab for micro   -ABG, trend lactate   - CVP trend if IJ-TLC   -empiric abx-zosyn   -vasopressor support   - LR to euvolemia   s/p -amp albumin 25%      -solucortef 100bid   - DIC workup   -s/p IVF 6L , continue LR for now patient only on 40% and TTE is pending.   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   Acute  Renal Failure stage 3-most likely due to septic nephropathy -follow chem 7 -follow UO -continue Foley Catheter-assess need daily   Moderate persistent asthma  - albuterol neb q6h   -abg .=7.26/43/119/19 on 60%  ID -continue IV abx as prescibed -follow up cultures  GI/Nutrition GI PROPHYLAXIS as indicated DIET-->TF's as tolerated Constipation protocol as indicated  ENDO - ICU hypoglycemic\Hyperglycemia protocol -check FSBS per protocol   ELECTROLYTES -follow labs as needed -replace as needed -pharmacy consultation   DVT/GI PRX ordered -SCDs  TRANSFUSIONS AS NEEDED MONITOR FSBS ASSESS the need for LABS as needed   Critical care provider statement:    Critical care time (minutes):  33   Critical care time was exclusive of:  Separately billable procedures and treating other patients   Critical care was necessary to treat or prevent imminent or life-threatening deterioration of the following conditions:  perforated bowel, pneumoperitoneum, septic shock, AKI.   Critical care was time spent personally by me on the following activities:  Development of treatment plan with patient or surrogate, discussions with consultants, evaluation of patient's response to treatment, examination of patient, obtaining history from patient or surrogate, ordering and performing treatments and interventions, ordering and review of laboratory studies and re-evaluation of patient's condition.  I assumed direction of critical care for this patient from another provider in my specialty: no    This document was prepared using Dragon voice recognition software and may include unintentional dictation errors.    Ottie Glazier, M.D.  Division  of Pulmonary & [facility]

## 2020-03-17 NOTE — Progress Notes (Signed)
[facility] Day(s): 3.   Interval History: Patient seen and examined, continue critically ill mechanical ventilation.  Patient continues septic shock.  Patient had reopening of recent laparotomy yesterday with intra-abdominal abscess drainage, ileostomy creation and closure of the abdominal wall with negative pressure dressing placement.  Vital signs in last 24 hours: [min-max] current  Temp:  [99.7 F (37.6 C)-101.5 F (38.6 C)] 100 F (37.8 C) (09/04 0900) Pulse Rate:  [62-88] 70 (09/04 0900) Resp:  [18-21] 20 (09/04 0900) BP: (83-141)/(62-85) 106/76 (09/04 0900) SpO2:  [88 %-100 %] 94 % (09/04 0900) Arterial Line BP: (87-180)/(54-80) 107/59 (09/04 0900) FiO2 (%):  [28 %-100 %] 28 % (09/04 0702)     Height: 5\' 8"  (172.7 cm) Weight: 86.6 kg BMI (Calculated): 29.05   Physical Exam:  Constitutional: Sedated on mechanical ventilation Respiratory: On mechanical ventilation tolerating current parameters Cardiovascular: regular rate and sinus rhythm  Gastrointestinal: Soft, ileostomy patent, negative pressure dressing on midline.  Labs:  CBC Latest Ref Rng & Units 03/17/2020 03/15/2020 03/15/2020  WBC 4.0 - 10.5 K/uL 13.2(H) 17.1(H) 8.2  Hemoglobin 13.0 - 17.0 g/dL 10.5(L) 10.9(L) 11.8(L)  Hematocrit 39 - 52 % 32.0(L) 34.3(L) 36.5(L)  Platelets 150 - 400 K/uL 123(L) 156 200   CMP Latest Ref Rng & Units 03/17/2020 04/05/2020 03/15/2020  Glucose 70 - 99 mg/dL 131(H) 135(H) 313(H)  BUN 8 - 23 mg/dL 37(H) 34(H) 36(H)  Creatinine 0.61 - 1.24 mg/dL 1.78(H) 2.16(H) 2.84(H)  Sodium 135 - 145 mmol/L 145 145 142  Potassium 3.5 - 5.1 mmol/L 4.9 5.0 4.7  Chloride 98 - 111 mmol/L 116(H) 113(H) 105  CO2 22 - 32 mmol/L 23 23 23   Calcium 8.9 - 10.3 mg/dL 6.5(L) 6.6(L) 7.1(L)  Total Protein 6.5 - 8.1 g/dL - 5.0(L) -  Total Bilirubin 0.3 - 1.2 mg/dL - 0.5 -  Alkaline Phos 38 - 126 U/L - 61 -  AST 15 - 41 U/L - 113(H) -  ALT 0 - 44 U/L - 83(H) -    Imaging studies: No new  pertinent imaging studies   Assessment/Plan:  64 y.o.malewith descending colon obstructive mass with cecum perforation3 Day Post-Ops/p subtotal colectomy and interval reopening of recent laparotomy with drainage of intra-abdominal abscess and ileostomy creation postoperative day #1, complicated by pertinent comorbidities including, respiratory failure mechanical ventilation due to hypoxia,septic shock, acute kidney injury, diabetes, hypertension, and newly diagnosed malignant neoplasm of the splenic flexure.  Patient continue critically ill mechanical ventilation.  Patient continue with septic shock.  Patient today with 2 vasopressors and slowly weaning Levophed. Patient also with slowly improving kidney function with better urine output.   I recommend to continue NPO due to the severity of the bowel distention consistent with ileus that is expected after this big surgery and patient critical status. TPN can be consider.   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 Will order CEA even though is 3 days post surgery to have it as baseline. Will hold CT scan of chest abdomen and pelvis with contrast until patient recover from the acute kidney injury.   Appreciate ICU physician for the management of critical care such as septic shock and respiratory failure on mechanical ventilation. Will appreciate recommendations regarding the continuation of beta blockers during this phase of septic shock.   The wife [name] and the daughter [name] were updated of patient critical condition and the new diagnosis of colon cancer.    Arnold Long,  MD

## 2020-03-17 NOTE — Anesthesia Postprocedure Evaluation (Signed)
Anesthesia Post Note  Patient: Donald Stephenson.  Procedure(s) Performed: ILEOSTOMY CREATION (N/A ) DEBRIDEMENT CLOSURE/ABDOMINAL WOUND (N/A ) SMALL BOWEL RESECTION (N/A )  Patient location during evaluation: ICU Anesthesia Type: General Level of consciousness: patient remains intubated per anesthesia plan Pain management: pain level controlled Respiratory status: patient on ventilator - see flowsheet for VS Anesthetic complications: no Comments: Patient remains intubated with current BPs in low 100'P   No complications documented.   Last Vitals:  Vitals:   03/17/20 0900 03/17/20 1000  BP: 106/76 106/72  Pulse: 70 70  Resp: 20 20  Temp: 37.8 C 37.8 C  SpO2: 94% 97%    Last Pain:  Vitals:   03/17/20 0500  TempSrc: Esophageal  PainSc:                  [name]

## 2020-03-18 ENCOUNTER — Inpatient Hospital Stay: Payer: BC Managed Care – PPO

## 2020-03-18 DIAGNOSIS — J9601 Acute respiratory failure with hypoxia: Secondary | ICD-10-CM

## 2020-03-18 DIAGNOSIS — A419 Sepsis, unspecified organism: Secondary | ICD-10-CM

## 2020-03-18 DIAGNOSIS — R6521 Severe sepsis with septic shock: Secondary | ICD-10-CM

## 2020-03-18 DIAGNOSIS — C186 Malignant neoplasm of descending colon: Secondary | ICD-10-CM

## 2020-03-18 DIAGNOSIS — K631 Perforation of intestine (nontraumatic): Secondary | ICD-10-CM

## 2020-03-18 LAB — RENAL FUNCTION PANEL
Albumin: 2 g/dL — ABNORMAL LOW (ref 3.5–5.0)
Anion gap: 10 (ref 5–15)
BUN: 41 mg/dL — ABNORMAL HIGH (ref 8–23)
CO2: 24 mmol/L (ref 22–32)
Calcium: 7.1 mg/dL — ABNORMAL LOW (ref 8.9–10.3)
Chloride: 115 mmol/L — ABNORMAL HIGH (ref 98–111)
Creatinine, Ser: 1.52 mg/dL — ABNORMAL HIGH (ref 0.61–1.24)
GFR calc Af Amer: 55 mL/min — ABNORMAL LOW (ref >60)
GFR calc non Af Amer: 47 mL/min — ABNORMAL LOW (ref >60)
Glucose, Bld: 169 mg/dL — ABNORMAL HIGH (ref 70–99)
Phosphorus: 2.5 mg/dL (ref 2.5–4.6)
Potassium: 4.7 mmol/L (ref 3.5–5.1)
Sodium: 149 mmol/L — ABNORMAL HIGH (ref 135–145)

## 2020-03-18 LAB — CBC WITH DIFFERENTIAL/PLATELET
Abs Immature Granulocytes: 0.1 10*3/uL — ABNORMAL HIGH (ref 0.00–0.07)
Basophils Absolute: 0 10*3/uL (ref 0.0–0.1)
Basophils Relative: 0 %
Eosinophils Absolute: 0 10*3/uL (ref 0.0–0.5)
Eosinophils Relative: 0 %
HCT: 34.1 % — ABNORMAL LOW (ref 39.0–52.0)
Hemoglobin: 10.7 g/dL — ABNORMAL LOW (ref 13.0–17.0)
Immature Granulocytes: 1 %
Lymphocytes Relative: 9 %
Lymphs Abs: 0.9 10*3/uL (ref 0.7–4.0)
MCH: 29.6 pg (ref 26.0–34.0)
MCHC: 31.4 g/dL (ref 30.0–36.0)
MCV: 94.2 fL (ref 80.0–100.0)
Monocytes Absolute: 0.7 10*3/uL (ref 0.1–1.0)
Monocytes Relative: 7 %
Neutro Abs: 8 10*3/uL — ABNORMAL HIGH (ref 1.7–7.7)
Neutrophils Relative %: 83 %
Platelets: 96 10*3/uL — ABNORMAL LOW (ref 150–400)
RBC: 3.62 MIL/uL — ABNORMAL LOW (ref 4.22–5.81)
RDW: 14.3 % (ref 11.5–15.5)
Smear Review: NORMAL
WBC: 9.7 10*3/uL (ref 4.0–10.5)
nRBC: 0.2 % (ref 0.0–0.2)

## 2020-03-18 LAB — GLUCOSE, CAPILLARY
Glucose-Capillary: 133 mg/dL — ABNORMAL HIGH (ref 70–99)
Glucose-Capillary: 145 mg/dL — ABNORMAL HIGH (ref 70–99)
Glucose-Capillary: 148 mg/dL — ABNORMAL HIGH (ref 70–99)
Glucose-Capillary: 150 mg/dL — ABNORMAL HIGH (ref 70–99)
Glucose-Capillary: 151 mg/dL — ABNORMAL HIGH (ref 70–99)
Glucose-Capillary: 152 mg/dL — ABNORMAL HIGH (ref 70–99)
Glucose-Capillary: 154 mg/dL — ABNORMAL HIGH (ref 70–99)

## 2020-03-18 LAB — CULTURE, BLOOD (SINGLE)

## 2020-03-18 LAB — AEROBIC/ANAEROBIC CULTURE W GRAM STAIN (SURGICAL/DEEP WOUND)

## 2020-03-18 LAB — MAGNESIUM: Magnesium: 2.5 mg/dL — ABNORMAL HIGH (ref 1.7–2.4)

## 2020-03-18 MED ORDER — ORAL CARE MOUTH RINSE
15.0000 mL | Freq: Two times a day (BID) | OROMUCOSAL | Status: DC
  Administered 2020-03-19 – 2020-03-25 (×12): 15 mL via OROMUCOSAL

## 2020-03-18 MED ORDER — SODIUM CHLORIDE 0.9 % IV SOLN
1.0000 g | Freq: Three times a day (TID) | INTRAVENOUS | Status: DC
  Administered 2020-03-18 – 2020-03-20 (×6): 1 g via INTRAVENOUS
  Filled 2020-03-18 (×8): qty 1

## 2020-03-18 MED ORDER — CHLORHEXIDINE GLUCONATE 0.12 % MT SOLN
15.0000 mL | Freq: Two times a day (BID) | OROMUCOSAL | Status: DC
  Administered 2020-03-19 – 2020-03-25 (×14): 15 mL via OROMUCOSAL
  Filled 2020-03-18 (×15): qty 15

## 2020-03-18 NOTE — Progress Notes (Signed)
Assisted family with tel-visit via elink

## 2020-03-18 NOTE — Consult Note (Signed)
[facility] NOTE  Patient Care Team: Donnamarie Rossetti, PA-C as PCP - General (Family Medicine)  CHIEF COMPLAINTS/PURPOSE OF CONSULTATION:  Colon cancer  HISTORY OF PRESENTING ILLNESS: Patient intubated/no family by the bedside. Donald Stephenson. 64 y.o.  male patient with no previous colonoscopy/diagnosis of colon cancer is currently admitted to hospital for abdominal pain/diagnosed with bowel perforation-needing emergency surgery.  Patient underwent cecal perforation; and also noted to have splenic flexure/descending colon cancer. Patient underwent right; left hemicolectomy. 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  Patient is currently intubated. Is expected to be extubated soon.   Review of Systems  Unable to perform ROS: Intubated     MEDICAL HISTORY:  Past Medical History:  Diagnosis Date  . Arthritis   . Asthma    as a child-no problems since childhood  . Diabetes mellitus without complication ([place])   . Hypertension     SURGICAL HISTORY: Past Surgical History:  Procedure Laterality Date  . BOWEL RESECTION N/A 03/22/2020   Procedure: SMALL BOWEL RESECTION;  Surgeon: Herbert Pun, MD;  Location: [facility] ORS;  Service: General;  Laterality: N/A;  . ILEOSTOMY N/A 03/19/2020   Procedure: ILEOSTOMY CREATION;  Surgeon: Herbert Pun, MD;  Location: [facility] ORS;  Service: General;  Laterality: N/A;  . LAPAROTOMY N/A 03/30/2020   Procedure: EXPLORATORY LAPAROTOMY SUBTOTAL COLECTOMY NEGATIVE PRESSURE DRESSING PLACEMENT ;  Surgeon: Herbert Pun, MD;  Location: [facility] ORS;  Service: General;  Laterality: N/A;  . NO PAST SURGERIES    . TOTAL HIP ARTHROPLASTY Left 04/19/2019   Procedure: TOTAL HIP ARTHROPLASTY ANTERIOR APPROACH;  Surgeon: Hessie Knows, MD;  Location: [facility] ORS;  Service: Orthopedics;  Laterality: Left;  . WOUND DEBRIDEMENT N/A 04/01/2020   Procedure:  DEBRIDEMENT CLOSURE/ABDOMINAL WOUND;  Surgeon: Herbert Pun, MD;  Location: [facility] OR[facility];  Service: General;  Laterality: N/A;    SOCIAL HISTORY: Social History   Socioeconomic History  . Marital status: Married    Spouse name: Not on file  . Number of children: Not on file  . Years of education: Not on file  . Highest education level: Not on file  Occupational History  . Not on file  Tobacco Use  . Smoking status: Former Smoker    Years: 2.00    Types: Cigarettes    Quit date: 04/10/1969    Years since quitting: 50.9  . Smokeless tobacco: Never Used  Vaping Use  . Vaping Use: Never used  Substance and Sexual Activity  . Alcohol use: Yes    Comment: 2-3 shots and 1 beer daily  . Drug use: Yes    Frequency: 5.0 times per week    Types: Marijuana  . Sexual activity: Not on file  Other Topics Concern  . Not on file  Social History Narrative  . Not on file   Social Determinants of Health   Financial Resource Strain:   . Difficulty of Paying Living Expenses: Not on file  Food Insecurity:   . Worried About Charity fundraiser in the Last Year: Not on file  . Ran Out of Food in the Last Year: Not on file  Transportation Needs:   . Lack of Transportation (Medical): Not on file  . Lack of Transportation (Non-Medical): Not on file  Physical Activity:   . Days of Exercise per Week: Not on file  . Minutes of Exercise per Session: Not on file  Stress:   . Feeling of Stress :  Not on file  Social Connections:   . Frequency of Communication with Friends and Family: Not on file  . Frequency of Social Gatherings with Friends and Family: Not on file  . Attends Religious Services: Not on file  . Active Member of Clubs or Organizations: Not on file  . Attends Archivist Meetings: Not on file  . Marital Status: Not on file  Intimate Partner Violence:   . Fear of Current or Ex-Partner: Not on file  . Emotionally Abused: Not on file  . Physically Abused: Not on file   . Sexually Abused: Not on file    FAMILY HISTORY: History reviewed. No pertinent family history.  ALLERGIES:  is allergic to bee venom and other.  MEDICATIONS:  Current Facility-Administered Medications  Medication Dose Route Frequency Provider Last Rate Last Admin  . acetaminophen (TYLENOL) tablet 650 mg  650 mg Oral Q6H PRN Herbert Pun, MD       Or  . acetaminophen (TYLENOL) suppository 650 mg  650 mg Rectal Q6H PRN Herbert Pun, MD   650 mg at 03/17/20 0310  . albumin human 25 % solution 12.5 g  12.5 g Intravenous Daily Ottie Glazier, MD   Stopped at 03/18/20 0941  . anidulafungin (ERAXIS) 100 mg in sodium chloride 0.9 % 100 mL IVPB  100 mg Intravenous Q24H Ottie Glazier, MD 78 mL/hr at 03/18/20 1324 100 mg at 03/18/20 1324  . chlorhexidine gluconate (MEDLINE KIT) (PERIDEX) 0.12 % solution 15 mL  15 mL Mouth Rinse BID Ottie Glazier, MD   15 mL at 03/18/20 1044  . Chlorhexidine Gluconate Cloth 2 % PADS 6 each  6 each Topical Daily Awilda Bill, NP   6 each at 03/21/2020 1401  . dextrose 5 % in lactated ringers infusion   Intravenous Continuous Tukov-Yual, Arlyss Gandy, NP   Stopped at 03/18/20 0117  . fentaNYL 2544mg in NS 2525m(1076mml) infusion-PREMIX  0-400 mcg/hr Intravenous Continuous AleOttie GlazierD   Stopped at 03/18/20 1047  . heparin injection 5,000 Units  5,000 Units Subcutaneous Q8H CinHerbert PunD   5,000 Units at 03/18/20 1316  . HYDROcodone-acetaminophen (NORCO/VICODIN) 5-325 MG per tablet 1-2 tablet  1-2 tablet Oral Q4H PRN CinHerbert PunD      . hydrocortisone sodium succinate (SOLU-CORTEF) 100 MG injection 100 mg  100 mg Intravenous Q12H AleOttie GlazierD   100 mg at 03/18/20 0729  . insulin aspart (novoLOG) injection 0-9 Units  0-9 Units Subcutaneous Q4H GruDallie PilesPH   2 Units at 03/18/20 1316  . MEDLINE mouth rinse  15 mL Mouth Rinse 10 times per day AleOttie GlazierD   15 mL at 03/18/20 1318  . meropenem  (MERREM) 1 g in sodium chloride 0.9 % 100 mL IVPB  1 g Intravenous Q8H GruDallie PilesPH      . metoprolol tartrate (LOPRESSOR) tablet 12.5 mg  12.5 mg Oral Daily CinHerbert PunD      . midazolam (VERSED) injection 2 mg  2 mg Intravenous Q2H PRN BlaAwilda BillP   2 mg at 03/17/20 1145  . norepinephrine (LEVOPHED) 16 mg in 250m62memix infusion  0-40 mcg/min Intravenous Titrated CintHerbert Pun 2.81 mL/hr at 03/18/20 1200 3 mcg/min at 03/18/20 1200  . ondansetron (ZOFRAN-ODT) disintegrating tablet 4 mg  4 mg Oral Q6H PRN CintHerbert Pun       Or  . ondansetron (ZOFRAN) injection 4 mg  4 mg Intravenous Q6H PRN CintHerbert Pun  MD   4 mg at 03/15/2020 1538  . pantoprazole (PROTONIX) injection 40 mg  40 mg Intravenous QHS Herbert Pun, MD   40 mg at 03/17/20 2235  . phenylephrine CONCENTRATED 173m in sodium chloride 0.9% 2586m(0.55m3mL) infusion  0-400 mcg/min Intravenous Titrated BlaAwilda BillP   Stopped at 03/15/2020 0500  . vasopressin (PITRESSIN) 20 Units in sodium chloride 0.9 % 100 mL infusion-*FOR SHOCK*  0-0.03 Units/min Intravenous Continuous AleOttie GlazierD   Stopped at 03/17/20 1037      .  PHYSICAL EXAMINATION:  Vitals:   03/18/20 1300 03/18/20 1330  BP: 120/70 127/78  Pulse: 68 80  Resp: 20 20  Temp: 98.8 F (37.1 C) 98.8 F (37.1 C)  SpO2: 95% 96%   Filed Weights   03/27/2020 1229  Weight: 191 lb (86.6 kg)    Physical Exam Constitutional:      Comments: Patient is intubated/sedated. No family by the bedside.  HENT:     Head: Normocephalic and atraumatic.     Mouth/Throat:     Pharynx: No oropharyngeal exudate.     Comments: ET tube in place. Eyes:     Pupils: Pupils are equal, round, and reactive to light.  Cardiovascular:     Rate and Rhythm: Normal rate and regular rhythm.  Pulmonary:     Effort: Pulmonary effort is normal. No respiratory distress.     Breath sounds: Normal breath sounds. No wheezing.   Abdominal:     General: Bowel sounds are normal. There is no distension.     Palpations: Abdomen is soft. There is no mass.     Tenderness: There is no abdominal tenderness. There is no guarding or rebound.     Comments: Positive for ostomy.  Musculoskeletal:        General: No tenderness. Normal range of motion.     Cervical back: Normal range of motion and neck supple.  Skin:    General: Skin is warm.  Neurological:     Mental Status: He is alert.     Comments: Intubated/sedated  Psychiatric:        Mood and Affect: Affect normal.      LABORATORY DATA:  I have reviewed the data as listed Lab Results  Component Value Date   WBC 9.7 03/18/2020   HGB 10.7 (L) 03/18/2020   HCT 34.1 (L) 03/18/2020   MCV 94.2 03/18/2020   PLT 96 (L) 03/18/2020   Recent Labs    04/08/2020 1226 03/23/2020 1347 03/21/2020 0400 03/17/20 0500 03/18/20 0500  NA 139   < > 145 145 149*  K 3.8   < > 5.0 4.9 4.7  CL 101   < > 113* 116* 115*  CO2 21*   < > _0 GLUCOSE 300*   < > 135* 131* 169*  BUN 37*   < > 34* 37* 41*  CREATININE 3.27*   < > 2.16* 1.78* 1.52*  CALCIUM 8.8*   < > 6.6* 6.5* 7.1*  GFRNONAA 19*   < > 31* 39* 47*  GFRAA 22*   < > 36* 45* 55*  PROT 6.2*  --  5.0*  --   --   ALBUMIN 3.0*   < > 2.0*  2.1* 2.0* 2.0*  AST 27  --  113*  --   --   ALT 12  --  83*  --   --   ALKPHOS 57  --  61  --   --  BILITOT 1.1  --  0.5  --   --   BILIDIR  --   --  0.2  --   --   IBILI  --   --  0.3  --   --    < > = values in this interval not displayed.    RADIOGRAPHIC STUDIES: I have personally reviewed the radiological images as listed and agreed with the findings in the report. CT ABDOMEN PELVIS WO CONTRAST  Result Date: 03/27/2020 CLINICAL DATA:  64 year old male with history of abdominal pain. Suspected aortic dissection. EXAM: CT CHEST, ABDOMEN AND PELVIS WITHOUT CONTRAST TECHNIQUE: Multidetector CT imaging of the chest, abdomen and pelvis was performed following the standard  protocol without IV contrast. COMPARISON:  No priors. FINDINGS: Comment: Today's study is limited for detection and characterization of visceral and/or vascular lesions by lack of IV contrast material. CT CHEST FINDINGS Cardiovascular: Heart size is normal. There is no significant pericardial fluid, thickening or pericardial calcification. Small amount of gas is present in the right ventricle and pulmonic trunk. There is aortic atherosclerosis, as well as atherosclerosis of the great vessels of the mediastinum and the coronary arteries, including calcified atherosclerotic plaque in the left anterior descending coronary artery. No crescentic high attenuation associated with the wall of the thoracic aorta to suggest acute intramural hemorrhage. Small amount of gas also noted in the left axillary vein. Mediastinum/Nodes: No pathologically enlarged mediastinal or hilar lymph nodes. Esophagus is unremarkable in appearance. No axillary lymphadenopathy. Lungs/Pleura: No suspicious appearing pulmonary nodules or masses are noted. No acute consolidative airspace disease. No pleural effusions. Linear areas of scarring and/or subsegmental atelectasis are noted in the dependent portions of the lungs bilaterally. Musculoskeletal: There are no aggressive appearing lytic or blastic lesions noted in the visualized portions of the skeleton. CT ABDOMEN PELVIS FINDINGS Hepatobiliary: Mild diffuse low attenuation throughout the hepatic parenchyma, indicative of hepatic steatosis. No definite discrete cystic or solid hepatic lesions are confidently identified on today's noncontrast CT examination. Unenhanced appearance of the gallbladder is normal. Pancreas: No definite pancreatic mass or peripancreatic fluid collections or inflammatory changes are noted on today's noncontrast CT examination. Spleen: Unremarkable. Adrenals/Urinary Tract: 1.7 cm low-attenuation lesion in the medial aspect of the lower pole of the left kidney,  incompletely characterized on today's non-contrast CT examination, but statistically likely to represent a cyst. Right kidney and bilateral adrenal glands are normal in appearance. No hydroureteronephrosis. Urinary bladder is normal in appearance. Stomach/Bowel: Normal appearance of the stomach. Multiple prominent borderline dilated loops of small bowel in the left side of the abdomen measuring up to 3.1 cm in diameter with some subjective wall thickening in the region of the jejunum and small air-fluid levels. Moderate distension of the colon with fluid and gas. Potential pneumatosis in the region of the cecum best appreciated on axial image 82 of series 508 and coronal image 65 of series 511. Appendix appears dilated measuring up to 1.4 cm in diameter. Vascular/Lymphatic: Aortic atherosclerosis. No lymphadenopathy noted in the abdomen or pelvis. Reproductive: Prostate gland and seminal vesicles are unremarkable in appearance. Other: Large volume of pneumoperitoneum. In addition, there are some unusual sites of extraluminal gas most notably in the low anatomic pelvis encircling the mesorectum, and in the spaces adjacent to the seminal vesicles, some of which may potentially be within venous structures. Small volume of ascites. Musculoskeletal: There are no aggressive appearing lytic or blastic lesions noted in the visualized portions of the skeleton. IMPRESSION: 1. Large amount of pneumoperitoneum indicative  of bowel perforation. The exact site of perforation is uncertain on today's examination, however, the favored candidate sites for perforation include the cecum where there is potential pneumatosis, and the rectosigmoid region where there appears to be a small amount of gas in the veins associated with the seminal vesicles and a small amount of gas encircling the mesorectum. Surgical consultation is strongly recommended. 2. Gas right ventricle and pulmonic trunk, as well as the left axillary vein. Findings are  favored to be iatrogenic related to IV access. However, there may be some venous gas in the low anatomic pelvis, as discussed above. The possibility of ischemic bowel should be considered, and correlation with lactate levels is recommended. 3. Appendix is dilated measuring up to 14 mm in diameter. This is presumably secondary to reactive inflammation, however, attention at time of forthcoming exploratory laparotomy is recommended. 4. No definitive findings to suggest aortic dissection on today's noncontrast CT examination. 5. Hepatic steatosis. 6. Additional incidental findings, as above. Critical Value/emergent results were called by telephone at the time of interpretation on 03/24/2020 at 2:38 pm to provider MARK QUALE, who verbally acknowledged these results. Electronically Signed   By: Vinnie Langton M.D.   On: 03/25/2020 14:44   CT CHEST WO CONTRAST  Result Date: 04/08/2020 CLINICAL DATA:  64 year old male with history of abdominal pain. Suspected aortic dissection. EXAM: CT CHEST, ABDOMEN AND PELVIS WITHOUT CONTRAST TECHNIQUE: Multidetector CT imaging of the chest, abdomen and pelvis was performed following the standard protocol without IV contrast. COMPARISON:  No priors. FINDINGS: Comment: Today's study is limited for detection and characterization of visceral and/or vascular lesions by lack of IV contrast material. CT CHEST FINDINGS Cardiovascular: Heart size is normal. There is no significant pericardial fluid, thickening or pericardial calcification. Small amount of gas is present in the right ventricle and pulmonic trunk. There is aortic atherosclerosis, as well as atherosclerosis of the great vessels of the mediastinum and the coronary arteries, including calcified atherosclerotic plaque in the left anterior descending coronary artery. No crescentic high attenuation associated with the wall of the thoracic aorta to suggest acute intramural hemorrhage. Small amount of gas also noted in the left  axillary vein. Mediastinum/Nodes: No pathologically enlarged mediastinal or hilar lymph nodes. Esophagus is unremarkable in appearance. No axillary lymphadenopathy. Lungs/Pleura: No suspicious appearing pulmonary nodules or masses are noted. No acute consolidative airspace disease. No pleural effusions. Linear areas of scarring and/or subsegmental atelectasis are noted in the dependent portions of the lungs bilaterally. Musculoskeletal: There are no aggressive appearing lytic or blastic lesions noted in the visualized portions of the skeleton. CT ABDOMEN PELVIS FINDINGS Hepatobiliary: Mild diffuse low attenuation throughout the hepatic parenchyma, indicative of hepatic steatosis. No definite discrete cystic or solid hepatic lesions are confidently identified on today's noncontrast CT examination. Unenhanced appearance of the gallbladder is normal. Pancreas: No definite pancreatic mass or peripancreatic fluid collections or inflammatory changes are noted on today's noncontrast CT examination. Spleen: Unremarkable. Adrenals/Urinary Tract: 1.7 cm low-attenuation lesion in the medial aspect of the lower pole of the left kidney, incompletely characterized on today's non-contrast CT examination, but statistically likely to represent a cyst. Right kidney and bilateral adrenal glands are normal in appearance. No hydroureteronephrosis. Urinary bladder is normal in appearance. Stomach/Bowel: Normal appearance of the stomach. Multiple prominent borderline dilated loops of small bowel in the left side of the abdomen measuring up to 3.1 cm in diameter with some subjective wall thickening in the region of the jejunum and small air-fluid levels. Moderate distension  of the colon with fluid and gas. Potential pneumatosis in the region of the cecum best appreciated on axial image 82 of series 508 and coronal image 65 of series 511. Appendix appears dilated measuring up to 1.4 cm in diameter. Vascular/Lymphatic: Aortic  atherosclerosis. No lymphadenopathy noted in the abdomen or pelvis. Reproductive: Prostate gland and seminal vesicles are unremarkable in appearance. Other: Large volume of pneumoperitoneum. In addition, there are some unusual sites of extraluminal gas most notably in the low anatomic pelvis encircling the mesorectum, and in the spaces adjacent to the seminal vesicles, some of which may potentially be within venous structures. Small volume of ascites. Musculoskeletal: There are no aggressive appearing lytic or blastic lesions noted in the visualized portions of the skeleton. IMPRESSION: 1. Large amount of pneumoperitoneum indicative of bowel perforation. The exact site of perforation is uncertain on today's examination, however, the favored candidate sites for perforation include the cecum where there is potential pneumatosis, and the rectosigmoid region where there appears to be a small amount of gas in the veins associated with the seminal vesicles and a small amount of gas encircling the mesorectum. Surgical consultation is strongly recommended. 2. Gas right ventricle and pulmonic trunk, as well as the left axillary vein. Findings are favored to be iatrogenic related to IV access. However, there may be some venous gas in the low anatomic pelvis, as discussed above. The possibility of ischemic bowel should be considered, and correlation with lactate levels is recommended. 3. Appendix is dilated measuring up to 14 mm in diameter. This is presumably secondary to reactive inflammation, however, attention at time of forthcoming exploratory laparotomy is recommended. 4. No definitive findings to suggest aortic dissection on today's noncontrast CT examination. 5. Hepatic steatosis. 6. Additional incidental findings, as above. Critical Value/emergent results were called by telephone at the time of interpretation on 04/06/2020 at 2:38 pm to provider MARK QUALE, who verbally acknowledged these results. Electronically Signed    By: Vinnie Langton M.D.   On: 03/23/2020 14:44   DG Chest Port 1 View  Result Date: 03/29/2020 CLINICAL DATA:  Central line placement EXAM: PORTABLE CHEST 1 VIEW COMPARISON:  12:52 p.m. FINDINGS: Right internal jugular central venous catheter is in place with its tip at the superior cavoatrial junction. Endotracheal tube is in place with its tip at the level of the clavicular heads, 6.8 cm above the carina. Nasogastric tube extends into the left upper quadrant the abdomen. Lung volumes are small, but are symmetric. Retrocardiac opacification likely relates to atelectasis within this region. No pneumothorax or pleural effusion. Cardiac size within normal limits. Pulmonary vascularity normal. IMPRESSION: 1. Right internal jugular central venous catheter tip at the superior cavoatrial junction. No pneumothorax. 2. Endotracheal tube and nasogastric tube in good position. 3. Retrocardiac opacification, likely atelectasis. Electronically Signed   By: Fidela Salisbury MD   On: 04/10/2020 20:31   DG Chest Port 1 View  Result Date: 03/19/2020 CLINICAL DATA:  Questionable sepsis, abdominal pain, evaluate for free air. EXAM: PORTABLE CHEST 1 VIEW COMPARISON:  No pertinent prior exams are available for comparison. FINDINGS: Heart size within normal limits. Aortic atherosclerosis. There is free intraperitoneal air beneath the diaphragm bilaterally. Elevation of the hemidiaphragms bilaterally with minimal bibasilar atelectasis. The lungs are otherwise clear. No evidence of pleural effusion or pneumothorax. No acute bony abnormality identified. These results were called by telephone at the time of interpretation on 04/10/2020 at 1:15 pm to provider MARK QUALE , who verbally acknowledged these results. IMPRESSION: Intraperitoneal free  air beneath both hemidiaphragms concerning for perforated viscus. Elevation of the hemidiaphragms bilaterally with minimal bibasilar atelectasis. The lungs are otherwise clear. Aortic  Atherosclerosis (ICD10-I70.0). 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 Date of Exam: 03/20/2020 Medical Rec #:  [id number]              Height:       68.0 in Accession #:    [id number]             Weight:       191.0 lb Date of Birth:  07/18/1955               BSA:          2.004 m Patient Age:    18 years               BP:           81/63 mmHg Patient Gender: M                      HR:           74 bpm. Exam Location:  [facility] Procedure: 2D Echo, Color Doppler and Cardiac Doppler Indications:     I77.82 CHF-Acute Systolic  History:         Patient has no prior history of Echocardiogram examinations.                  Risk Factors:Hypertension and Diabetes.  Sonographer:     Charmayne Sheer RDCS (AE) Referring Phys:  [id number] Ottie Glazier Diagnosing Phys: Ida Rogue MD  Sonographer Comments: Echo performed with patient supine and on artificial respirator and no subcostal window. Image acquisition challenging due to respiratory motion. IMPRESSIONS  1. Left ventricular ejection fraction, by estimation, is 35 to 40%. The left ventricle has moderately decreased function. The left ventricle demonstrates global hypokinesis. Left ventricular diastolic parameters are consistent with Grade II diastolic dysfunction (pseudonormalization).  2. Right ventricular systolic function is mildly reduced. The right ventricular size is normal. Tricuspid regurgitation signal is inadequate for assessing PA pressure.  3. The mitral valve is normal in structure. Mild mitral valve regurgitation. FINDINGS  Left Ventricle: Left ventricular ejection fraction, by estimation, is 35 to 40%. The left ventricle has moderately decreased function. The left ventricle demonstrates global hypokinesis. The left ventricular internal cavity size was normal in size. There is no left ventricular hypertrophy. Left  ventricular diastolic parameters are consistent with Grade II diastolic dysfunction (pseudonormalization). Right Ventricle: The right ventricular size is normal. No increase in right ventricular wall thickness. Right ventricular systolic function is mildly reduced. Tricuspid regurgitation signal is inadequate for assessing PA pressure. Left Atrium: Left atrial size was normal in size. Right Atrium: Right atrial size was normal in size. Pericardium: There is no evidence of pericardial effusion. Mitral Valve: The mitral valve is normal in structure. Normal mobility of the mitral valve leaflets. Mild mitral valve regurgitation. No evidence of mitral valve stenosis. MV peak gradient, 2.0 mmHg. The mean mitral valve gradient is 1.0 mmHg. Tricuspid Valve: The tricuspid valve is normal in structure. Tricuspid valve regurgitation is mild . No evidence of tricuspid stenosis. Aortic Valve: The aortic valve is normal in structure. Aortic valve regurgitation is not visualized. No aortic stenosis is present. Aortic valve mean gradient measures 2.0 mmHg.  Aortic valve peak gradient measures 3.4 mmHg. Aortic valve area, by VTI measures 3.97 cm. Pulmonic Valve: The pulmonic valve was normal in structure. Pulmonic valve regurgitation is not visualized. No evidence of pulmonic stenosis. Aorta: The aortic root is normal in size and structure. Venous: The inferior vena cava is normal in size with greater than 50% respiratory variability, suggesting right atrial pressure of 3 mmHg. IAS/Shunts: No atrial level shunt detected by color flow Doppler.  LEFT VENTRICLE PLAX 2D LVIDd:         4.34 cm  Diastology LVIDs:         3.29 cm  LV e' lateral:   9.68 cm/s LV PW:         1.04 cm  LV E/e' lateral: 6.3 LV IVS:        0.86 cm  LV e' medial:    5.77 cm/s LVOT diam:     2.70 cm  LV E/e' medial:  10.5 LV SV:         55 LV SV Index:   27 LVOT Area:     5.73 cm  RIGHT VENTRICLE RV Basal diam:  3.26 cm LEFT ATRIUM             Index       RIGHT  ATRIUM           Index LA diam:        3.90 cm 1.95 cm/m  RA Area:     11.70 cm LA Vol (A2C):   24.5 ml 12.22 ml/m RA Volume:   25.40 ml  12.67 ml/m LA Vol (A4C):   34.9 ml 17.41 ml/m LA Biplane Vol: 31.7 ml 15.82 ml/m  AORTIC VALVE                   PULMONIC VALVE AV Area (Vmax):    4.74 cm    PV Vmax:       0.65 m/s AV Area (Vmean):   5.00 cm    PV Vmean:      45.600 cm/s AV Area (VTI):     3.97 cm    PV VTI:        0.081 m AV Vmax:           92.20 cm/s  PV Peak grad:  1.7 mmHg AV Vmean:          59.400 cm/s PV Mean grad:  1.0 mmHg AV VTI:            0.138 m AV Peak Grad:      3.4 mmHg AV Mean Grad:      2.0 mmHg LVOT Vmax:         76.40 cm/s LVOT Vmean:        51.900 cm/s LVOT VTI:          0.096 m LVOT/AV VTI ratio: 0.69  AORTA Ao Root diam: 3.60 cm MITRAL VALVE MV Area (PHT): 3.43 cm    SHUNTS MV Peak grad:  2.0 mmHg    Systemic VTI:  0.10 m MV Mean grad:  1.0 mmHg    Systemic Diam: 2.70 cm MV Vmax:       0.72 m/s MV Vmean:      48.6 cm/s MV Decel Time: 221 msec MV E velocity: 60.80 cm/s MV A velocity: 60.40 cm/s MV E/A ratio:  1.01 Ida Rogue MD Electronically signed by Ida Rogue MD Signature Date/Time: 03/17/2020/4:24:38 PM    Final     Cancer of descending colon (  [place]) #64 year old male patient currently in the hospital for bowel perforation/diagnosed with left-sided colon cancer  #Left-sided colon cancer stage III- 5/13LN positive for malignancy moderately differentiated.  #Bowel perforation/s/p right hemicolectomy-septic shock-currently intubated; broad-spectrum antibiotics. Overall improving.   Recommendations:  #Ideally patient would be a candidate for adjuvant chemotherapy FOLFOX; however this will have to be delayed/reevaluated after patient's resolution of acute illness. Patient will need a port at the time of discharge. Patient will need a PET scan to further evaluate the stage of disease. Discussed with Dr. Peyton Najjar.  #Thank you Dr.Aleskerov  for allowing me to  participate in the care of your pleasant patient. Please do not hesitate to contact me with questions or concerns in the interim. I left a message for patient daughter Donald Stephenson-to discuss above recommendations/plan.   All questions were answered. The patient knows to call the clinic with any problems, questions or concerns.   Cammie Sickle, MD 03/18/2020 1:45 PM

## 2020-03-18 NOTE — Progress Notes (Signed)
CRITICAL CARE NOTE 64yo with PMH as below came in for abdominal pain and lethargy, found to be in shock and had CXR with air under the diaphragm. CT abd with bowel perforation , patient taken to OR emergently found to have 700cc mucopurulent material s/p surgery with resection and post operative septic shock. Patient brought to MICU on MV with open abdomen for medical management.   03/15/20- patient remains critically ill on MV, on elevated settings with vasopressor support. Spoke to wife and daughter at bedside today. Severe hyperglycemia will add on levemir maintanance. Renal function improved slightly. Reviwed short term care plan with Dr Peyton Najjar.  04/11/2020- Patient scheduled for OR today. Reviewed short term care plan with wife. Letter for out of work excuse provided. Discussed case with Anesthesiologist this am.   Patient febrile this am. Leukocystosis trending up partially due to demargination from solucortef therapy, weaned off neosynephrine overnight. Flud balance 8L positive. DIC excluded. Hepatic panel in process if normal will deliver Ofirmev. 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 I have also reached out to family and reviewed most recent findings with daughter [name].  Today plan is to wean down norepi and continue medical therapy for septic shock. Will place consult for onc eval to establish care plan for future should he survive this critical illness.  9/5 septic shock and resp failure, abd catastrophe  CC  follow up respiratory failure  SUBJECTIVE Patient remains critically ill Prognosis is guarded   BP 115/74    Pulse 76    Temp 99.7 F (37.6 C)    Resp 20    Ht 5' 7.99" (1.727 m)    Wt 86.6 kg    SpO2 91%    BMI 29.05 kg/m    I/O last 3 completed shifts: In: 3277.3 [I.V.:2676.9; IV Piggyback:600.4] Out: 2951 [Urine:3495; Drains:650] Total I/O In: -  Out: 150 [Urine:150]  SpO2: 91 % O2 Flow Rate (L/min): 3  L/min FiO2 (%): 28 %  Estimated body mass index is 29.05 kg/m as calculated from the following:   Height as of this encounter: 5' 7.99" (1.727 m).   Weight as of this encounter: 86.6 kg.  SIGNIFICANT EVENTS   REVIEW OF SYSTEMS  PATIENT IS UNABLE TO PROVIDE COMPLETE REVIEW OF SYSTEMS DUE TO SEVERE CRITICAL ILLNESS        PHYSICAL EXAMINATION:  GENERAL:critically ill appearing, +resp distress HEAD: Normocephalic, atraumatic.  EYES: Pupils equal, round, reactive to light.  No scleral icterus.  MOUTH: Moist mucosal membrane. NECK: Supple.  PULMONARY: +rhonchi, +wheezing CARDIOVASCULAR: S1 and S2. Regular rate and rhythm. No murmurs, rubs, or gallops.  GASTROINTESTINAL: open, wound no BS MUSCULOSKELETAL: No swelling, clubbing, or edema.  NEUROLOGIC: obtunded, GCS<8 SKIN:intact,warm,dry  MEDICATIONS: I have reviewed all medications and confirmed regimen as documented   CULTURE RESULTS   Recent Results (from the past 240 hour(s))  SARS Coronavirus 2 by RT PCR (hospital order, performed in [facility] hospital lab) Nasopharyngeal Nasopharyngeal Swab     Status: None   Collection Time: 03/17/2020 12:26 PM   Specimen: Nasopharyngeal Swab  Result Value Ref Range Status   SARS Coronavirus 2 NEGATIVE NEGATIVE Final    Comment: (NOTE) SARS-CoV-2 target nucleic acids are NOT DETECTED.  The SARS-CoV-2 RNA is generally detectable in upper and lower respiratory specimens during the acute phase of infection. The lowest concentration of SARS-CoV-2 viral copies this assay can detect is 250 copies / mL. A negative result does not preclude SARS-CoV-2  infection and should not be used as the sole basis for treatment or other patient management decisions.  A negative result may occur with improper specimen collection / handling, submission of specimen other than nasopharyngeal swab, presence of viral mutation(s) within the areas targeted by this assay, and inadequate number of viral  copies (<250 copies / mL). A negative result must be combined with clinical observations, patient history, and epidemiological information.  Fact Sheet for Patients:   StrictlyIdeas.no  Fact Sheet for Healthcare Providers: BankingDealers.co.za  This test is not yet approved or  cleared by the [place] FDA and has been authorized for detection and/or diagnosis of SARS-CoV-2 by FDA under an Emergency Use Authorization (EUA).  This EUA will remain in effect (meaning this test can be used) for the duration of the COVID-19 declaration under Section 564(b)(1) of the Act, 21 U.S.C. section 360bbb-3(b)(1), unless the authorization is terminated or revoked sooner.  Performed at [facility], [address] [place], [place] [address]   Blood culture (routine single)     Status: None (Preliminary result)   Collection Time: 04/12/2020  1:39 PM   Specimen: BLOOD  Result Value Ref Range Status   Specimen Description   Final    BLOOD BLOOD RIGHT FOREARM Performed at [facility], [address] [place], [place] [address]    Special Requests   Final    BOTTLES DRAWN AEROBIC AND ANAEROBIC Blood Culture results may not be optimal due to an excessive volume of blood received in culture bottles Performed at [facility], [address], [place], [place] [address]    Culture  Setup Time   Final    GRAM NEGATIVE RODS ANAEROBIC BOTTLE ONLY CRITICAL RESULT CALLED TO, READ BACK BY AND VERIFIED WITH: DUSTIN ZEIGLER AT 5284 ON 03/25/2020 SNG Performed at [facility], [place] [address], [place], [place] [address]    Culture GRAM NEGATIVE RODS  Final   Report Status PENDING  Incomplete  Blood Culture ID Panel (Reflexed)     Status: None   Collection Time: 04/09/2020  1:39 PM  Result Value Ref Range Status   Enterococcus faecalis NOT DETECTED NOT DETECTED Final   Enterococcus Faecium NOT DETECTED NOT DETECTED Final    Listeria monocytogenes NOT DETECTED NOT DETECTED Final   Staphylococcus species NOT DETECTED NOT DETECTED Final   Staphylococcus aureus (BCID) NOT DETECTED NOT DETECTED Final   Staphylococcus epidermidis NOT DETECTED NOT DETECTED Final   Staphylococcus lugdunensis NOT DETECTED NOT DETECTED Final   Streptococcus species NOT DETECTED NOT DETECTED Final   Streptococcus agalactiae NOT DETECTED NOT DETECTED Final   Streptococcus pneumoniae NOT DETECTED NOT DETECTED Final   Streptococcus pyogenes NOT DETECTED NOT DETECTED Final   A.calcoaceticus-baumannii NOT DETECTED NOT DETECTED Final   Bacteroides fragilis NOT DETECTED NOT DETECTED Final   Enterobacterales NOT DETECTED NOT DETECTED Final   Enterobacter cloacae complex NOT DETECTED NOT DETECTED Final   Escherichia coli NOT DETECTED NOT DETECTED Final   Klebsiella aerogenes NOT DETECTED NOT DETECTED Final   Klebsiella oxytoca NOT DETECTED NOT DETECTED Final   Klebsiella pneumoniae NOT DETECTED NOT DETECTED Final   Proteus species NOT DETECTED NOT DETECTED Final   Salmonella species NOT DETECTED NOT DETECTED Final   Serratia marcescens NOT DETECTED NOT DETECTED Final   Haemophilus influenzae NOT DETECTED NOT DETECTED Final   Neisseria meningitidis NOT DETECTED NOT DETECTED Final   Pseudomonas aeruginosa NOT DETECTED NOT DETECTED Final   Stenotrophomonas maltophilia NOT DETECTED NOT DETECTED  Final   Candida albicans NOT DETECTED NOT DETECTED Final   Candida auris NOT DETECTED NOT DETECTED Final   Candida glabrata NOT DETECTED NOT DETECTED Final   Candida krusei NOT DETECTED NOT DETECTED Final   Candida parapsilosis NOT DETECTED NOT DETECTED Final   Candida tropicalis NOT DETECTED NOT DETECTED Final   Cryptococcus neoformans/gattii NOT DETECTED NOT DETECTED Final    Comment: Performed at [facility], [address] [place], [place] [address]  Aerobic/Anaerobic Culture (surgical/deep wound)     Status: None (Preliminary result)    Collection Time: 03/27/2020  3:18 PM   Specimen: PATH Other; Body Fluid  Result Value Ref Range Status   Specimen Description   Final    PERITONEAL Performed at [facility], [address], [place], [place] [address]    Special Requests   Final    PERIOTTONEAL CAVITY FLUID Performed at [facility], [place], [place] [address]    Gram Stain   Final    MODERATE WBC PRESENT, PREDOMINANTLY PMN ABUNDANT GRAM NEGATIVE RODS FEW GRAM POSITIVE COCCI RARE GRAM POSITIVE RODS Performed at [facility], [place] [address] [place], [place] [address]    Culture   Final    FEW KLEBSIELLA ORNITHINOLYTICA FEW ESCHERICHIA COLI MIXED ANAEROBIC FLORA PRESENT.  CALL LAB IF FURTHER IID REQUIRED.    Report Status PENDING  Incomplete  MRSA PCR Screening     Status: None   Collection Time: 03/15/2020  6:21 PM   Specimen: Nasopharyngeal  Result Value Ref Range Status   MRSA by PCR NEGATIVE NEGATIVE Final    Comment:        The GeneXpert MRSA Assay (FDA approved for NASAL specimens only), is one component of a comprehensive MRSA colonization surveillance program. It is not intended to diagnose MRSA infection nor to guide or monitor treatment for MRSA infections. Performed at [facility], [address], [place], [place] [address]           IMAGING    No results found.   Nutrition Status: Nutrition Problem: Inadequate oral intake Etiology: inability to eat Signs/Symptoms: NPO status Interventions: Refer to RD note for recommendations   BMP Latest Ref Rng & Units 03/18/2020 03/17/2020 03/16/2020  Glucose 70 - 99 mg/dL 169(H) 131(H) 135(H)  BUN 8 - 23 mg/dL 41(H) 37(H) 34(H)  Creatinine 0.61 - 1.24 mg/dL 1.52(H) 1.78(H) 2.16(H)  Sodium 135 - 145 mmol/L 149(H) 145 145  Potassium 3.5 - 5.1 mmol/L 4.7 4.9 5.0  Chloride 98 - 111 mmol/L 115(H) 116(H) 113(H)  CO2 22 - 32 mmol/L 24 23 23   Calcium 8.9 - 10.3 mg/dL 7.1(L) 6.5(L) 6.6(L)      Indwelling Urinary Catheter continued, requirement due to   Reason to continue Indwelling Urinary Catheter strict Intake/Output monitoring for hemodynamic instability   Central Line/ continued, requirement due to  Reason to continue [place] of central venous pressure or other hemodynamic parameters and poor IV access   Ventilator continued, requirement due to severe respiratory failure   Ventilator Sedation RASS 0 to -2      ASSESSMENT AND PLAN SYNOPSIS   Severe ACUTE Hypoxic and Hypercapnic Respiratory Failure due to severe septic shock from perforated viscus  -continue Full MV support -continue Bronchodilator Therapy -Wean Fio2 and PEEP as tolerated -VAP/VENT bundle implementation   ACUTE KIDNEY INJURY/Renal Failure -continue Foley Catheter-assess need -Avoid nephrotoxic agents -Follow urine output, BMP -Ensure adequate renal perfusion, optimize oxygenation -Renal dose medications  NEUROLOGY - intubated and sedated - minimal sedation to achieve a RASS goal: -1 Wake up assessment pending  Acute toxic metabolic encephalopathy, need for sedation Goal RASS -2 to -3  SHOCK-SEPSIS -use vasopressors to keep MAP>65 -follow ABG and LA -follow up cultures -emperic ABX -consider stress dose steroids -aggressive IV fluid resuscitation  CARDIAC ICU monitoring  ID -continue IV abx as prescibed -follow up cultures  GI GI PROPHYLAXIS as indicated  NUTRITIONAL STATUS Nutrition Status: Nutrition Problem: Inadequate oral intake Etiology: inability to eat Signs/Symptoms: NPO status Interventions: Refer to RD note for recommendations   DIET-->TF's as tolerated Constipation protocol as indicated  ENDO - will use ICU hypoglycemic\Hyperglycemia protocol if indicated     ELECTROLYTES -follow labs as needed -replace as needed -pharmacy consultation and following   DVT/GI PRX ordered and assessed TRANSFUSIONS AS NEEDED MONITOR  FSBS I Assessed the need for Labs I Assessed the need for Foley I Assessed the need for Central Venous Line Family Discussion when available I Assessed the need for Mobilization I made an Assessment of medications to be adjusted accordingly Safety Risk assessment completed   CASE DISCUSSED IN MULTIDISCIPLINARY ROUNDS WITH ICU TEAM  Critical Care Time devoted to patient care services described in this note is 34 minutes.   Overall, patient is critically ill, prognosis is guarded.  Patient with Multiorgan failure and at high risk for cardiac arrest and death.   PROGNOSIS IS POOR  Corrin Parker, M.D.  [facility]  Medical Director [place] Director [facility] Cardio-Pulmonary Department

## 2020-03-18 NOTE — Progress Notes (Signed)
[facility] Hospital Day(s): 4.   Post op day(s): 2 Days Post-Op.   Interval History: Patient seen and examined.  Patient continue critically ill.  Patient recovering slowly.  Today just with Levophed low-dose.  Patient following simple commands.  Currently with sedation hold.  Vital signs in last 24 hours: [min-max] current  Temp:  [99.1 F (37.3 C)-100.2 F (37.9 C)] 99.7 F (37.6 C) (09/05 0830) Pulse Rate:  [64-80] 76 (09/05 0830) Resp:  [18-22] 20 (09/05 0830) BP: (83-132)/(52-78) 115/74 (09/05 0830) SpO2:  [91 %-97 %] 93 % (09/05 0935) Arterial Line BP: (95-131)/(49-61) 131/59 (09/05 0800) FiO2 (%):  [28 %] 28 % (09/05 0935)     Height: 5' 7.99" (172.7 cm) Weight: 86.6 kg BMI (Calculated): 29.05   Physical Exam:  Constitutional: Lethargic, following simple commands upon arousal Respiratory: breathing non-labored at rest on mechanical ventilation Cardiovascular: regular rate and sinus rhythm  Gastrointestinal: soft, non-tender, and non-distended. Negative pressure dressing. Ostomy patent.   Labs:  CBC Latest Ref Rng & Units 03/18/2020 03/17/2020 03/17/2020  WBC 4.0 - 10.5 K/uL 9.7 13.2(H) 17.1(H)  Hemoglobin 13.0 - 17.0 g/dL 10.7(L) 10.5(L) 10.9(L)  Hematocrit 39 - 52 % 34.1(L) 32.0(L) 34.3(L)  Platelets 150 - 400 K/uL 96(L) 123(L) 156   CMP Latest Ref Rng & Units 03/18/2020 03/17/2020 03/17/2020  Glucose 70 - 99 mg/dL 169(H) 131(H) 135(H)  BUN 8 - 23 mg/dL 41(H) 37(H) 34(H)  Creatinine 0.61 - 1.24 mg/dL 1.52(H) 1.78(H) 2.16(H)  Sodium 135 - 145 mmol/L 149(H) 145 145  Potassium 3.5 - 5.1 mmol/L 4.7 4.9 5.0  Chloride 98 - 111 mmol/L 115(H) 116(H) 113(H)  CO2 22 - 32 mmol/L 24 23 23   Calcium 8.9 - 10.3 mg/dL 7.1(L) 6.5(L) 6.6(L)  Total Protein 6.5 - 8.1 g/dL - - 5.0(L)  Total Bilirubin 0.3 - 1.2 mg/dL - - 0.5  Alkaline Phos 38 - 126 U/L - - 61  AST 15 - 41 U/L - - 113(H)  ALT 0 - 44 U/L - - 83(H)    Imaging studies: No new pertinent imaging  studies   Assessment/Plan:  64 y.o.malewith descending colon obstructive mass with cecum perforation4Day Post-Ops/p subtotal colectomy and interval reopening of recent laparotomy with drainage of intra-abdominal abscess and ileostomy creation postoperative day #2, complicated by pertinent comorbidities including, respiratory failure mechanical ventilation due to hypoxia,septic shock, acute kidney injury, diabetes, hypertension, and newly diagnosed malignant neoplasm of the splenic flexure.  Patient continue critically ill but slowly improving.  Today out of 2 pressors.  Levophed currently on very low-dose.  Patient without sedation and starting to wake up following simple commands.  Appreciate hospitalist management of medical ventilation.  Discussed with and to give a trial of extubation.  No contraindication from attempted to extubate if possible.  White blood cell count decreasing.  Hemoglobin stable.  Improving renal function with adequate urine output.  Patient still with ileus so I would recommend to keep NGT in place.  I discussed with wife and the daughter about patient situation.  I also discussed case with oncologist.  Arnold Long, MD

## 2020-03-18 NOTE — Assessment & Plan Note (Addendum)
#  64 year old male patient currently in the hospital for bowel perforation/diagnosed with left-sided colon cancer  #Left-sided colon cancer stage III- 5/13LN positive for malignancy moderately differentiated.  #Bowel perforation/s/p right hemicolectomy-septic shock-currently intubated; broad-spectrum antibiotics. Overall improving.   Recommendations:  #Ideally patient would be a candidate for adjuvant chemotherapy FOLFOX; however this will have to be delayed/reevaluated after patient's resolution of acute illness. Patient will need a port at the time of discharge. Patient will need a PET scan to further evaluate the stage of disease. Discussed with Dr. Peyton Najjar.  #Thank you Dr.Aleskerov  for allowing me to participate in the care of your pleasant patient. Please do not hesitate to contact me with questions or concerns in the interim. I left a message for [name]-to discuss above recommendations/plan.

## 2020-03-18 NOTE — Progress Notes (Addendum)
Pharmacy Antibiotic Note  Donald Stephenson. is a 64 y.o. male admitted on 04/04/2020 with intraabdominal infection secondary to perforated bowel s/p colectomy 9/1 and interval reopening of recent laparotomy with drainage of intra-abdominal abscess and ileostomy creation postoperative day # 2  Pharmacy was consulted for meropenem dosing.  Meropenem initiated, Zosyn discontinued to provide broader coverage considering clinical status and blood culture result. Blood Cx growing GNR, no organism detected on BCID. His renal function has significantly improved since admission  Plan:    adjust meropenem dose to 1 gram IV every 8 hours   Height: 5' 7.99" (172.7 cm) Weight: 86.6 kg (191 lb) IBW/kg (Calculated) : 68.38  Temp (24hrs), Avg:99.9 F (37.7 C), Min:99.1 F (37.3 C), Max:100.2 F (37.9 C)  Recent Labs  Lab 03/23/2020 1226 03/15/2020 1226 03/29/2020 1347 04/08/2020 1821 03/30/2020 2104 03/15/20 0044 03/15/20 0419 03/15/20 2009 04/11/2020 0400 03/17/20 0401 03/17/20 0500 03/17/20 1310 03/18/20 0500  WBC 5.8  --   --   --   --   --  8.2  --  17.1* 13.2*  --   --  9.7  CREATININE 3.27*   < > 3.50*  --   --   --  2.84*  --  2.16*  --  1.78*  --  1.52*  LATICACIDVEN 6.9*  --   --    < > 6.2* 4.0*  --  2.7* 2.1*  --   --  1.8  --    < > = values in this interval not displayed.    Estimated Creatinine Clearance: 51.2 mL/min (A) (by C-G formula based on SCr of 1.52 mg/dL (H)).    Antimicrobials this admission: Zosyn 3.375g q8h  03/15/20>>03/17/2020 Anidulafungin 200mg  q24h  04/02/2020>>  Meropenem 04/04/2020>>  Microbiology results: 9/1 BCID: No organism detected 9/1 Blood Cx: GNR in 1/2 sets 9/1 Peritoneal Cx: (pending) GNR, few K ornithinolytica, E coli  Thank you for allowing pharmacy to be a part of this patient's care.  Dallie Piles 03/18/2020 8:07 AM

## 2020-03-18 NOTE — Progress Notes (Signed)
Pt extubated to 2 L nasal cannula. Tolerating well. Intermittently follows commands. Disoriented X 3. JP drains intact. NPWT intact. Adequate foley output. NG intact to intermittent suction. KUB ordered to verify NG tube placement. Will continue to monitor.

## 2020-03-19 LAB — RENAL FUNCTION PANEL
Albumin: 2 g/dL — ABNORMAL LOW (ref 3.5–5.0)
Anion gap: 10 (ref 5–15)
BUN: 41 mg/dL — ABNORMAL HIGH (ref 8–23)
CO2: 24 mmol/L (ref 22–32)
Calcium: 7.5 mg/dL — ABNORMAL LOW (ref 8.9–10.3)
Chloride: 120 mmol/L — ABNORMAL HIGH (ref 98–111)
Creatinine, Ser: 1.24 mg/dL (ref 0.61–1.24)
GFR calc Af Amer: >60 mL/min (ref >60)
GFR calc non Af Amer: >60 mL/min (ref >60)
Glucose, Bld: 196 mg/dL — ABNORMAL HIGH (ref 70–99)
Phosphorus: 2.4 mg/dL — ABNORMAL LOW (ref 2.5–4.6)
Potassium: 4.4 mmol/L (ref 3.5–5.1)
Sodium: 154 mmol/L — ABNORMAL HIGH (ref 135–145)

## 2020-03-19 LAB — GLUCOSE, CAPILLARY
Glucose-Capillary: 158 mg/dL — ABNORMAL HIGH (ref 70–99)
Glucose-Capillary: 170 mg/dL — ABNORMAL HIGH (ref 70–99)
Glucose-Capillary: 170 mg/dL — ABNORMAL HIGH (ref 70–99)
Glucose-Capillary: 170 mg/dL — ABNORMAL HIGH (ref 70–99)
Glucose-Capillary: 186 mg/dL — ABNORMAL HIGH (ref 70–99)
Glucose-Capillary: 203 mg/dL — ABNORMAL HIGH (ref 70–99)

## 2020-03-19 LAB — CBC WITH DIFFERENTIAL/PLATELET
Abs Immature Granulocytes: 0.17 10*3/uL — ABNORMAL HIGH (ref 0.00–0.07)
Basophils Absolute: 0 10*3/uL (ref 0.0–0.1)
Basophils Relative: 0 %
Eosinophils Absolute: 0 10*3/uL (ref 0.0–0.5)
Eosinophils Relative: 0 %
HCT: 36.1 % — ABNORMAL LOW (ref 39.0–52.0)
Hemoglobin: 11.2 g/dL — ABNORMAL LOW (ref 13.0–17.0)
Immature Granulocytes: 1 %
Lymphocytes Relative: 9 %
Lymphs Abs: 1.1 10*3/uL (ref 0.7–4.0)
MCH: 29.1 pg (ref 26.0–34.0)
MCHC: 31 g/dL (ref 30.0–36.0)
MCV: 93.8 fL (ref 80.0–100.0)
Monocytes Absolute: 0.6 10*3/uL (ref 0.1–1.0)
Monocytes Relative: 5 %
Neutro Abs: 10.2 10*3/uL — ABNORMAL HIGH (ref 1.7–7.7)
Neutrophils Relative %: 85 %
Platelets: 94 10*3/uL — ABNORMAL LOW (ref 150–400)
RBC: 3.85 MIL/uL — ABNORMAL LOW (ref 4.22–5.81)
RDW: 14.2 % (ref 11.5–15.5)
WBC Morphology: INCREASED
WBC: 12 10*3/uL — ABNORMAL HIGH (ref 4.0–10.5)
nRBC: 0 % (ref 0.0–0.2)

## 2020-03-19 LAB — MAGNESIUM: Magnesium: 2.5 mg/dL — ABNORMAL HIGH (ref 1.7–2.4)

## 2020-03-19 NOTE — Progress Notes (Signed)
Assisted family with tele-visit via elink 

## 2020-03-19 NOTE — Progress Notes (Signed)
CRITICAL CARE NOTE 64yo with PMH as below came in for abdominal pain and lethargy, found to be in shock and had CXR with air under the diaphragm. CT abd with bowel perforation , patient taken to OR emergently found to have 700cc mucopurulent material s/p surgery with resection and post operative septic shock. Patient brought to MICU on MV with open abdomen for medical management.   03/15/20- patient remains critically ill on MV, on elevated settings with vasopressor support. Spoke to wife and daughter at bedside today. Severe hyperglycemia will add on levemir maintanance. Renal function improved slightly. Reviwed short term care plan with Dr Peyton Najjar.  04/12/2020- Patient scheduled for OR today. Reviewed short term care plan with wife. Letter for out of work excuse provided. Discussed case with Anesthesiologist this am.   Patient febrile this am. Leukocystosis trending up partially due to demargination from solucortef therapy, weaned off neosynephrine overnight. Flud balance 8L positive. DIC excluded. Hepatic panel in process if normal will deliver Ofirmev. 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 I have also reached out to family and reviewed most recent findings with daughter [name].  Today plan is to wean down norepi and continue medical therapy for septic shock. Will place consult for onc eval to establish care plan for future should he survive this critical illness.  9/5 septic shock and resp failure, abd catastrophe 03/19/20- Extubated, on nasal cannula, although frequent suctioning required. Off pressors  CC  follow up respiratory failure  SUBJECTIVE Patient remains critically ill   BP 136/73   Pulse (!) 32   Temp 97.8 F (36.6 C) (Oral)   Resp 14   Ht 5' 7.99" (1.727 m)   Wt 86.6 kg   SpO2 94%   BMI 29.05 kg/m    I/O last 3 completed shifts: In: 2687.3 [I.V.:1062.5; UXLKG:4010; IV Piggyback:479.7] Out: 2830 [Urine:2430;  Drains:325; Stool:75] Total I/O In: 171.1 [I.V.:159.7; IV Piggyback:11.4] Out: 620 [Urine:500; Drains:120]  SpO2: 94 % O2 Flow Rate (L/min): 4 L/min FiO2 (%): 28 %  Estimated body mass index is 29.05 kg/m as calculated from the following:   Height as of this encounter: 5' 7.99" (1.727 m).   Weight as of this encounter: 86.6 kg.  SIGNIFICANT EVENTS   REVIEW OF SYSTEMS  PATIENT IS UNABLE TO PROVIDE COMPLETE REVIEW OF SYSTEMS DUE TO SEVERE CRITICAL ILLNESS        PHYSICAL EXAMINATION:  GENERAL:critically ill appearing, +resp distress HEAD: Normocephalic, atraumatic.  EYES: Pupils equal, round, reactive to light.  No scleral icterus.  MOUTH: Moist mucosal membrane. NECK: Supple.  PULMONARY: +rhonchi, +wheezing CARDIOVASCULAR: S1 and S2. Regular rate and rhythm. No murmurs, rubs, or gallops.  GASTROINTESTINAL: open, wound no BS MUSCULOSKELETAL: No swelling, clubbing, or edema.  NEUROLOGIC: obtunded, GCS<8 SKIN:intact,warm,dry  MEDICATIONS: I have reviewed all medications and confirmed regimen as documented   CULTURE RESULTS   Recent Results (from the past 240 hour(s))  SARS Coronavirus 2 by RT PCR (hospital order, performed in [facility] hospital lab) Nasopharyngeal Nasopharyngeal Swab     Status: None   Collection Time: 04/09/2020 12:26 PM   Specimen: Nasopharyngeal Swab  Result Value Ref Range Status   SARS Coronavirus 2 NEGATIVE NEGATIVE Final    Comment: (NOTE) SARS-CoV-2 target nucleic acids are NOT DETECTED.  The SARS-CoV-2 RNA is generally detectable in upper and lower respiratory specimens during the acute phase of infection. The lowest concentration of SARS-CoV-2 viral copies this assay can detect is 250 copies /  mL. A negative result does not preclude SARS-CoV-2 infection and should not be used as the sole basis for treatment or other patient management decisions.  A negative result may occur with improper specimen collection / handling, submission of  specimen other than nasopharyngeal swab, presence of viral mutation(s) within the areas targeted by this assay, and inadequate number of viral copies (<250 copies / mL). A negative result must be combined with clinical observations, patient history, and epidemiological information.  Fact Sheet for Patients:   StrictlyIdeas.no  Fact Sheet for Healthcare Providers: BankingDealers.co.za  This test is not yet approved or  cleared by the [place] FDA and has been authorized for detection and/or diagnosis of SARS-CoV-2 by FDA under an Emergency Use Authorization (EUA).  This EUA will remain in effect (meaning this test can be used) for the duration of the COVID-19 declaration under Section 564(b)(1) of the Act, 21 U.S.C. section 360bbb-3(b)(1), unless the authorization is terminated or revoked sooner.  Performed at [facility], [address] [place], [place] [address]   Blood culture (routine single)     Status: Abnormal   Collection Time: 03/25/2020  1:39 PM   Specimen: BLOOD  Result Value Ref Range Status   Specimen Description   Final    BLOOD BLOOD RIGHT FOREARM Performed at [facility], [address], [place], [place] [address]    Special Requests   Final    BOTTLES DRAWN AEROBIC AND ANAEROBIC Blood Culture results may not be optimal due to an excessive volume of blood received in culture bottles Performed at [facility], [address] [place], [place] [address]    Culture  Setup Time   Final    GRAM NEGATIVE RODS ANAEROBIC BOTTLE ONLY CRITICAL RESULT CALLED TO, READ BACK BY AND VERIFIED WITH: DUSTIN ZEIGLER AT 4193 ON 03/14/2020 SNG    Culture (A)  Final    BACTEROIDES THETAIOTAOMICRON BETA LACTAMASE POSITIVE Performed at [facility], [place] [address], [place], [place] [address]    Report Status 03/18/2020 FINAL  Final  Blood Culture ID Panel (Reflexed)     Status: None   Collection  Time: 03/29/2020  1:39 PM  Result Value Ref Range Status   Enterococcus faecalis NOT DETECTED NOT DETECTED Final   Enterococcus Faecium NOT DETECTED NOT DETECTED Final   Listeria monocytogenes NOT DETECTED NOT DETECTED Final   Staphylococcus species NOT DETECTED NOT DETECTED Final   Staphylococcus aureus (BCID) NOT DETECTED NOT DETECTED Final   Staphylococcus epidermidis NOT DETECTED NOT DETECTED Final   Staphylococcus lugdunensis NOT DETECTED NOT DETECTED Final   Streptococcus species NOT DETECTED NOT DETECTED Final   Streptococcus agalactiae NOT DETECTED NOT DETECTED Final   Streptococcus pneumoniae NOT DETECTED NOT DETECTED Final   Streptococcus pyogenes NOT DETECTED NOT DETECTED Final   A.calcoaceticus-baumannii NOT DETECTED NOT DETECTED Final   Bacteroides fragilis NOT DETECTED NOT DETECTED Final   Enterobacterales NOT DETECTED NOT DETECTED Final   Enterobacter cloacae complex NOT DETECTED NOT DETECTED Final   Escherichia coli NOT DETECTED NOT DETECTED Final   Klebsiella aerogenes NOT DETECTED NOT DETECTED Final   Klebsiella oxytoca NOT DETECTED NOT DETECTED Final   Klebsiella pneumoniae NOT DETECTED NOT DETECTED Final   Proteus species NOT DETECTED NOT DETECTED Final   Salmonella species NOT DETECTED NOT DETECTED Final   Serratia marcescens NOT DETECTED NOT DETECTED Final   Haemophilus influenzae NOT DETECTED NOT DETECTED Final   Neisseria meningitidis NOT DETECTED NOT DETECTED Final   Pseudomonas  aeruginosa NOT DETECTED NOT DETECTED Final   Stenotrophomonas maltophilia NOT DETECTED NOT DETECTED Final   Candida albicans NOT DETECTED NOT DETECTED Final   Candida auris NOT DETECTED NOT DETECTED Final   Candida glabrata NOT DETECTED NOT DETECTED Final   Candida krusei NOT DETECTED NOT DETECTED Final   Candida parapsilosis NOT DETECTED NOT DETECTED Final   Candida tropicalis NOT DETECTED NOT DETECTED Final   Cryptococcus neoformans/gattii NOT DETECTED NOT DETECTED Final    Comment:  Performed at [facility], [address] [place], [place] [address]  Aerobic/Anaerobic Culture (surgical/deep wound)     Status: None   Collection Time: 04/10/2020  3:18 PM   Specimen: PATH Other; Body Fluid  Result Value Ref Range Status   Specimen Description   Final    PERITONEAL Performed at [facility], [address] [place], [place] [address]    Special Requests   Final    PERIOTTONEAL CAVITY FLUID Performed at [facility], [place], [place] [address]    Gram Stain   Final    MODERATE WBC PRESENT, PREDOMINANTLY PMN ABUNDANT GRAM NEGATIVE RODS FEW GRAM POSITIVE COCCI RARE GRAM POSITIVE RODS Performed at [facility], [place] [address] [place], [place] [address]    Culture   Final    FEW KLEBSIELLA ORNITHINOLYTICA FEW ESCHERICHIA COLI MIXED ANAEROBIC FLORA PRESENT.  CALL LAB IF FURTHER IID REQUIRED.    Report Status 03/18/2020 FINAL  Final   Organism ID, Bacteria KLEBSIELLA ORNITHINOLYTICA  Final   Organism ID, Bacteria ESCHERICHIA COLI  Final      Susceptibility   Escherichia coli - MIC*    AMPICILLIN 8 SENSITIVE Sensitive     CEFAZOLIN <=4 SENSITIVE Sensitive     CEFEPIME <=0.12 SENSITIVE Sensitive     CEFTAZIDIME <=1 SENSITIVE Sensitive     CEFTRIAXONE <=0.25 SENSITIVE Sensitive     CIPROFLOXACIN <=0.25 SENSITIVE Sensitive     GENTAMICIN <=1 SENSITIVE Sensitive     IMIPENEM <=0.25 SENSITIVE Sensitive     TRIMETH/SULFA <=20 SENSITIVE Sensitive     AMPICILLIN/SULBACTAM 4 SENSITIVE Sensitive     PIP/TAZO <=4 SENSITIVE Sensitive     * FEW ESCHERICHIA COLI   Klebsiella ornithinolytica - MIC*    AMPICILLIN >=32 RESISTANT Resistant     CEFAZOLIN <=4 SENSITIVE Sensitive     CEFEPIME <=0.12 SENSITIVE Sensitive     CEFTAZIDIME <=1 SENSITIVE Sensitive     CEFTRIAXONE <=0.25 SENSITIVE Sensitive     CIPROFLOXACIN <=0.25 SENSITIVE Sensitive     GENTAMICIN <=1 SENSITIVE Sensitive     IMIPENEM 1 SENSITIVE Sensitive      TRIMETH/SULFA <=20 SENSITIVE Sensitive     AMPICILLIN/SULBACTAM 4 SENSITIVE Sensitive     PIP/TAZO <=4 SENSITIVE Sensitive     * FEW KLEBSIELLA ORNITHINOLYTICA  MRSA PCR Screening     Status: None   Collection Time: 03/22/2020  6:21 PM   Specimen: Nasopharyngeal  Result Value Ref Range Status   MRSA by PCR NEGATIVE NEGATIVE Final    Comment:        The GeneXpert MRSA Assay (FDA approved for NASAL specimens only), is one component of a comprehensive MRSA colonization surveillance program. It is not intended to diagnose MRSA infection nor to guide or monitor treatment for MRSA infections. Performed at [facility], [address] [place], [place] [address]           IMAGING    DG Abd 1 View  Result Date: 03/18/2020 CLINICAL DATA:  OG tube placement EXAM: ABDOMEN - 1 VIEW COMPARISON:  None. FINDINGS: OG tube appears adequately positioned in the stomach. Presumed drainage catheters within the LEFT and RIGHT abdomen. Overall nonobstructive bowel gas pattern. IMPRESSION: OG tube adequately positioned in the stomach. Electronically Signed   By: Franki Cabot M.D.   On: 03/18/2020 18:45     Nutrition Status: Nutrition Problem: Inadequate oral intake Etiology: inability to eat Signs/Symptoms: NPO status Interventions: Refer to RD note for recommendations   BMP Latest Ref Rng & Units 03/19/2020 03/18/2020 03/17/2020  Glucose 70 - 99 mg/dL 196(H) 169(H) 131(H)  BUN 8 - 23 mg/dL 41(H) 41(H) 37(H)  Creatinine 0.61 - 1.24 mg/dL 1.24 1.52(H) 1.78(H)  Sodium 135 - 145 mmol/L 154(H) 149(H) 145  Potassium 3.5 - 5.1 mmol/L 4.4 4.7 4.9  Chloride 98 - 111 mmol/L 120(H) 115(H) 116(H)  CO2 22 - 32 mmol/L 24 24 23   Calcium 8.9 - 10.3 mg/dL 7.5(L) 7.1(L) 6.5(L)     Indwelling Urinary Catheter continued, requirement due to   Reason to continue Indwelling Urinary Catheter strict Intake/Output monitoring for hemodynamic instability   Central Line/ continued, requirement due to  Reason  to continue [place] of central venous pressure or other hemodynamic parameters and poor IV access   Ventilator continued, requirement due to severe respiratory failure   Ventilator Sedation RASS 0 to -2      ASSESSMENT AND PLAN SYNOPSIS   Severe ACUTE Hypoxic and Hypercapnic Respiratory Failure due to severe septic shock from perforated viscus -continue Bronchodilator Therapy -Wean supplemental oxygen as tolerated   ACUTE KIDNEY INJURY/Renal Failure -continue Foley Catheter-assess need -Avoid nephrotoxic agents -Follow urine output, BMP -Ensure adequate renal perfusion, optimize oxygenation -Renal dose medications   NEUROLOGY - intubated and sedated - minimal sedation to achieve a RASS goal: -1 Wake up assessment pending  Acute toxic metabolic encephalopathy, need for sedation Goal RASS -2 to -3  SHOCK-SEPSIS -use vasopressors to keep MAP>65 -follow ABG and LA -follow up cultures -emperic ABX -consider stress dose steroids -aggressive IV fluid resuscitation  CARDIAC ICU monitoring  ID -continue IV abx as prescibed -follow up cultures  GI GI PROPHYLAXIS as indicated  NUTRITIONAL STATUS Nutrition Status: Nutrition Problem: Inadequate oral intake Etiology: inability to eat Signs/Symptoms: NPO status Interventions: Refer to RD note for recommendations   DIET-->TF's as tolerated Constipation protocol as indicated  ENDO - will use ICU hypoglycemic\Hyperglycemia protocol if indicated     ELECTROLYTES -follow labs as needed -replace as needed -pharmacy consultation and following   DVT/GI PRX ordered and assessed TRANSFUSIONS AS NEEDED MONITOR FSBS I Assessed the need for Labs I Assessed the need for Foley I Assessed the need for Central Venous Line Family Discussion when available I Assessed the need for Mobilization I made an Assessment of medications to be adjusted accordingly Safety Risk assessment completed   CASE  DISCUSSED IN MULTIDISCIPLINARY ROUNDS WITH ICU TEAM  Critical Care Time devoted to patient care services described in this note is 34 minutes.   Overall, patient is critically ill, prognosis is guarded.  Patient with Multiorgan failure and at high risk for cardiac arrest and death.   PROGNOSIS IS POOR  Jorje Guild, M.D., M.P.H.  Pulmonary & Critical Care Medicine

## 2020-03-19 NOTE — Progress Notes (Signed)
[place] [facility] Hospital Day(s): 5.   Post op day(s): 3 Days Post-Op.   Interval History: Patient seen and examined, no acute events or new complaints overnight. Patient reports feeling okay.  He was not able to give much history or any more complaints.  He only respond to questions with okay.  He does accept that he does not remember why he is here.  Currently denies any abdominal pain.  Cannot express any complaint at this moment.  Patient was able to be extubated yesterday and tolerating adequately.  Vital signs in last 24 hours: [min-max] current  Temp:  [97.8 F (36.6 C)-99.3 F (37.4 C)] 97.8 F (36.6 C) (09/06 0800) Pulse Rate:  [32-104] 32 (09/06 0600) Resp:  [11-23] 14 (09/06 0600) BP: (100-136)/(58-87) 136/73 (09/06 0600) SpO2:  [90 %-100 %] 94 % (09/06 0600) Arterial Line BP: (105-157)/(52-80) 149/80 (09/06 0600) FiO2 (%):  [28 %] 28 % (09/05 1513)     Height: 5' 7.99" (172.7 cm) Weight: 86.6 kg BMI (Calculated): 29.05   Physical Exam:  Constitutional: alert, cooperative and no distress  Respiratory: breathing non-labored at rest  Cardiovascular: regular rate and sinus rhythm  Gastrointestinal: soft, non-tender, and non-distended.  Right lower quadrant ileostomy patent  Labs:  CBC Latest Ref Rng & Units 03/19/2020 03/18/2020 03/17/2020  WBC 4.0 - 10.5 K/uL 12.0(H) 9.7 13.2(H)  Hemoglobin 13.0 - 17.0 g/dL 11.2(L) 10.7(L) 10.5(L)  Hematocrit 39 - 52 % 36.1(L) 34.1(L) 32.0(L)  Platelets 150 - 400 K/uL 94(L) 96(L) 123(L)   CMP Latest Ref Rng & Units 03/19/2020 03/18/2020 03/17/2020  Glucose 70 - 99 mg/dL 196(H) 169(H) 131(H)  BUN 8 - 23 mg/dL 41(H) 41(H) 37(H)  Creatinine 0.61 - 1.24 mg/dL 1.24 1.52(H) 1.78(H)  Sodium 135 - 145 mmol/L 154(H) 149(H) 145  Potassium 3.5 - 5.1 mmol/L 4.4 4.7 4.9  Chloride 98 - 111 mmol/L 120(H) 115(H) 116(H)  CO2 22 - 32 mmol/L 24 24 23   Calcium 8.9 - 10.3 mg/dL 7.5(L) 7.1(L) 6.5(L)  Total Protein 6.5 - 8.1 g/dL - - -  Total Bilirubin  0.3 - 1.2 mg/dL - - -  Alkaline Phos 38 - 126 U/L - - -  AST 15 - 41 U/L - - -  ALT 0 - 44 U/L - - -    Imaging studies: No new pertinent imaging studies   Assessment/Plan:  64 y.o.malewith descending colon obstructive mass with cecum perforation5Day Post-Ops/p subtotal colectomyand interval reopening of recent laparotomy with drainage of intra-abdominal abscess and ileostomy creation postoperative day #3, complicated by pertinent comorbidities including, respiratory failure mechanical ventilation due to hypoxia,septic shock, acute kidney injury, diabetes, hypertension, and newly diagnosed malignant neoplasm of the splenic flexure.  Patient recovering slowly but adequately.  He was able to be extubated yesterday.  He was able to be weaned off pressors yesterday as well.  This is a great improvement in his clinical status.  This means that the infection is slowly getting under control.  Patient still with persistent postoperative ileus which is expected after the big surgery and severe intra-abdominal infection.  Patient will need to stay with nasogastric tube in place.  Agree with decreasing IV fluids.  If patient continue with ileus tomorrow we will consider starting on TPN.  Appreciate ICU team for the management of critical care condition.  Arnold Long, MD

## 2020-03-19 NOTE — Progress Notes (Signed)
°   03/19/20 1245  Clinical Encounter Type  Visited With Patient  Visit Type Follow-up  Referral From Chaplain  Consult/Referral To Chaplain  [place] rounding, chaplain stopped in to check on pt and found him awake. Chaplain told pt that she has visited with him several times in the past and is glad to see him awake. Chaplain made the visit brief.

## 2020-03-19 NOTE — Evaluation (Signed)
Physical Therapy Evaluation Patient Details Name: Donald Stephenson. MRN: [id number] DOB: Jun 16, 1956 Today's Date: 03/19/2020   History of Present Illness  Per MD notes: Pt is a 64 yo male who came in for abdominal pain and lethargy, found to be in shock and had CXR with air under the diaphragm. CT abd with bowel perforation, patient taken to OR emergently found to have 700cc mucopurulent material now s/p surgery with resection and post operative septic shock.  MD assessement includes: Severe ACUTE Hypoxic and Hypercapnic Respiratory Failure due to severe septic shock from perforated viscus, AKI/renal failure, intubated initially now extubated, and acute toxic metabolic encephalopathy.    Clinical Impression  Pt in no apparent distress upon entering room but was unable to answer any orientation questions.  Pt typically responded to the majority of questions or commands with "okay" but rarely was able to follow any commands.  Pt did occasionally offer some assistance with BLE therex but most was PROM.  Pt required +2 total assist with rolling and sidelying to/from sit and once in sitting at the EOB pt required total assist to prevent LOB.  Pt will benefit from PT services in a SNF setting upon discharge to safely address deficits listed in patient problem list for decreased caregiver assistance and eventual return to PLOF.        Follow Up Recommendations SNF    Equipment Recommendations  Other (comment) (TBD at next venue of care)    Recommendations for Other Services       Precautions / Restrictions Precautions Precautions: Fall Restrictions Weight Bearing Restrictions: No Other Position/Activity Restrictions: Colostomy and JP drain RLQ, JP drain LLQ      Mobility  Bed Mobility Overal bed mobility: Needs Assistance Bed Mobility: Rolling;Sidelying to Sit;Sit to Sidelying Rolling: +2 for physical assistance;Total assist Sidelying to sit: +2 for physical assistance;Total assist      Sit to sidelying: +2 for physical assistance;Total assist General bed mobility comments: No assist from patient provided with bed mobility tasks with pt total assist to maintain static sitting balance at the EOB  Transfers                 General transfer comment: Unable/unsafe to attempt  Ambulation/Gait                Stairs            Wheelchair Mobility    Modified Rankin (Stroke Patients Only)       Balance Overall balance assessment: Needs assistance Sitting-balance support: Bilateral upper extremity supported Sitting balance-Leahy Scale: Zero         Standing balance comment: Unable/unsafe to attempt                             Pertinent Vitals/Pain Pain Assessment: Faces Faces Pain Scale: Hurts a little bit Pain Location: Unable to assess Pain Descriptors / Indicators: Grimacing Pain Intervention(s): Monitored during session;Repositioned    Home Living                   Additional Comments: Unable to assess secondary to pt cognition, no caregiver available to assist.    Prior Function                 Hand Dominance        Extremity/Trunk Assessment   Upper Extremity Assessment Upper Extremity Assessment: Generalized weakness    Lower Extremity Assessment Lower Extremity Assessment: Generalized weakness  Communication      Cognition Arousal/Alertness: Lethargic Behavior During Therapy: Flat affect Overall Cognitive Status: No family/caregiver present to determine baseline cognitive functioning                                 General Comments: Pt A&O x 0, mostly replies "Okay" to any question or command      General Comments      Exercises Other Exercises Other Exercises: BLE ankle, knee, and hip AA/PROM with max verbal and tactile cues for patient participation   Assessment/Plan    PT Assessment Patient needs continued PT services  PT Problem List Decreased  strength;Decreased activity tolerance;Decreased mobility;Decreased balance;Decreased knowledge of use of DME;Decreased safety awareness;Decreased cognition       PT Treatment Interventions DME instruction;Gait training;Stair training;Functional mobility training;Therapeutic activities;Therapeutic exercise;Balance training;Patient/family education    PT Goals (Current goals can be found in the Care Plan section)  Acute Rehab PT Goals PT Goal Formulation: Patient unable to participate in goal setting Time For Goal Achievement: 04/01/20 Potential to Achieve Goals: Fair    Frequency Min 2X/week   Barriers to discharge Inaccessible home environment;Decreased caregiver support      Co-evaluation               AM-PAC PT "6 Clicks" Mobility  Outcome Measure Help needed turning from your back to your side while in a flat bed without using bedrails?: Total Help needed moving from lying on your back to sitting on the side of a flat bed without using bedrails?: Total Help needed moving to and from a bed to a chair (including a wheelchair)?: Total Help needed standing up from a chair using your arms (e.g., wheelchair or bedside chair)?: Total Help needed to walk in hospital room?: Total Help needed climbing 3-5 steps with a railing? : Total 6 Click Score: 6    End of Session Equipment Utilized During Treatment: Oxygen Activity Tolerance: Patient tolerated treatment well Patient left: in bed;with call bell/phone within reach Nurse Communication: Mobility status;Other (comment) (SpO2 respone to sitting at EOB per above) PT Visit Diagnosis: Muscle weakness (generalized) (M62.81);Difficulty in walking, not elsewhere classified (R26.2)    Time:  -      Charges:   PT Evaluation $PT Eval Moderate Complexity: 1 Mod PT Treatments $Therapeutic Exercise: 8-22 mins       D. Royetta Asal PT, DPT 03/19/20, 4:35 PM

## 2020-03-20 DIAGNOSIS — L8990 Pressure ulcer of unspecified site, unspecified stage: Secondary | ICD-10-CM | POA: Insufficient documentation

## 2020-03-20 LAB — CBC WITH DIFFERENTIAL/PLATELET
Abs Immature Granulocytes: 0.21 10*3/uL — ABNORMAL HIGH (ref 0.00–0.07)
Basophils Absolute: 0 10*3/uL (ref 0.0–0.1)
Basophils Relative: 0 %
Eosinophils Absolute: 0 10*3/uL (ref 0.0–0.5)
Eosinophils Relative: 0 %
HCT: 38.9 % — ABNORMAL LOW (ref 39.0–52.0)
Hemoglobin: 12.4 g/dL — ABNORMAL LOW (ref 13.0–17.0)
Immature Granulocytes: 2 %
Lymphocytes Relative: 8 %
Lymphs Abs: 1.1 10*3/uL (ref 0.7–4.0)
MCH: 29.5 pg (ref 26.0–34.0)
MCHC: 31.9 g/dL (ref 30.0–36.0)
MCV: 92.6 fL (ref 80.0–100.0)
Monocytes Absolute: 0.7 10*3/uL (ref 0.1–1.0)
Monocytes Relative: 5 %
Neutro Abs: 12.4 10*3/uL — ABNORMAL HIGH (ref 1.7–7.7)
Neutrophils Relative %: 85 %
Platelets: 125 10*3/uL — ABNORMAL LOW (ref 150–400)
RBC: 4.2 MIL/uL — ABNORMAL LOW (ref 4.22–5.81)
RDW: 14.1 % (ref 11.5–15.5)
Smear Review: NORMAL
WBC: 14.4 10*3/uL — ABNORMAL HIGH (ref 4.0–10.5)
nRBC: 0.1 % (ref 0.0–0.2)

## 2020-03-20 LAB — RENAL FUNCTION PANEL
Albumin: 2 g/dL — ABNORMAL LOW (ref 3.5–5.0)
Anion gap: 10 (ref 5–15)
BUN: 39 mg/dL — ABNORMAL HIGH (ref 8–23)
CO2: 28 mmol/L (ref 22–32)
Calcium: 7.5 mg/dL — ABNORMAL LOW (ref 8.9–10.3)
Chloride: 116 mmol/L — ABNORMAL HIGH (ref 98–111)
Creatinine, Ser: 1.09 mg/dL (ref 0.61–1.24)
GFR calc Af Amer: >60 mL/min (ref >60)
GFR calc non Af Amer: >60 mL/min (ref >60)
Glucose, Bld: 189 mg/dL — ABNORMAL HIGH (ref 70–99)
Phosphorus: 2.1 mg/dL — ABNORMAL LOW (ref 2.5–4.6)
Potassium: 3.7 mmol/L (ref 3.5–5.1)
Sodium: 154 mmol/L — ABNORMAL HIGH (ref 135–145)

## 2020-03-20 LAB — GLUCOSE, CAPILLARY
Glucose-Capillary: 166 mg/dL — ABNORMAL HIGH (ref 70–99)
Glucose-Capillary: 220 mg/dL — ABNORMAL HIGH (ref 70–99)
Glucose-Capillary: 222 mg/dL — ABNORMAL HIGH (ref 70–99)
Glucose-Capillary: 226 mg/dL — ABNORMAL HIGH (ref 70–99)
Glucose-Capillary: 241 mg/dL — ABNORMAL HIGH (ref 70–99)
Glucose-Capillary: 300 mg/dL — ABNORMAL HIGH (ref 70–99)

## 2020-03-20 LAB — CEA: CEA: 6.1 ng/mL — ABNORMAL HIGH (ref 0.0–4.7)

## 2020-03-20 LAB — MAGNESIUM: Magnesium: 2.3 mg/dL (ref 1.7–2.4)

## 2020-03-20 LAB — SURGICAL PATHOLOGY

## 2020-03-20 MED ORDER — SODIUM CHLORIDE 0.9 % IV SOLN
2.0000 g | INTRAVENOUS | Status: DC
  Administered 2020-03-20 – 2020-03-25 (×6): 2 g via INTRAVENOUS
  Filled 2020-03-20: qty 20
  Filled 2020-03-20: qty 2
  Filled 2020-03-20 (×2): qty 20
  Filled 2020-03-20 (×2): qty 2
  Filled 2020-03-20: qty 20

## 2020-03-20 MED ORDER — HYDROCORTISONE NA SUCCINATE PF 100 MG IJ SOLR
50.0000 mg | Freq: Two times a day (BID) | INTRAMUSCULAR | Status: DC
  Administered 2020-03-20 – 2020-03-21 (×2): 50 mg via INTRAVENOUS
  Filled 2020-03-20: qty 1
  Filled 2020-03-20: qty 2

## 2020-03-20 MED ORDER — INSULIN GLARGINE 100 UNIT/ML ~~LOC~~ SOLN
12.0000 [IU] | Freq: Every day | SUBCUTANEOUS | Status: DC
  Administered 2020-03-20: 12 [IU] via SUBCUTANEOUS
  Filled 2020-03-20 (×2): qty 0.12

## 2020-03-20 MED ORDER — INSULIN ASPART 100 UNIT/ML ~~LOC~~ SOLN
0.0000 [IU] | SUBCUTANEOUS | Status: DC
  Administered 2020-03-20 (×2): 5 [IU] via SUBCUTANEOUS
  Administered 2020-03-21: 8 [IU] via SUBCUTANEOUS
  Administered 2020-03-21 (×2): 11 [IU] via SUBCUTANEOUS
  Filled 2020-03-20 (×5): qty 1

## 2020-03-20 MED ORDER — SODIUM CHLORIDE 0.45 % IV SOLN: INTRAVENOUS | Status: DC

## 2020-03-20 MED ORDER — METRONIDAZOLE IN NACL 5-0.79 MG/ML-% IV SOLN
500.0000 mg | Freq: Three times a day (TID) | INTRAVENOUS | Status: DC
  Administered 2020-03-20 – 2020-03-25 (×17): 500 mg via INTRAVENOUS
  Filled 2020-03-20 (×20): qty 100

## 2020-03-20 MED ORDER — ENOXAPARIN SODIUM 40 MG/0.4ML ~~LOC~~ SOLN
40.0000 mg | SUBCUTANEOUS | Status: DC
  Administered 2020-03-20 – 2020-03-25 (×6): 40 mg via SUBCUTANEOUS
  Filled 2020-03-20 (×6): qty 0.4

## 2020-03-20 MED ORDER — INSULIN ASPART 100 UNIT/ML ~~LOC~~ SOLN: 0.0000 [IU] | Freq: Every day | SUBCUTANEOUS | Status: DC

## 2020-03-20 MED ORDER — INSULIN ASPART 100 UNIT/ML ~~LOC~~ SOLN
0.0000 [IU] | Freq: Three times a day (TID) | SUBCUTANEOUS | Status: DC
  Administered 2020-03-20: 5 [IU] via SUBCUTANEOUS
  Filled 2020-03-20: qty 1

## 2020-03-20 MED ORDER — DEXTROSE 5 % IV SOLN: INTRAVENOUS | Status: DC

## 2020-03-20 MED ORDER — TRAVASOL 10 % IV SOLN
INTRAVENOUS | Status: AC
  Filled 2020-03-20: qty 420

## 2020-03-20 NOTE — Progress Notes (Signed)
[facility] PROGRESS NOTE    Donald Stephenson.  HQP:[id number] DOB: Feb 10, 1956 DOA: 04/12/2020 PCP: Donnamarie Rossetti, PA-C      Brief Narrative:  Donald Stephenson is a 64 y.o. M with diabetes, hypertension who presented with abdominal pain.    In the ER, found to have air under diaphragm, taken emergently to OR for ex-lap, found to have perforated viscus, underwent colectomy, returned to ICU intubated and with open abdomen.  Now closed, extubated 9/5.  Colectomy evident for metastatic adenoCA.  Oncology consulted.          Assessment & Plan:  Septic shock due to perforated colon Acute respiratory failrue with hypoxia due to septic shock S/p ex-lap, subtotal colectomy and negative pressure dressing by Dr. Peyton Najjar 9/1 S/p small bowel resection, replaced negative pressure dressing, I&D, and ileostomy creation by Dr. Peyton Najjar 9/3 Patient presented with tachycardia, tachypnea, AMS, and lactate 7 mmol/L, found to have free air in abdomen, taken for emergent ex-lap.  Taken back to ICU intubated with negative pressure dressing.  Repeat ex-lap 9/3 with ileostomy creation.  Extubated 9/6  Bacterial cultures growing only Bacteroides and sensitive GNRs.  -Given severity of infection, will continue anidulafungin 7 total days empirically then stop  -Narrow to Flagyl, ceftriaxone  -Continue Cortef, reduce dose today, titrate off in 3 days  -Consult General Surgery, appreciate cares -TPN per Surgery   Newly diagnosed metastatic adenoCA of colon -COnsult Oncology, appreciate cares   Diabetes type 2 with hyperglycemia without long term insulin Glucoses elevated -Start Lantus -Continue insulin, add HS  -Hold Jardiance, metformin, glimepiride  Hypertension BP soft -Continue metoprolol -Hold amlodipine, lisinopril  Pressure injury, stage II coccyx, not POA   Acute renal failure due to septic shock Resolved   GI protection -Continue  PPI  Hypernatremia -Start hypotonic fluids  Thrombocytopenia Mild, resolving, no bleeding        Disposition: Status is: Inpatient  Remains inpatient appropriate because:Inpatient level of care appropriate due to severity of illness   Dispo: The patient is from: Home              Anticipated d/c is to: SNF              Anticipated d/c date is: > 3 days              Patient currently is not medically stable to d/c.     Patient admitted with perforated colon.  Now s/p ex-lab and ostomy.  Started on TPN today.   Will need consensus from General Surgery about plans for future surgery, if none, then will progress to diet vs. TPN and LTAC.         MDM: The below labs and imaging reports were reviewed and summarized above.  Medication management as above.    DVT prophylaxis: enoxaparin (LOVENOX) injection 40 mg Start: 03/20/20 2200 SCDs Start: 03/20/2020 1427  Code Status: FULL Family Communication: wife at bedside, daughter by phone    Consultants:   Gen Surg  PCCM  Procedures:   9/1 Ex-lap  9/1 central line placement, intubation  9/3 repeat ex-lap and ostomy creation  9/5 extubation  Antimicrobials:   Zosyn 9/1 >> 9.3  Meropenem 9/3 >>  Anidulafungin 9/3 >>   Culture data:   9/1 blood culture x1 -- bacteroides  9/1 abscess culture -- E coli, Klebsiella            Subjective: Patient awake but subdued.  Appears to be in mild abdominal  pain.  No fever.  Still sedated an confused.  Objective: Vitals:   03/20/20 1500 03/20/20 1600 03/20/20 1700 03/20/20 1800  BP: (!) 168/87 (!) 171/95 (!) 165/88 (!) 186/74  Pulse:  67    Resp:      Temp:      TempSrc:      SpO2:  93%    Weight:      Height:        Intake/Output Summary (Last 24 hours) at 03/20/2020 2119 Last data filed at 03/20/2020 1539 Gross per 24 hour  Intake 1459.37 ml  Output 4370 ml  Net -2910.63 ml   Filed Weights   03/21/2020 1229  Weight: 86.6 kg     Examination: General appearance:  adult male, sleepy opens eyes, back to sleep and in no obvious distress.   HEENT: Anicteric, conjunctiva pink, lids and lashes normal. No nasal deformity, discharge, epistaxis.  Lips dry, edentulous, op very dry and crusted, no oral lesions, hearing diminished.   Skin: Warm and dry.  no jaundice.  No suspicious rashes or lesions. Cardiac: RRR, nl S1-S2, no murmurs appreciated.  Capillary refill is brisk.  JVPnot visible.  Arm edema severe.  Radial pulses 2+ and symmetric. Respiratory: Normal respiratory rate and rhythm.  CTAB without rales or wheezes. Abdomen: Abdomen soft.  Wound with wound vac, apperas clear.  Modreate diffuse tenderness.    MSK: No deformities or effusions. Neuro: Awake and sleepy.  Severe generalized weakness, unable to lift arms aginst gravity.  Speech slurred, slow, halting.  Psych:     Data Reviewed: I have personally reviewed following labs and imaging studies:  CBC: Recent Labs  Lab 03/22/2020 0400 03/17/20 0401 03/18/20 0500 03/19/20 0445 03/20/20 0606  WBC 17.1* 13.2* 9.7 12.0* 14.4*  NEUTROABS 15.4* 11.0* 8.0* 10.2* 12.4*  HGB 10.9* 10.5* 10.7* 11.2* 12.4*  HCT 34.3* 32.0* 34.1* 36.1* 38.9*  MCV 93.0 90.9 94.2 93.8 92.6  PLT 156 123* 96* 94* 119*   Basic Metabolic Panel: Recent Labs  Lab 04/05/2020 0400 03/17/20 0401 03/17/20 0500 03/18/20 0500 03/19/20 0445 03/20/20 0606  NA 145  --  145 149* 154* 154*  K 5.0  --  4.9 4.7 4.4 3.7  CL 113*  --  116* 115* 120* 116*  CO2 23  --  23 24 24 28   GLUCOSE 135*  --  131* 169* 196* 189*  BUN 34*  --  37* 41* 41* 39*  CREATININE 2.16*  --  1.78* 1.52* 1.24 1.09  CALCIUM 6.6*  --  6.5* 7.1* 7.5* 7.5*  MG 2.0 2.2  --  2.5* 2.5* 2.3  PHOS 3.9  --  3.4 2.5 2.4* 2.1*   GFR: Estimated Creatinine Clearance: 71.4 mL/min (by C-G formula based on SCr of 1.09 mg/dL). Liver Function Tests: Recent Labs  Lab 03/25/2020 1226 03/15/20 0419 04/10/2020 0400 03/17/20 0500  03/18/20 0500 03/19/20 0445 03/20/20 0606  AST 27  --  113*  --   --   --   --   ALT 12  --  83*  --   --   --   --   ALKPHOS 57  --  61  --   --   --   --   BILITOT 1.1  --  0.5  --   --   --   --   PROT 6.2*  --  5.0*  --   --   --   --   ALBUMIN 3.0*   < > 2.0*  2.1* 2.0* 2.0* 2.0* 2.0*   < > = values in this interval not displayed.   Recent Labs  Lab 03/15/2020 1226  LIPASE 35   No results for input(s): AMMONIA in the last 168 hours. Coagulation Profile: Recent Labs  Lab 04/10/2020 1226  INR 1.1   Cardiac Enzymes: No results for input(s): CKTOTAL, CKMB, CKMBINDEX, TROPONINI in the last 168 hours. BNP (last 3 results) No results for input(s): PROBNP in the last 8760 hours. HbA1C: No results for input(s): HGBA1C in the last 72 hours. CBG: Recent Labs  Lab 03/20/20 0355 03/20/20 0715 03/20/20 1130 03/20/20 1608 03/20/20 1928  GLUCAP 220* 166* 222* 226* 241*   Lipid Profile: No results for input(s): CHOL, HDL, LDLCALC, TRIG, CHOLHDL, LDLDIRECT in the last 72 hours. Thyroid Function Tests: No results for input(s): TSH, T4TOTAL, FREET4, T3FREE, THYROIDAB in the last 72 hours. Anemia Panel: No results for input(s): VITAMINB12, FOLATE, FERRITIN, TIBC, IRON, RETICCTPCT in the last 72 hours. Urine analysis:    Component Value Date/Time   COLORURINE YELLOW (A) 04/11/2019 1143   APPEARANCEUR CLOUDY (A) 04/11/2019 1143   LABSPEC 1.029 04/11/2019 1143   PHURINE 5.0 04/11/2019 1143   GLUCOSEU NEGATIVE 04/11/2019 1143   HGBUR NEGATIVE 04/11/2019 1143   BILIRUBINUR NEGATIVE 04/11/2019 1143   KETONESUR NEGATIVE 04/11/2019 1143   PROTEINUR NEGATIVE 04/11/2019 1143   NITRITE NEGATIVE 04/11/2019 1143   LEUKOCYTESUR NEGATIVE 04/11/2019 1143   Sepsis Labs: @LABRCNTIP (procalcitonin:4,lacticacidven:4)  ) Recent Results (from the past 240 hour(s))  SARS Coronavirus 2 by RT PCR (hospital order, performed in [facility] hospital lab) Nasopharyngeal Nasopharyngeal Swab      Status: None   Collection Time: 04/10/2020 12:26 PM   Specimen: Nasopharyngeal Swab  Result Value Ref Range Status   SARS Coronavirus 2 NEGATIVE NEGATIVE Final    Comment: (NOTE) SARS-CoV-2 target nucleic acids are NOT DETECTED.  The SARS-CoV-2 RNA is generally detectable in upper and lower respiratory specimens during the acute phase of infection. The lowest concentration of SARS-CoV-2 viral copies this assay can detect is 250 copies / mL. A negative result does not preclude SARS-CoV-2 infection and should not be used as the sole basis for treatment or other patient management decisions.  A negative result may occur with improper specimen collection / handling, submission of specimen other than nasopharyngeal swab, presence of viral mutation(s) within the areas targeted by this assay, and inadequate number of viral copies (<250 copies / mL). A negative result must be combined with clinical observations, patient history, and epidemiological information.  Fact Sheet for Patients:   StrictlyIdeas.no  Fact Sheet for Healthcare Providers: BankingDealers.co.za  This test is not yet approved or  cleared by the [place] FDA and has been authorized for detection and/or diagnosis of SARS-CoV-2 by FDA under an Emergency Use Authorization (EUA).  This EUA will remain in effect (meaning this test can be used) for the duration of the COVID-19 declaration under Section 564(b)(1) of the Act, 21 U.S.C. section 360bbb-3(b)(1), unless the authorization is terminated or revoked sooner.  Performed at [facility], [address] [place], [place] [address]   Blood culture (routine single)     Status: Abnormal   Collection Time: 04/05/2020  1:39 PM   Specimen: BLOOD  Result Value Ref Range Status   Specimen Description   Final    BLOOD BLOOD RIGHT FOREARM Performed at [facility], [address] [place], [place] [address]     Special Requests   Final  BOTTLES DRAWN AEROBIC AND ANAEROBIC Blood Culture results may not be optimal due to an excessive volume of blood received in culture bottles Performed at [facility], [address] [place], [place] [address]    Culture  Setup Time   Final    GRAM NEGATIVE RODS ANAEROBIC BOTTLE ONLY CRITICAL RESULT CALLED TO, READ BACK BY AND VERIFIED WITH: DUSTIN ZEIGLER AT 1025 ON 03/22/2020 SNG    Culture (A)  Final    BACTEROIDES THETAIOTAOMICRON BETA LACTAMASE POSITIVE Performed at [facility], [place] [address], [place], [place] [address]    Report Status 03/18/2020 FINAL  Final  Blood Culture ID Panel (Reflexed)     Status: None   Collection Time: 03/29/2020  1:39 PM  Result Value Ref Range Status   Enterococcus faecalis NOT DETECTED NOT DETECTED Final   Enterococcus Faecium NOT DETECTED NOT DETECTED Final   Listeria monocytogenes NOT DETECTED NOT DETECTED Final   Staphylococcus species NOT DETECTED NOT DETECTED Final   Staphylococcus aureus (BCID) NOT DETECTED NOT DETECTED Final   Staphylococcus epidermidis NOT DETECTED NOT DETECTED Final   Staphylococcus lugdunensis NOT DETECTED NOT DETECTED Final   Streptococcus species NOT DETECTED NOT DETECTED Final   Streptococcus agalactiae NOT DETECTED NOT DETECTED Final   Streptococcus pneumoniae NOT DETECTED NOT DETECTED Final   Streptococcus pyogenes NOT DETECTED NOT DETECTED Final   A.calcoaceticus-baumannii NOT DETECTED NOT DETECTED Final   Bacteroides fragilis NOT DETECTED NOT DETECTED Final   Enterobacterales NOT DETECTED NOT DETECTED Final   Enterobacter cloacae complex NOT DETECTED NOT DETECTED Final   Escherichia coli NOT DETECTED NOT DETECTED Final   Klebsiella aerogenes NOT DETECTED NOT DETECTED Final   Klebsiella oxytoca NOT DETECTED NOT DETECTED Final   Klebsiella pneumoniae NOT DETECTED NOT DETECTED Final   Proteus species NOT DETECTED NOT DETECTED Final   Salmonella species NOT DETECTED NOT  DETECTED Final   Serratia marcescens NOT DETECTED NOT DETECTED Final   Haemophilus influenzae NOT DETECTED NOT DETECTED Final   Neisseria meningitidis NOT DETECTED NOT DETECTED Final   Pseudomonas aeruginosa NOT DETECTED NOT DETECTED Final   Stenotrophomonas maltophilia NOT DETECTED NOT DETECTED Final   Candida albicans NOT DETECTED NOT DETECTED Final   Candida auris NOT DETECTED NOT DETECTED Final   Candida glabrata NOT DETECTED NOT DETECTED Final   Candida krusei NOT DETECTED NOT DETECTED Final   Candida parapsilosis NOT DETECTED NOT DETECTED Final   Candida tropicalis NOT DETECTED NOT DETECTED Final   Cryptococcus neoformans/gattii NOT DETECTED NOT DETECTED Final    Comment: Performed at [facility], [address] [place], [place] [address]  Aerobic/Anaerobic Culture (surgical/deep wound)     Status: None   Collection Time: 04/10/2020  3:18 PM   Specimen: PATH Other; Body Fluid  Result Value Ref Range Status   Specimen Description   Final    PERITONEAL Performed at [facility], [address], [place], [place] [address]    Special Requests   Final    PERIOTTONEAL CAVITY FLUID Performed at [facility], [place], [place] [address]    Gram Stain   Final    MODERATE WBC PRESENT, PREDOMINANTLY PMN ABUNDANT GRAM NEGATIVE RODS FEW GRAM POSITIVE COCCI RARE GRAM POSITIVE RODS Performed at [facility], [place] [address], [place], [place] [address]    Culture   Final    FEW KLEBSIELLA ORNITHINOLYTICA FEW ESCHERICHIA COLI MIXED ANAEROBIC FLORA PRESENT.  CALL LAB IF FURTHER IID REQUIRED.  Report Status 03/18/2020 FINAL  Final   Organism ID, Bacteria KLEBSIELLA ORNITHINOLYTICA  Final   Organism ID, Bacteria ESCHERICHIA COLI  Final      Susceptibility   Escherichia coli - MIC*    AMPICILLIN 8 SENSITIVE Sensitive     CEFAZOLIN <=4 SENSITIVE Sensitive     CEFEPIME <=0.12 SENSITIVE Sensitive     CEFTAZIDIME <=1 SENSITIVE Sensitive      CEFTRIAXONE <=0.25 SENSITIVE Sensitive     CIPROFLOXACIN <=0.25 SENSITIVE Sensitive     GENTAMICIN <=1 SENSITIVE Sensitive     IMIPENEM <=0.25 SENSITIVE Sensitive     TRIMETH/SULFA <=20 SENSITIVE Sensitive     AMPICILLIN/SULBACTAM 4 SENSITIVE Sensitive     PIP/TAZO <=4 SENSITIVE Sensitive     * FEW ESCHERICHIA COLI   Klebsiella ornithinolytica - MIC*    AMPICILLIN >=32 RESISTANT Resistant     CEFAZOLIN <=4 SENSITIVE Sensitive     CEFEPIME <=0.12 SENSITIVE Sensitive     CEFTAZIDIME <=1 SENSITIVE Sensitive     CEFTRIAXONE <=0.25 SENSITIVE Sensitive     CIPROFLOXACIN <=0.25 SENSITIVE Sensitive     GENTAMICIN <=1 SENSITIVE Sensitive     IMIPENEM 1 SENSITIVE Sensitive     TRIMETH/SULFA <=20 SENSITIVE Sensitive     AMPICILLIN/SULBACTAM 4 SENSITIVE Sensitive     PIP/TAZO <=4 SENSITIVE Sensitive     * FEW KLEBSIELLA ORNITHINOLYTICA  MRSA PCR Screening     Status: None   Collection Time: 04/11/2020  6:21 PM   Specimen: Nasopharyngeal  Result Value Ref Range Status   MRSA by PCR NEGATIVE NEGATIVE Final    Comment:        The GeneXpert MRSA Assay (FDA approved for NASAL specimens only), is one component of a comprehensive MRSA colonization surveillance program. It is not intended to diagnose MRSA infection nor to guide or monitor treatment for MRSA infections. Performed at [facility], [address] [place], [place] [address]          Radiology Studies: No results found.      Scheduled Meds: . chlorhexidine  15 mL Mouth Rinse BID  . Chlorhexidine Gluconate Cloth  6 each Topical Daily  . enoxaparin (LOVENOX) injection  40 mg Subcutaneous Q24H  . hydrocortisone sod succinate (SOLU-CORTEF) inj  50 mg Intravenous Q12H  . insulin aspart  0-15 Units Subcutaneous Q4H  . insulin glargine  12 Units Subcutaneous Daily  . mouth rinse  15 mL Mouth Rinse q12n4p  . metoprolol tartrate  12.5 mg Oral Daily  . pantoprazole (PROTONIX) IV  40 mg Intravenous QHS    Continuous Infusions: . albumin human Stopped (03/20/20 1058)  . anidulafungin Stopped (03/20/20 1451)  . cefTRIAXone (ROCEPHIN)  IV Stopped (03/20/20 1042)  . dextrose 75 mL/hr at 03/20/20 1539  . metronidazole 500 mg (03/20/20 1838)  . TPN ADULT (ION) 35 mL/hr at 03/20/20 1842     LOS: 6 days    Time spent: 35 minutes    Edwin Dada, MD [facility] 03/20/2020, 9:19 PM     Please page though [place] or Epic secure chat:  For [employer], Adult nurse

## 2020-03-20 NOTE — TOC Initial Note (Signed)
Transition of Care (TOC) - Initial/Assessment Note    Patient Details  Name: Donald Stephenson. MRN: [id number] Date of Birth: 06/24/1956  Transition of Care [facility]) CM/SW Contact:    Magnus Ivan, LCSW Phone Number: 03/20/2020, 1:43 PM  Clinical Narrative:         CSW met with patient, patient's wife [name]), and patient's daughter [name], via phone) for Readmission Screen. Patient was lethargic. Patient lives with wife. Patient drives himself to appointments. Wife reported patient does not have a PCP. [facility] [place]. Wife denied DME or [place] services. Informed wife and daughter of PT/OT recommendations for SNF at discharge. They were agreeable to this. CSW will send list of SNFs to daughter for her to review and send back her preferences. CSW will continue to follow.            Expected Discharge Plan: Skilled Nursing Facility Barriers to Discharge: Continued Medical Work up   Patient Goals and CMS Choice Patient states their goals for this hospitalization and ongoing recovery are:: family would like him to discharge to SNF for rehab CMS Medicare.gov Compare Post Acute Care list provided to:: Patient Represenative (must comment) (wife [name]) and daughter [name])) Choice offered to / list presented to : Spouse, Adult Children  Expected Discharge Plan and Services Expected Discharge Plan: [place] Acute Care Choice: [place] Living arrangements for the past 2 months: Single Family Home                                      Prior Living Arrangements/Services Living arrangements for the past 2 months: Single Family Home Lives with:: Spouse          Need for Family Participation in Patient Care: Yes (Comment) Care giver support system in place?: Yes (comment)   Criminal Activity/Legal Involvement Pertinent to Current Situation/Hospitalization: No - Comment as needed  Activities of Daily Living Home Assistive  Devices/Equipment: None ADL Screening (condition at time of admission) Patient's cognitive ability adequate to safely complete daily activities?: Yes Is the patient deaf or have difficulty hearing?: No Does the patient have difficulty seeing, even when wearing glasses/contacts?: No Does the patient have difficulty concentrating, remembering, or making decisions?: No Patient able to express need for assistance with ADLs?: Yes Does the patient have difficulty dressing or bathing?: No Independently performs ADLs?: Yes (appropriate for developmental age) Does the patient have difficulty walking or climbing stairs?: No Weakness of Legs: None Weakness of Arms/Hands: None  Permission Sought/Granted Permission sought to share information with : Facility Art therapist granted to share information with : Yes, Verbal Permission Granted     Permission granted to share info w AGENCY: SNFs        Emotional Assessment Appearance:: Appears stated age Attitude/Demeanor/Rapport: Lethargic     Alcohol / Substance Use: Not Applicable Psych Involvement: No (comment)  Admission diagnosis:  Bowel perforation ([place]) [K63.1] Patient Active Problem List   Diagnosis Date Noted  . Pressure injury of skin 03/20/2020  . Cancer of descending colon ([place]) 03/18/2020  . Bowel perforation ([place]) 04/02/2020  . Status post total hip replacement, left 04/19/2019   PCP:  Donnamarie Rossetti, PA-C Pharm[facility]   [id number] [place] [id number]: [phone]: [phone]    Social  Determinants of Health (SDOH) Interventions    Readmission Risk Interventions Readmission Risk Prevention Plan 03/20/2020  Transportation Screening Complete  PCP or Specialist Appt within 3-5 Days Complete  HRI or Home Care Consult Complete  Medication Review Press photographer) Complete

## 2020-03-20 NOTE — Evaluation (Signed)
Occupational Therapy Evaluation Patient Details Name: Donald Stephenson. MRN: [id number] DOB: 1956/03/25 Today's Date: 03/20/2020    History of Present Illness Per MD notes: Pt is a 64 yo male who came in for abdominal pain and lethargy, found to be in shock and had CXR with air under the diaphragm. CT abd with bowel perforation, patient taken to OR emergently found to have 700cc mucopurulent material now s/p surgery with resection and post operative septic shock.  MD assessement includes: Severe ACUTE Hypoxic and Hypercapnic Respiratory Failure due to severe septic shock from perforated viscus, AKI/renal failure, intubated initially now extubated, and acute toxic metabolic encephalopathy.   Clinical Impression   Patient presenting with decreased I in self care, balance, functional mobility/transfers, endurance, strength, and safety awareness. Patient is unreliable historian and no family present during this session. Pt reports he lives with wife, independent, and working full time PTA. Patient currently functioning at dependent level of total A of 2 people. Pt is unable to move B UEs against gravity and very lethargic throughout session. PROM to B UEs this session. He was able to squeeze B hands but not much. Hand over hand assistance to use suction toothbrush for oral care this session. Patient will benefit from acute OT to increase overall independence in the areas of ADLs, functional mobility,and safety awareness in order to safely discharge to next venue of care.    Follow Up Recommendations  SNF    Equipment Recommendations  Other (comment) (defer to next venue of care)    Recommendations for Other Services Other (comment)     Precautions / Restrictions Precautions Precautions: Fall Restrictions Other Position/Activity Restrictions: Colostomy and JP drain RLQ, JP drain LLQ      Mobility Bed Mobility Overal bed mobility: Needs Assistance Bed Mobility: Rolling Rolling: Total  assist    General bed mobility comments: dependent  Transfers       General transfer comment: Unable/unsafe to attempt        ADL either performed or assessed with clinical judgement   ADL Overall ADL's : Needs assistance/impaired     Functional mobility during ADLs: +2 for safety/equipment;+2 for physical assistance General ADL Comments: dependent     Vision Baseline Vision/History: Wears glasses Wears Glasses: Reading only Patient Visual Report: No change from baseline              Pertinent Vitals/Pain Pain Assessment: Faces Faces Pain Scale: Hurts little more Pain Location: generalized Pain Descriptors / Indicators: Grimacing Pain Intervention(s): Monitored during session;Repositioned     Hand Dominance Right   Extremity/Trunk Assessment Upper Extremity Assessment Upper Extremity Assessment: Generalized weakness   Lower Extremity Assessment Lower Extremity Assessment: Generalized weakness       Communication Communication Communication: No difficulties   Cognition Arousal/Alertness: Lethargic Behavior During Therapy: Flat affect Overall Cognitive Status: No family/caregiver present to determine baseline cognitive functioning        General Comments: Pt oriented to self only. Was able to select hospital when given choice of two. All other questions he mostly did not answer or said "okay"              Home Living Family/patient expects to be discharged to:: Unsure      Additional Comments: Unable to assess secondary to pt cognition, no caregiver available to assist.      Prior Functioning/Environment Level of Independence: Independent        Comments: pt states being independent with all ADLs/IADLs, still working  OT Problem List: Decreased strength;Decreased coordination;Pain;Decreased range of motion;Decreased cognition;Increased edema;Decreased activity tolerance;Decreased safety awareness;Impaired balance (sitting and/or  standing);Decreased knowledge of use of DME or AE;Impaired UE functional use      OT Treatment/Interventions: Self-care/ADL training;Therapeutic exercise;Therapeutic activities;Neuromuscular education;Cognitive remediation/compensation;Energy conservation;Visual/perceptual remediation/compensation;DME and/or AE instruction;Patient/family education;Manual therapy;Balance training    OT Goals(Current goals can be found in the care plan section) Acute Rehab OT Goals Patient Stated Goal: none stated OT Goal Formulation: Patient unable to participate in goal setting Time For Goal Achievement: 04/03/20 Potential to Achieve Goals: Fair ADL Goals Pt Will Perform Grooming: with min assist Pt Will Transfer to Toilet: with max assist Pt Will Perform Toileting - Clothing Manipulation and hygiene: with max assist  OT Frequency: Min 1X/week   Barriers to D/C: Other (comment)  unknown at this time          AM-PAC OT "6 Clicks" Daily Activity     Outcome Measure Help from another person eating meals?: Total Help from another person taking care of personal grooming?: Total Help from another person toileting, which includes using toliet, bedpan, or urinal?: Total Help from another person bathing (including washing, rinsing, drying)?: Total Help from another person to put on and taking off regular upper body clothing?: Total Help from another person to put on and taking off regular lower body clothing?: Total 6 Click Score: 6   End of Session Nurse Communication: Mobility status  Activity Tolerance: Patient limited by lethargy;Patient limited by fatigue Patient left: in bed;with call bell/phone within reach;with bed alarm set  OT Visit Diagnosis: Muscle weakness (generalized) (M62.81)                Time: 9021-1155 OT Time Calculation (min): 20 min Charges:  OT General Charges $OT Visit: 1 Visit OT Evaluation $OT Eval High Complexity: 1 High OT Treatments $Self Care/Home Management : 8-22  mins  Darleen Crocker, MS, OTR/L , CBIS ascom [phone]  03/20/20, 1:36 PM

## 2020-03-20 NOTE — Progress Notes (Signed)
Nutrition Follow-up  DOCUMENTATION CODES:   Not applicable  INTERVENTION:  Plan is to initiate TPN per pharmacy.  Monitor magnesium, potassium, and phosphorus daily for at least 3 days, MD to replete as needed, as pt is at risk for refeeding syndrome given prolonged NPO status.  Recommend monitoring daily weights on TPN.  NUTRITION DIAGNOSIS:   Inadequate oral intake related to inability to eat as evidenced by NPO status.  Ongoing.  GOAL:   Patient will meet greater than or equal to 90% of their needs  Progressing with initiation of TPN.  MONITOR:   Vent status, Labs, Weight trends, Skin, I & O's  REASON FOR ASSESSMENT:   Ventilator    ASSESSMENT:   64 year old male with PMHx of DM, HTN, asthma, arthritis admitted with descending colon obstructive mass with cecum perforation s/p exploratory laparotomy, subtotal colectomy, negative pressure dressing placement on 9/1.  9/3 s/p re-opening of recent laparotomy, small bowel resection, drainage of intra abdominal abscess, ileostomy creation, negative pressure dressing 9/5 found to have left-sided colon cancer stage III 9/5 extubated  Met with patient at bedside. He reports he is still having some abdominal pain. Denies any nausea. He reports plan per surgeon is for pt to remain NPO today. Patient denies any flatus or BMs. Per RN documentation patient is distended, not passing flatus, but is documented to be having some output from ileostomy. Patient still with NGT to LIS.  Medications reviewed and include: Solu-Cortef 100 mg Q12hrs IV, Novolog 0-15 units TID, Novolog 0-5 units QHS, Lantusf 12 units daily, Protonix, 1/2 NS at 75 mL/hr, human albumin 12.5 grams daily IV, anidulafungin, ceftriaxone, D5 in LR at 40 mL/hr, Flagyl.  Labs reviewed: CBG 166-220, Sodium 154, Chloride 116, BUN 39, Phosphorus 2.1.  I/O: 2900 mL UOP yesterday (1.4 mL/kg/hr); 65 mL output from RLQ JP drain yesterday; 235 mL output from LLQ JP drain  yesterday; 0 mL output from wound VAC yesterday  Weight trend: no wt to trend since 9/1  Enteral Access: NGT to LIS  IV Access: right IJ CVC triple lumen placed 9/1  Discussed with surgeon via secure chat. Plan is to initiate TPN today. Discussed with RN and Pharmacy.  Diet Order:   Diet Order            Diet NPO time specified  Diet effective now                EDUCATION NEEDS:   No education needs have been identified at this time  Skin:  Skin Assessment: Skin Integrity Issues: Skin Integrity Issues:: Stage II Stage II: coccyx (5cm x 5cm) Incisions: closed incision to abdomen with wound VAC  Last BM:  03/20/2020 - type 7 per ileostomy  Height:   Ht Readings from Last 1 Encounters:  03/17/20 5' 7.99" (1.727 m)   Weight:   Wt Readings from Last 1 Encounters:  03/17/2020 86.6 kg   Ideal Body Weight:  70 kg  BMI:  Body mass index is 29.05 kg/m.  Estimated Nutritional Needs:   Kcal:  2200-2400  Protein:  115-125 grams  Fluid:  2.2-2.5 L/day  Donald Barnacle, MS, RD, LDN Pager number available on Amion

## 2020-03-20 NOTE — Consult Note (Addendum)
PHARMACY - TOTAL PARENTERAL NUTRITION CONSULT NOTE   Indication: Prolonged ileus  Patient Measurements: Height: 5' 7.99" (172.7 cm) Weight: 86.6 kg (191 lb) IBW/kg (Calculated) : 68.38 TPN AdjBW (KG): 73 Body mass index is 29.05 kg/m.  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 Patient was intubated 9/1 - 9/5. Patient has been weaned off pressors. Pharmacy has been consulted to initiate TPN for prolonged ileus.   Glucose / Insulin: 158 - 220 (16u Novolog last 24h) Electrolytes: Hypernatremia, hypophosphatemia Renal: AKI resolved. Good UOP LFTs / TGs: Mild transaminitis 9/3. CMP ordered for tomorrow Prealbumin / albumin: Albumin of 2 on 03/20/20. Patient is on daily albumin 12.5g. Intake / Output; MIVF: Good UOP. Net negative 3L last 2 days GI Imaging:  9/5 abdominal X-ray: Overall nonobstructive bowel gas pattern 9/1 CT abdomen pelvis: Large amount of pneumoperitoneum indicative of bowel perforation  Surgeries / Procedures:  9/1 Subtotal colectomy 9/3 I&D of intra-abdominal abscess, ileostomy creation  Central access: Central line placed 9/1 TPN start date: 9/7  Nutritional Goals (per RD recommendation on 9/7): kCal: 2200-2400 / day, Protein: 115-125 g/day, Fluid: 2.2 - 2.5 L / day Goal TPN rate is 105 mL/hr (provides 126 g of protein and 2435 kcals per day)   Current Nutrition:  NPO  Plan:  --Start TPN at 1/3 rate of 35 mL/hr at 1800 --Electrolytes in TPN: 83mEq/L of Na, 80mEq/L of K, 2mEq/L of Ca, 38mEq/L of Mg, and 3mmol/L of Phos. Cl:Ac ratio 1:1  --Add standard MVI and trace elements to TPN --Initiate Moderate q4h SSI + Lantus 12u BID and adjust as needed --Continue MIVF with D5w at 75 mL/hr --Monitor TPN labs on Mon/Thurs; will check K, Mg, Phos daily x 3 days to monitor for re-feeding  Benita Gutter 03/20/2020,9:41  AM

## 2020-03-20 NOTE — Progress Notes (Signed)
Assisted tele visit to patient with family member.  Jaykwon Morones D Artavis Cowie, RN   

## 2020-03-20 NOTE — Progress Notes (Signed)
CRITICAL CARE NOTE 64yo with PMH as below came in for abdominal pain and lethargy, found to be in shock and had CXR with air under the diaphragm. CT abd with bowel perforation , patient taken to OR emergently found to have 700cc mucopurulent material s/p surgery with resection and post operative septic shock. Patient brought to MICU on MV with open abdomen for medical management.   03/15/20- patient remains critically ill on MV, on elevated settings with vasopressor support. Spoke to wife and daughter at bedside today. Severe hyperglycemia will add on levemir maintanance. Renal function improved slightly. Reviwed short term care plan with Dr Peyton Najjar.  04/04/2020- Patient scheduled for OR today. Reviewed short term care plan with wife. Letter for out of work excuse provided. Discussed case with Anesthesiologist this am.   Patient febrile this am. Leukocystosis trending up partially due to demargination from solucortef therapy, weaned off neosynephrine overnight. Flud balance 8L positive. DIC excluded. Hepatic panel in process if normal will deliver Ofirmev. 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 I have also reached out to family and reviewed most recent findings with daughter [name].  Today plan is to wean down norepi and continue medical therapy for septic shock. Will place consult for onc eval to establish care plan for future should he survive this critical illness.  9/5 septic shock and resp failure, abd catastrophe 03/19/20- Extubated, on nasal cannula, although frequent suctioning required. Off pressors 03/20/20- On nasal cannula, drowsy but responds to voice, starting TPN  CC  follow up respiratory failure  SUBJECTIVE Patient remains critically ill   BP (!) 163/77   Pulse 79   Temp 98.6 F (37 C) (Oral)   Resp (!) 28   Ht 5' 7.99" (1.727 m)   Wt 86.6 kg   SpO2 97%   BMI 29.05 kg/m    I/O last 3 completed shifts: In: 3093.7  [I.V.:1097.7; BVQXI:5038; IV Piggyback:700.9] Out: 3950 [Urine:2900; Emesis/NG output:600; Drains:350; Stool:100] Total I/O In: 115.2 [I.V.:115.2] Out: 425 [Urine:400; Drains:25]  SpO2: 97 % O2 Flow Rate (L/min): 4 L/min FiO2 (%): 28 %  Estimated body mass index is 29.05 kg/m as calculated from the following:   Height as of this encounter: 5' 7.99" (1.727 m).   Weight as of this encounter: 86.6 kg.  SIGNIFICANT EVENTS   REVIEW OF SYSTEMS  PATIENT IS UNABLE TO PROVIDE COMPLETE REVIEW OF SYSTEMS DUE TO SEVERE CRITICAL ILLNESS   Pressure Injury 03/19/20 Coccyx Right;Lateral;Lower Stage 2 -  Partial thickness loss of dermis presenting as a shallow open injury with a red, pink wound bed without slough. 5cm by 5cm peeled dermis area shallow open ulcer with red pink wound bed (Active)  03/19/20 1800  Location: Coccyx  Location Orientation: Right;Lateral;Lower  Staging: Stage 2 -  Partial thickness loss of dermis presenting as a shallow open injury with a red, pink wound bed without slough.  Wound Description (Comments): 5cm by 5cm peeled dermis area shallow open ulcer with red pink wound bed  Present on Admission: No     Pressure Injury 03/19/20 Coccyx Left;Medial;Mid Stage 2 -  Partial thickness loss of dermis presenting as a shallow open injury with a red, pink wound bed without slough. Intact dark serum filled blister area. (Active)  03/19/20 1800  Location: Coccyx  Location Orientation: Left;Medial;Mid  Staging: Stage 2 -  Partial thickness loss of dermis presenting as a shallow open injury with a red, pink wound bed without slough.  Wound Description (  Comments): Intact dark serum filled blister area.  Present on Admission: No      PHYSICAL EXAMINATION:  GENERAL:critically ill appearing, +resp distress HEAD: Normocephalic, atraumatic.  EYES: Pupils equal, round, reactive to light.  No scleral icterus.  MOUTH: Moist mucosal membrane. NECK: Supple.  PULMONARY: +rhonchi,  +wheezing CARDIOVASCULAR: S1 and S2. Regular rate and rhythm. No murmurs, rubs, or gallops.  GASTROINTESTINAL: open, wound no BS MUSCULOSKELETAL: No swelling, clubbing, or edema.  NEUROLOGIC: obtunded, GCS<8 SKIN:intact,warm,dry  MEDICATIONS: I have reviewed all medications and confirmed regimen as documented   CULTURE RESULTS   Recent Results (from the past 240 hour(s))  SARS Coronavirus 2 by RT PCR (hospital order, performed in [facility] hospital lab) Nasopharyngeal Nasopharyngeal Swab     Status: None   Collection Time: 03/30/2020 12:26 PM   Specimen: Nasopharyngeal Swab  Result Value Ref Range Status   SARS Coronavirus 2 NEGATIVE NEGATIVE Final    Comment: (NOTE) SARS-CoV-2 target nucleic acids are NOT DETECTED.  The SARS-CoV-2 RNA is generally detectable in upper and lower respiratory specimens during the acute phase of infection. The lowest concentration of SARS-CoV-2 viral copies this assay can detect is 250 copies / mL. A negative result does not preclude SARS-CoV-2 infection and should not be used as the sole basis for treatment or other patient management decisions.  A negative result may occur with improper specimen collection / handling, submission of specimen other than nasopharyngeal swab, presence of viral mutation(s) within the areas targeted by this assay, and inadequate number of viral copies (<250 copies / mL). A negative result must be combined with clinical observations, patient history, and epidemiological information.  Fact Sheet for Patients:   StrictlyIdeas.no  Fact Sheet for Healthcare Providers: BankingDealers.co.za  This test is not yet approved or  cleared by the [place] FDA and has been authorized for detection and/or diagnosis of SARS-CoV-2 by FDA under an Emergency Use Authorization (EUA).  This EUA will remain in effect (meaning this test can be used) for the duration of the COVID-19  declaration under Section 564(b)(1) of the Act, 21 U.S.C. section 360bbb-3(b)(1), unless the authorization is terminated or revoked sooner.  Performed at [facility], [address] [place], [place] [address]   Blood culture (routine single)     Status: Abnormal   Collection Time: 04/10/2020  1:39 PM   Specimen: BLOOD  Result Value Ref Range Status   Specimen Description   Final    BLOOD BLOOD RIGHT FOREARM Performed at [facility], [address], [place], [place] [address]    Special Requests   Final    BOTTLES DRAWN AEROBIC AND ANAEROBIC Blood Culture results may not be optimal due to an excessive volume of blood received in culture bottles Performed at [facility], [address], [place], [place] [address]    Culture  Setup Time   Final    GRAM NEGATIVE RODS ANAEROBIC BOTTLE ONLY CRITICAL RESULT CALLED TO, READ BACK BY AND VERIFIED WITH: DUSTIN ZEIGLER AT 9470 ON 04/02/2020 SNG    Culture (A)  Final    BACTEROIDES THETAIOTAOMICRON BETA LACTAMASE POSITIVE Performed at [facility], [place] [address] [place], [place] [address]    Report Status 03/18/2020 FINAL  Final  Blood Culture ID Panel (Reflexed)     Status: None   Collection Time: 04/07/2020  1:39 PM  Result Value Ref Range Status   Enterococcus faecalis NOT DETECTED NOT DETECTED Final   Enterococcus Faecium NOT DETECTED NOT DETECTED  Final   Listeria monocytogenes NOT DETECTED NOT DETECTED Final   Staphylococcus species NOT DETECTED NOT DETECTED Final   Staphylococcus aureus (BCID) NOT DETECTED NOT DETECTED Final   Staphylococcus epidermidis NOT DETECTED NOT DETECTED Final   Staphylococcus lugdunensis NOT DETECTED NOT DETECTED Final   Streptococcus species NOT DETECTED NOT DETECTED Final   Streptococcus agalactiae NOT DETECTED NOT DETECTED Final   Streptococcus pneumoniae NOT DETECTED NOT DETECTED Final   Streptococcus pyogenes NOT DETECTED NOT DETECTED Final    A.calcoaceticus-baumannii NOT DETECTED NOT DETECTED Final   Bacteroides fragilis NOT DETECTED NOT DETECTED Final   Enterobacterales NOT DETECTED NOT DETECTED Final   Enterobacter cloacae complex NOT DETECTED NOT DETECTED Final   Escherichia coli NOT DETECTED NOT DETECTED Final   Klebsiella aerogenes NOT DETECTED NOT DETECTED Final   Klebsiella oxytoca NOT DETECTED NOT DETECTED Final   Klebsiella pneumoniae NOT DETECTED NOT DETECTED Final   Proteus species NOT DETECTED NOT DETECTED Final   Salmonella species NOT DETECTED NOT DETECTED Final   Serratia marcescens NOT DETECTED NOT DETECTED Final   Haemophilus influenzae NOT DETECTED NOT DETECTED Final   Neisseria meningitidis NOT DETECTED NOT DETECTED Final   Pseudomonas aeruginosa NOT DETECTED NOT DETECTED Final   Stenotrophomonas maltophilia NOT DETECTED NOT DETECTED Final   Candida albicans NOT DETECTED NOT DETECTED Final   Candida auris NOT DETECTED NOT DETECTED Final   Candida glabrata NOT DETECTED NOT DETECTED Final   Candida krusei NOT DETECTED NOT DETECTED Final   Candida parapsilosis NOT DETECTED NOT DETECTED Final   Candida tropicalis NOT DETECTED NOT DETECTED Final   Cryptococcus neoformans/gattii NOT DETECTED NOT DETECTED Final    Comment: Performed at [facility], [address] [place], [place] [address]  Aerobic/Anaerobic Culture (surgical/deep wound)     Status: None   Collection Time: 03/25/2020  3:18 PM   Specimen: PATH Other; Body Fluid  Result Value Ref Range Status   Specimen Description   Final    PERITONEAL Performed at [facility], [address], [place], [place] [address]    Special Requests   Final    PERIOTTONEAL CAVITY FLUID Performed at [facility], [place], [place] [address]    Gram Stain   Final    MODERATE WBC PRESENT, PREDOMINANTLY PMN ABUNDANT GRAM NEGATIVE RODS FEW GRAM POSITIVE COCCI RARE GRAM POSITIVE RODS Performed at [facility],  [place] [address], [place], [place] [address]    Culture   Final    FEW KLEBSIELLA ORNITHINOLYTICA FEW ESCHERICHIA COLI MIXED ANAEROBIC FLORA PRESENT.  CALL LAB IF FURTHER IID REQUIRED.    Report Status 03/18/2020 FINAL  Final   Organism ID, Bacteria KLEBSIELLA ORNITHINOLYTICA  Final   Organism ID, Bacteria ESCHERICHIA COLI  Final      Susceptibility   Escherichia coli - MIC*    AMPICILLIN 8 SENSITIVE Sensitive     CEFAZOLIN <=4 SENSITIVE Sensitive     CEFEPIME <=0.12 SENSITIVE Sensitive     CEFTAZIDIME <=1 SENSITIVE Sensitive     CEFTRIAXONE <=0.25 SENSITIVE Sensitive     CIPROFLOXACIN <=0.25 SENSITIVE Sensitive     GENTAMICIN <=1 SENSITIVE Sensitive     IMIPENEM <=0.25 SENSITIVE Sensitive     TRIMETH/SULFA <=20 SENSITIVE Sensitive     AMPICILLIN/SULBACTAM 4 SENSITIVE Sensitive     PIP/TAZO <=4 SENSITIVE Sensitive     * FEW ESCHERICHIA COLI   Klebsiella ornithinolytica - MIC*    AMPICILLIN >=32 RESISTANT Resistant     CEFAZOLIN <=4  SENSITIVE Sensitive     CEFEPIME <=0.12 SENSITIVE Sensitive     CEFTAZIDIME <=1 SENSITIVE Sensitive     CEFTRIAXONE <=0.25 SENSITIVE Sensitive     CIPROFLOXACIN <=0.25 SENSITIVE Sensitive     GENTAMICIN <=1 SENSITIVE Sensitive     IMIPENEM 1 SENSITIVE Sensitive     TRIMETH/SULFA <=20 SENSITIVE Sensitive     AMPICILLIN/SULBACTAM 4 SENSITIVE Sensitive     PIP/TAZO <=4 SENSITIVE Sensitive     * FEW KLEBSIELLA ORNITHINOLYTICA  MRSA PCR Screening     Status: None   Collection Time: 03/16/2020  6:21 PM   Specimen: Nasopharyngeal  Result Value Ref Range Status   MRSA by PCR NEGATIVE NEGATIVE Final    Comment:        The GeneXpert MRSA Assay (FDA approved for NASAL specimens only), is one component of a comprehensive MRSA colonization surveillance program. It is not intended to diagnose MRSA infection nor to guide or monitor treatment for MRSA infections. Performed at [facility], [address] [place], [place] [address]            IMAGING    No results found.   Nutrition Status: Nutrition Problem: Inadequate oral intake Etiology: inability to eat Signs/Symptoms: NPO status Interventions: Refer to RD note for recommendations   BMP Latest Ref Rng & Units 03/20/2020 03/19/2020 03/18/2020  Glucose 70 - 99 mg/dL 189(H) 196(H) 169(H)  BUN 8 - 23 mg/dL 39(H) 41(H) 41(H)  Creatinine 0.61 - 1.24 mg/dL 1.09 1.24 1.52(H)  Sodium 135 - 145 mmol/L 154(H) 154(H) 149(H)  Potassium 3.5 - 5.1 mmol/L 3.7 4.4 4.7  Chloride 98 - 111 mmol/L 116(H) 120(H) 115(H)  CO2 22 - 32 mmol/L 28 24 24   Calcium 8.9 - 10.3 mg/dL 7.5(L) 7.5(L) 7.1(L)     Indwelling Urinary Catheter continued, requirement due to   Reason to continue Indwelling Urinary Catheter strict Intake/Output monitoring for hemodynamic instability   Central Line/ continued, requirement due to  Reason to continue [place] of central venous pressure or other hemodynamic parameters and poor IV access   Ventilator continued, requirement due to severe respiratory failure   Ventilator Sedation RASS 0 to -2      ASSESSMENT AND PLAN SYNOPSIS   Severe ACUTE Hypoxic and Hypercapnic Respiratory Failure due to severe septic shock from perforated viscus -continue Bronchodilator Therapy -Wean supplemental oxygen as tolerated   ACUTE KIDNEY INJURY/Renal Failure -continue Foley Catheter-assess need -Avoid nephrotoxic agents -Follow urine output, BMP -Ensure adequate renal perfusion, optimize oxygenation -Renal dose medications -Discuss dialysis with nephrology -Follow bladder pressure, discuss with surgery   NEUROLOGY - intubated and sedated - minimal sedation to achieve a RASS goal: -1 Wake up assessment pending  Acute toxic metabolic encephalopathy, need for sedation Goal RASS -2 to -3  SHOCK-SEPSIS -use vasopressors to keep MAP>65 -follow ABG and LA -follow up cultures -emperic ABX -consider stress dose steroids  CARDIAC ICU  monitoring  ID -continue IV abx as prescibed -follow up cultures  GI GI PROPHYLAXIS as indicated  NUTRITIONAL STATUS Nutrition Status: Nutrition Problem: Inadequate oral intake Etiology: inability to eat Signs/Symptoms: NPO status Interventions: Refer to RD note for recommendations   DIET-->TF's as tolerated Constipation protocol as indicated  ENDO - will use ICU hypoglycemic\Hyperglycemia protocol if indicated     ELECTROLYTES -follow labs as needed -replace as needed -pharmacy consultation and following   DVT/GI PRX ordered and assessed TRANSFUSIONS AS NEEDED MONITOR FSBS I Assessed the need for Labs I Assessed the need for Foley I  Assessed the need for Central Venous Line Family Discussion when available I Assessed the need for Mobilization I made an Assessment of medications to be adjusted accordingly Safety Risk assessment completed   CASE DISCUSSED IN MULTIDISCIPLINARY ROUNDS WITH ICU TEAM  Critical Care Time devoted to patient care services described in this note is 34 minutes.   Overall, patient is critically ill, prognosis is guarded.  Patient with Multiorgan failure and at high risk for cardiac arrest and death.   PROGNOSIS IS POOR  Jorje Guild, M.D., M.P.H.  Pulmonary & Critical Care Medicine

## 2020-03-21 ENCOUNTER — Inpatient Hospital Stay: Payer: BC Managed Care – PPO

## 2020-03-21 DIAGNOSIS — R9431 Abnormal electrocardiogram [ECG] [EKG]: Secondary | ICD-10-CM

## 2020-03-21 DIAGNOSIS — I10 Essential (primary) hypertension: Secondary | ICD-10-CM

## 2020-03-21 DIAGNOSIS — E1169 Type 2 diabetes mellitus with other specified complication: Secondary | ICD-10-CM

## 2020-03-21 DIAGNOSIS — K668 Other specified disorders of peritoneum: Secondary | ICD-10-CM

## 2020-03-21 DIAGNOSIS — E119 Type 2 diabetes mellitus without complications: Secondary | ICD-10-CM

## 2020-03-21 DIAGNOSIS — I5020 Unspecified systolic (congestive) heart failure: Secondary | ICD-10-CM

## 2020-03-21 DIAGNOSIS — R0602 Shortness of breath: Secondary | ICD-10-CM

## 2020-03-21 LAB — CBC WITH DIFFERENTIAL/PLATELET
Abs Immature Granulocytes: 0.17 10*3/uL — ABNORMAL HIGH (ref 0.00–0.07)
Basophils Absolute: 0 10*3/uL (ref 0.0–0.1)
Basophils Relative: 0 %
Eosinophils Absolute: 0 10*3/uL (ref 0.0–0.5)
Eosinophils Relative: 0 %
HCT: 41.9 % (ref 39.0–52.0)
Hemoglobin: 13.3 g/dL (ref 13.0–17.0)
Immature Granulocytes: 1 %
Lymphocytes Relative: 6 %
Lymphs Abs: 0.8 10*3/uL (ref 0.7–4.0)
MCH: 28.9 pg (ref 26.0–34.0)
MCHC: 31.7 g/dL (ref 30.0–36.0)
MCV: 90.9 fL (ref 80.0–100.0)
Monocytes Absolute: 0.4 10*3/uL (ref 0.1–1.0)
Monocytes Relative: 3 %
Neutro Abs: 12.8 10*3/uL — ABNORMAL HIGH (ref 1.7–7.7)
Neutrophils Relative %: 90 %
Platelets: 161 10*3/uL (ref 150–400)
RBC: 4.61 MIL/uL (ref 4.22–5.81)
RDW: 14.1 % (ref 11.5–15.5)
WBC: 14.3 10*3/uL — ABNORMAL HIGH (ref 4.0–10.5)
nRBC: 0 % (ref 0.0–0.2)

## 2020-03-21 LAB — GLUCOSE, CAPILLARY
Glucose-Capillary: 322 mg/dL — ABNORMAL HIGH (ref 70–99)
Glucose-Capillary: 304 mg/dL — ABNORMAL HIGH (ref 70–99)
Glucose-Capillary: 284 mg/dL — ABNORMAL HIGH (ref 70–99)
Glucose-Capillary: 344 mg/dL — ABNORMAL HIGH (ref 70–99)
Glucose-Capillary: 295 mg/dL — ABNORMAL HIGH (ref 70–99)

## 2020-03-21 LAB — RENAL FUNCTION PANEL
Albumin: 2.3 g/dL — ABNORMAL LOW (ref 3.5–5.0)
Anion gap: 10 (ref 5–15)
BUN: 28 mg/dL — ABNORMAL HIGH (ref 8–23)
CO2: 30 mmol/L (ref 22–32)
Calcium: 7.5 mg/dL — ABNORMAL LOW (ref 8.9–10.3)
Chloride: 107 mmol/L (ref 98–111)
Creatinine, Ser: 0.8 mg/dL (ref 0.61–1.24)
GFR calc Af Amer: >60 mL/min (ref >60)
GFR calc non Af Amer: >60 mL/min (ref >60)
Glucose, Bld: 387 mg/dL — ABNORMAL HIGH (ref 70–99)
Phosphorus: 2.3 mg/dL — ABNORMAL LOW (ref 2.5–4.6)
Potassium: 3.2 mmol/L — ABNORMAL LOW (ref 3.5–5.1)
Sodium: 147 mmol/L — ABNORMAL HIGH (ref 135–145)

## 2020-03-21 LAB — PHOSPHORUS: Phosphorus: 2.2 mg/dL — ABNORMAL LOW (ref 2.5–4.6)

## 2020-03-21 LAB — COMPREHENSIVE METABOLIC PANEL
ALT: 177 U/L — ABNORMAL HIGH (ref 0–44)
AST: 79 U/L — ABNORMAL HIGH (ref 15–41)
Albumin: 2.3 g/dL — ABNORMAL LOW (ref 3.5–5.0)
Alkaline Phosphatase: 101 U/L (ref 38–126)
Anion gap: 9 (ref 5–15)
BUN: 29 mg/dL — ABNORMAL HIGH (ref 8–23)
CO2: 31 mmol/L (ref 22–32)
Calcium: 7.5 mg/dL — ABNORMAL LOW (ref 8.9–10.3)
Chloride: 107 mmol/L (ref 98–111)
Creatinine, Ser: 0.86 mg/dL (ref 0.61–1.24)
GFR calc Af Amer: >60 mL/min (ref >60)
GFR calc non Af Amer: >60 mL/min (ref >60)
Glucose, Bld: 392 mg/dL — ABNORMAL HIGH (ref 70–99)
Potassium: 3.3 mmol/L — ABNORMAL LOW (ref 3.5–5.1)
Sodium: 147 mmol/L — ABNORMAL HIGH (ref 135–145)
Total Bilirubin: 1.2 mg/dL (ref 0.3–1.2)
Total Protein: 5.7 g/dL — ABNORMAL LOW (ref 6.5–8.1)

## 2020-03-21 LAB — TRIGLYCERIDES: Triglycerides: 197 mg/dL — ABNORMAL HIGH (ref <150)

## 2020-03-21 LAB — BRAIN NATRIURETIC PEPTIDE: B Natriuretic Peptide: 611.8 pg/mL — ABNORMAL HIGH (ref 0.0–100.0)

## 2020-03-21 LAB — PREALBUMIN: Prealbumin: 8 mg/dL — ABNORMAL LOW (ref 18–38)

## 2020-03-21 LAB — POTASSIUM: Potassium: 3.5 mmol/L (ref 3.5–5.1)

## 2020-03-21 LAB — MAGNESIUM: Magnesium: 1.9 mg/dL (ref 1.7–2.4)

## 2020-03-21 LAB — TROPONIN I (HIGH SENSITIVITY)
Troponin I (High Sensitivity): 146 ng/L (ref <18)
Troponin I (High Sensitivity): 142 ng/L (ref <18)

## 2020-03-21 MED ORDER — CARVEDILOL 6.25 MG PO TABS: 6.2500 mg | ORAL_TABLET | Freq: Two times a day (BID) | ORAL | Status: DC

## 2020-03-21 MED ORDER — DEXTROSE 5 % IV SOLN: INTRAVENOUS | Status: AC

## 2020-03-21 MED ORDER — HYDRALAZINE HCL 20 MG/ML IJ SOLN: 10.0000 mg | Freq: Four times a day (QID) | INTRAMUSCULAR | Status: DC | PRN

## 2020-03-21 MED ORDER — INSULIN ASPART 100 UNIT/ML ~~LOC~~ SOLN
0.0000 [IU] | SUBCUTANEOUS | Status: DC
  Administered 2020-03-21 – 2020-03-22 (×5): 11 [IU] via SUBCUTANEOUS
  Administered 2020-03-22: 15 [IU] via SUBCUTANEOUS
  Administered 2020-03-22 – 2020-03-23 (×4): 11 [IU] via SUBCUTANEOUS
  Administered 2020-03-23: 15 [IU] via SUBCUTANEOUS
  Administered 2020-03-23 (×2): 11 [IU] via SUBCUTANEOUS
  Administered 2020-03-24 (×2): 15 [IU] via SUBCUTANEOUS
  Administered 2020-03-24 (×3): 11 [IU] via SUBCUTANEOUS
  Administered 2020-03-25 (×2): 15 [IU] via SUBCUTANEOUS
  Administered 2020-03-25: 4 [IU] via SUBCUTANEOUS
  Administered 2020-03-25: 7 [IU] via SUBCUTANEOUS
  Administered 2020-03-25: 15 [IU] via SUBCUTANEOUS
  Administered 2020-03-25: 4 [IU] via SUBCUTANEOUS
  Filled 2020-03-21 (×24): qty 1

## 2020-03-21 MED ORDER — MORPHINE SULFATE (PF) 2 MG/ML IV SOLN: 0.5000 mg | Freq: Once | INTRAVENOUS | Status: DC

## 2020-03-21 MED ORDER — INSULIN GLARGINE 100 UNIT/ML ~~LOC~~ SOLN
20.0000 [IU] | Freq: Every day | SUBCUTANEOUS | Status: DC
  Administered 2020-03-21 – 2020-03-22 (×2): 20 [IU] via SUBCUTANEOUS
  Filled 2020-03-21 (×3): qty 0.2

## 2020-03-21 MED ORDER — DEXTROSE 5 % IV SOLN: INTRAVENOUS | Status: DC

## 2020-03-21 MED ORDER — TRAVASOL 10 % IV SOLN
INTRAVENOUS | Status: AC
  Filled 2020-03-21: qty 840

## 2020-03-21 MED ORDER — LOSARTAN POTASSIUM 25 MG PO TABS: 25.0000 mg | ORAL_TABLET | Freq: Every day | ORAL | Status: DC

## 2020-03-21 MED ORDER — HYDROCORTISONE NA SUCCINATE PF 100 MG IJ SOLR
50.0000 mg | Freq: Once | INTRAMUSCULAR | Status: AC
  Administered 2020-03-21: 50 mg via INTRAVENOUS
  Filled 2020-03-21: qty 1

## 2020-03-21 NOTE — Progress Notes (Signed)
MD patel notified. Pts RR is 44 per min. QTC is 544. Oxygen saturation was decreased upon arriving to room. Oxygen turned to 5L per [place] and recovered to 90%. MD orders stat CXR and RN will do an EKG for verify QTC. RN will report to care RN.

## 2020-03-21 NOTE — Progress Notes (Signed)
Inpatient Diabetes Program Recommendations  AACE/ADA: New Consensus Statement on Inpatient Glycemic Control   Target Ranges:  Prepandial:   less than 140 mg/dL      Peak postprandial:   less than 180 mg/dL (1-2 hours)      Critically ill patients:  140 - 180 mg/dL   Results for Gioffre, Kwadwo JR. Edwyna Ready (MRN [id number]) as of 03/21/2020 11:12  Ref. Range 03/20/2020 07:15 03/20/2020 11:30 03/20/2020 16:08 03/20/2020 19:28 03/20/2020 23:54 03/21/2020 04:08 03/21/2020 08:29  Glucose-Capillary Latest Ref Range: 70 - 99 mg/dL 166 (H) 222 (H) 226 (H) 241 (H) 300 (H) 322 (H) 304 (H)   Review of Glycemic Control  Diabetes history: DM2 Outpatient Diabetes medications: Metformin 1000 mg BID, Amaryl 4 mg BID Current orders for Inpatient glycemic control: Lantus 20 units daily, Novolog 0-15 units Q4H; TPN @ 35 ml/hr (increasing to 70 ml/hr at 18:00 today), Solucortef 50 mg x1   Inpatient Diabetes Program Recommendations:    Insulin: Noted glucose more elevated with TPN and Lantus increased from 12 units to 20 units daily today. Please consider adding Lantus 15 units x1 at 22:00 today and increasing Novolog correction to 0-20 units Q4H. Then tomorrow, would recommend adding insulin to TPN which would be safer versus increasing basal insulin in case TPN is stopped or rate decreased.    Thanks, Barnie Alderman, RN, MSN, CDE Diabetes Coordinator Inpatient Diabetes Program [phone] (Team Pager from 8am to 5pm)

## 2020-03-21 NOTE — Progress Notes (Signed)
Patient ID: Donald Stephenson., male   DOB: 1956/01/07, 64 y.o.   MRN: [id number]  Called for rizing MEWS score patient's heart rate 103. QTC interval on the monitor 563. Sats stable on current oxygen. Respiratory rate 38 to 44. Patient asymptomatic. Answers questions appropriately. Denies any pain.  Replacing potassium. Pharmacy consult placed cardiology consulted for prolonged QTC.-- [facility] cardiology aware.  Will continue to monitor.  Chest x-ray shows bilateral atelectasis with small pleural effusion. No respiratory distress noted. Small dose of morphine to take that edge off elevated respiratory rate.

## 2020-03-21 NOTE — Consult Note (Signed)
PHARMACY - TOTAL PARENTERAL NUTRITION CONSULT NOTE   Indication: Prolonged ileus  Patient Measurements: Height: 5' 7.99" (172.7 cm) Weight: 90 kg (198 lb 6.6 oz) IBW/kg (Calculated) : 68.38 TPN AdjBW (KG): 73 Body mass index is 30.18 kg/m.  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 Patient was intubated 9/1 - 9/5. Patient has been weaned off pressors. Pharmacy has been consulted to initiate TPN for prolonged ileus.   Glucose / Insulin: 222 - 322 (36u Novolog last 24h); patient was on IV steroids and will receive one more dose today. Expect BG to come down Electrolytes: Hypernatremia (154>147), hypophosphatemia (2.1>2.3) Renal: AKI resolved. Good UOP LFTs / TGs: Mild transaminitis 9/3. CMP ordered for tomorrow Prealbumin / albumin: Albumin of 2 on 03/20/20. Patient is on daily albumin 12.5g. Intake / Output; MIVF: Good UOP - net ~1.7L 9/7; D5W @50mL /hr GI Imaging:  9/5 abdominal X-ray: Overall nonobstructive bowel gas pattern 9/1 CT abdomen pelvis: Large amount of pneumoperitoneum indicative of bowel perforation  Surgeries / Procedures:  9/1 Subtotal colectomy 9/3 I&D of intra-abdominal abscess, ileostomy creation  Central access: Central line placed 9/1 TPN start date: 9/7  Nutritional Goals (per RD recommendation on 9/7): kCal: 2200-2400 / day, Protein: 115-125 g/day, Fluid: 2.2 - 2.5 L / day Goal TPN rate is 105 mL/hr (provides 126 g of protein and 2435 kcals per day)   Current Nutrition:  NPO  Plan:  --Advance TPN to 2/3 goal rate of 70 mL/hr at 1800 --Electrolytes in TPN: 61mEq/L of Na, 38mEq/L of K, 14mEq/L of Ca, 60mEq/L of Mg, and 27mmol/L of Phos. Cl:Ac ratio 1:1  --Add standard MVI and trace elements to TPN  --Continue Moderate q4h SSI + Lantus 12u BID and adjust as needed --Decrease MIVF D5W from 61mL/hr to  9mL/hr --Monitor TPN labs on Mon/Thurs; will check K, Mg, Phos daily x 3 days to monitor for re-feeding  Sherilyn Banker, PharmD Pharmacy Resident  03/21/2020 8:13 AM

## 2020-03-21 NOTE — Progress Notes (Signed)
Upon reviewing MEWS, came to bedside to assess patient. RR in 40's, O2 sats 88% on 4L, increased to 5L sats now 91%, RT called to assess patient and charge RN notified. Bedside monitor shows QTC 575, will notify primary RN and MD.

## 2020-03-21 NOTE — Progress Notes (Signed)
[place] Hospital Day(s): 7.   Post op day(s): 5 Days Post-Op.   Interval History: Patient seen and examined, no acute events or new complaints overnight. Patient transferred to progressive unit. Alert, cooperative but very debilitated. Sill answering his name but no other questions.   Vital signs in last 24 hours: [min-max] current  Temp:  [97.7 F (36.5 C)-98.8 F (37.1 C)] 98.2 F (36.8 C) (09/08 0800) Pulse Rate:  [57-103] 99 (09/08 0800) Resp:  [22-38] 36 (09/08 0800) BP: (116-186)/(74-95) 157/94 (09/08 0800) SpO2:  [93 %-98 %] 96 % (09/08 0800) Weight:  [90 kg] 90 kg (09/07 2235)     Height: 5' 7.99" (172.7 cm) Weight: 90 kg BMI (Calculated): 30.18   Physical Exam:  Constitutional: alert, cooperative and no distress. Oriented to person Respiratory: breathing non-labored at rest  Cardiovascular: regular rate and sinus rhythm  Gastrointestinal: soft, non-tender, and non-distended. Ostomy pink and patent  Labs:  CBC Latest Ref Rng & Units 03/21/2020 03/20/2020 03/19/2020  WBC 4.0 - 10.5 K/uL 14.3(H) 14.4(H) 12.0(H)  Hemoglobin 13.0 - 17.0 g/dL 13.3 12.4(L) 11.2(L)  Hematocrit 39 - 52 % 41.9 38.9(L) 36.1(L)  Platelets 150 - 400 K/uL 161 125(L) 94(L)   CMP Latest Ref Rng & Units 03/21/2020 03/21/2020 03/20/2020  Glucose 70 - 99 mg/dL 392(H) 387(H) 189(H)  BUN 8 - 23 mg/dL 29(H) 28(H) 39(H)  Creatinine 0.61 - 1.24 mg/dL 0.86 0.80 1.09  Sodium 135 - 145 mmol/L 147(H) 147(H) 154(H)  Potassium 3.5 - 5.1 mmol/L 3.3(L) 3.2(L) 3.7  Chloride 98 - 111 mmol/L 107 107 116(H)  CO2 22 - 32 mmol/L 31 30 28   Calcium 8.9 - 10.3 mg/dL 7.5(L) 7.5(L) 7.5(L)  Total Protein 6.5 - 8.1 g/dL 5.7(L) - -  Total Bilirubin 0.3 - 1.2 mg/dL 1.2 - -  Alkaline Phos 38 - 126 U/L 101 - -  AST 15 - 41 U/L 79(H) - -  ALT 0 - 44 U/L 177(H) - -    Imaging studies: No new pertinent imaging studies   Assessment/Plan:  64 y.o.malewith descending colon obstructive mass with cecum  perforation7Day Post-Ops/p subtotal colectomyand interval reopening of recent laparotomy with drainage of intra-abdominal abscess and ileostomy creation postoperative day #5, complicated by pertinent comorbidities including, respiratory failure mechanical ventilation due to hypoxia (resolved),septic shock (resolved), acute kidney injury (resolved), diabetes, hypertension, and newly diagnosed malignant neoplasm of the splenic flexure.  Patient slowly recovering. Still very debilitated. Still oriented just to person. Today there is stool in the ileostomy bag. I will order to clamp NGT and have evaluation by speech an swallow for recommendations.   Agree with discontinue indwelling foley. Patient currently on TPN. Will continue IV abx therapy.   No contraindication for physical therapy.   Appreciated Hospitalist management of medical comorbidity.   Arnold Long, MD

## 2020-03-21 NOTE — Consult Note (Signed)
Brief Pharmacy Note   Consult for electrolyte management - pt is on TPN. K added to TPN for hypokalemia this morning - will be addressed with AM labs.   Benn Moulder, PharmD Pharmacy Resident  03/21/2020 4:51 PM

## 2020-03-21 NOTE — Progress Notes (Signed)
PT Cancellation Note  Patient Details Name: Donald Stephenson. MRN: [id number] DOB: [birth year]   Cancelled Treatment:     Per discussion with RN. Hold PT at this time. Pt is going to have swallowing study and has had MEWs score of 3 ( elevated RR, BP,and HR). Will return at later time and date when pt is appropriate to participate.    Willette Pa 03/21/2020, 2:41 PM

## 2020-03-21 NOTE — Progress Notes (Signed)
Donald Stephenson at [place] NAME: Donald Stephenson    MR#:  [id number]  DATE OF BIRTH:  Sep 26, 1955  SUBJECTIVE:  awake alert. Answers questions appropriately. Slow to respond. Denies any abdominal pain. Currently has NG tube with bilious output. Has TPN going well.  REVIEW OF SYSTEMS:   Review of Systems  Constitutional: Negative for chills, fever and weight loss.  HENT: Negative for ear discharge, ear pain and nosebleeds.   Eyes: Negative for blurred vision, pain and discharge.  Respiratory: Negative for sputum production, shortness of breath, wheezing and stridor.   Cardiovascular: Negative for chest pain, palpitations, orthopnea and PND.  Gastrointestinal: Negative for abdominal pain, diarrhea, nausea and vomiting.  Genitourinary: Negative for frequency and urgency.  Musculoskeletal: Negative for back pain and joint pain.  Neurological: Negative for sensory change, speech change, focal weakness and weakness.  Psychiatric/Behavioral: Negative for depression and hallucinations. The patient is not nervous/anxious.    Tolerating Diet: NPO Tolerating PT: pending  DRUG ALLERGIES:   Allergies  Allergen Reactions  . Bee Venom Swelling  . Other Hives    ants    VITALS:  Blood pressure (!) 168/90, pulse 92, temperature 98 F (36.7 C), temperature source Oral, resp. rate (!) 45, height 5' 7.99" (1.727 m), weight 90 kg, SpO2 92 %.  PHYSICAL EXAMINATION:   Physical Exam  GENERAL:  64 y.o.-year-old patient lying in the bed with no acute distress. Chronically critically ill  EYES: Pupils equal, round, reactive to light and accommodation. No scleral icterus. NG tube+ HEENT: Head atraumatic, normocephalic. Oropharynx and nasopharynx clear.  NECK:  Supple, no jugular venous distention. No thyroid enlargement, no tenderness. Right IJ + LUNGS: Normal breath sounds bilaterally, no wheezing, rales, rhonchi. No use of accessory muscles of respiration.   CARDIOVASCULAR: S1, S2 normal. No murmurs, rubs, or gallops.  ABDOMEN: colostomy plus. Midline surgical scar present. EXTREMITIES: No cyanosis, clubbing or edema b/l.    NEUROLOGIC: moves all extremities well.  PSYCHIATRIC:  patient is alert and awake SKIN Pressure Injury 03/19/20 Coccyx Right;Lateral;Lower Stage 2 -  Partial thickness loss of dermis presenting as a shallow open injury with a red, pink wound bed without slough. 5cm by 5cm peeled dermis area shallow open ulcer with red pink wound bed (Active)  03/19/20 1800  Location: Coccyx  Location Orientation: Right;Lateral;Lower  Staging: Stage 2 -  Partial thickness loss of dermis presenting as a shallow open injury with a red, pink wound bed without slough.  Wound Description (Comments): 5cm by 5cm peeled dermis area shallow open ulcer with red pink wound bed  Present on Admission: No     Pressure Injury 03/19/20 Coccyx Left;Medial;Mid Stage 2 -  Partial thickness loss of dermis presenting as a shallow open injury with a red, pink wound bed without slough. Intact dark serum filled blister area. (Active)  03/19/20 1800  Location: Coccyx  Location Orientation: Left;Medial;Mid  Staging: Stage 2 -  Partial thickness loss of dermis presenting as a shallow open injury with a red, pink wound bed without slough.  Wound Description (Comments): Intact dark serum filled blister area.  Present on Admission: No       LABORATORY PANEL:  CBC Recent Labs  Lab 03/21/20 0552  WBC 14.3*  HGB 13.3  HCT 41.9  PLT 161    Chemistries  Recent Labs  Lab 03/21/20 0552  NA 147*  147*  K 3.3*  3.2*  CL 107  107  CO2 31  30  GLUCOSE 392*  387*  BUN 29*  28*  CREATININE 0.86  0.80  CALCIUM 7.5*  7.5*  MG 1.9  AST 79*  ALT 177*  ALKPHOS 101  BILITOT 1.2   Cardiac Enzymes No results for input(s): TROPONINI in the last 168 hours. RADIOLOGY:  DG Chest Port 1 View  Result Date: 03/21/2020 CLINICAL DATA:  Shortness of breath.  EXAM: PORTABLE CHEST 1 VIEW COMPARISON:  March 14, 2020. FINDINGS: Stable cardiomediastinal silhouette. Nasogastric tube tip is seen in proximal stomach. Endotracheal tube has been removed. Right internal jugular catheter is unchanged in position. No pneumothorax is noted. Mild bibasilar subsegmental atelectasis is noted. Small left pleural effusion may be present. Bony thorax is unremarkable. IMPRESSION: Mild bibasilar subsegmental atelectasis. Small left pleural effusion may be present. Endotracheal tube has been removed. Electronically Signed   By: Marijo Conception M.D.   On: 03/21/2020 16:29   ASSESSMENT AND PLAN:  Donald Stephenson is a 64 y.o. M with diabetes, hypertension who presented with abdominal pain.   In the ER, found to have air under diaphragm, taken emergently to OR for ex-lap, found to have perforated viscus, underwent colectomy, returned to ICU intubated and with open abdomen.  pt was transferred out of ICU 03/20/20  Septic shock due to perforated colon Acute respiratory failrue with hypoxia due to septic shock S/p ex-lap, subtotal colectomy and negative pressure dressing by Dr. Peyton Najjar 9/1 S/p small bowel resection, replaced negative pressure dressing, I&D, and ileostomy creation by Dr. Peyton Najjar 9/3 Patient presented with tachycardia, tachypnea, AMS, and lactate 7 mmol/L, found to have free air in abdomen, taken for emergent ex-lap. -Taken back to ICU intubated with negative pressure dressing.  Repeat ex-lap 9/3 with ileostomy creation. -Extubated 9/6 -transferred out of ICU 9/7 -abdominal wound  cultures growing only Bacteroides and sensitive GNRs. -Given severity of infection, will continue anidulafungin 7 total days empirically then stop -Narrow to Flagyl, ceftriaxone -discontinue Cortef after today's dose -TPN per Surgery -clear liquid diet recommended. Will have speech do swallow eval   Newly diagnosed metastatic adenoCA of colon -COnsult Oncology, appreciate  cares -- patient seen by Dr. Rogue Bussing.  Prolonged QTC noted on EKG -potassium 3.2 -pharmacy consult placed for replacement -[facility] cardiology to see patient  Diabetes type 2 with hyperglycemia without long term insulin Glucoses elevated -Start Lantus -Continue insulin, add HS  -Hold Jardiance, metformin, glimepiride  Hypertension -BP soft -Continue metoprolol -Hold amlodipine, lisinopril  Pressure injury, stage II coccyx, not POA continue wound management  Acute renal failure due to septic shock Resolved   GI protection -Continue PPI  Hypernatremia -Start hypotonic fluids-- D5 water  Thrombocytopenia Mild, resolving, no bleeding  Foley removed 9/8  Disposition: Status is: Inpatient  Remains inpatient appropriate because:Inpatient level of care appropriate due to severity of illness   Dispo: The patient is from: Home  Anticipated d/c is to: SNF  Anticipated d/c date is: > 3 days  Patient currently is not medically stable to d/c. Patient admitted with perforated colon.  Now s/p ex-lab and ostomy.  Started on TPN  speech evaluation pending physical therapy to see TOC for discharge planning     TOTAL TIME TAKING CARE OF THIS PATIENT: *35* minutes.  >50% time spent on counselling and coordination of care  Note: This dictation was prepared with Dragon dictation along with smaller phrase technology. Any transcriptional errors that result from this process are unintentional.  Fritzi Mandes M.D    [facility]   CC: Primary care physician;  Donnamarie Rossetti, PA-CPatient ID: Donald Pippins., male   DOB: 1955/12/04, 63 y.o.   MRN: [id number]

## 2020-03-21 NOTE — Progress Notes (Signed)
[place] Hospital Day(s): 7.   Post op day(s): 5 Days Post-Op.   Interval History: Patient seen and examined, no acute events or new complaints overnight. Patient alert answering yes to most question but not oriented.   Vital signs in last 24 hours: [min-max] current  Temp:  [97.7 F (36.5 C)-98.8 F (37.1 C)] 98.2 F (36.8 C) (09/08 0800) Pulse Rate:  [57-103] 99 (09/08 0800) Resp:  [22-38] 36 (09/08 0800) BP: (116-186)/(74-95) 157/94 (09/08 0800) SpO2:  [93 %-98 %] 96 % (09/08 0800) Weight:  [90 kg] 90 kg (09/07 2235)     Height: 5' 7.99" (172.7 cm) Weight: 90 kg BMI (Calculated): 30.18   Physical Exam:  Constitutional: alert, cooperative and no distress  Respiratory: breathing non-labored at rest  Cardiovascular: regular rate and sinus rhythm  Gastrointestinal: soft, non-tender, and non-distended. Ileostomy pink and patent. Wound VAC changed. Wound new measurement is 25 cm long x 2 cm wide.   Labs:  CBC Latest Ref Rng & Units 03/21/2020 03/20/2020 03/19/2020  WBC 4.0 - 10.5 K/uL 14.3(H) 14.4(H) 12.0(H)  Hemoglobin 13.0 - 17.0 g/dL 13.3 12.4(L) 11.2(L)  Hematocrit 39 - 52 % 41.9 38.9(L) 36.1(L)  Platelets 150 - 400 K/uL 161 125(L) 94(L)   CMP Latest Ref Rng & Units 03/21/2020 03/21/2020 03/20/2020  Glucose 70 - 99 mg/dL 392(H) 387(H) 189(H)  BUN 8 - 23 mg/dL 29(H) 28(H) 39(H)  Creatinine 0.61 - 1.24 mg/dL 0.86 0.80 1.09  Sodium 135 - 145 mmol/L 147(H) 147(H) 154(H)  Potassium 3.5 - 5.1 mmol/L 3.3(L) 3.2(L) 3.7  Chloride 98 - 111 mmol/L 107 107 116(H)  CO2 22 - 32 mmol/L 31 30 28   Calcium 8.9 - 10.3 mg/dL 7.5(L) 7.5(L) 7.5(L)  Total Protein 6.5 - 8.1 g/dL 5.7(L) - -  Total Bilirubin 0.3 - 1.2 mg/dL 1.2 - -  Alkaline Phos 38 - 126 U/L 101 - -  AST 15 - 41 U/L 79(H) - -  ALT 0 - 44 U/L 177(H) - -    Imaging studies: No new pertinent imaging studies   Assessment/Plan:  64 y.o.malewith descending colon obstructive mass with cecum perforation6Day  Post-Ops/p subtotal colectomyand interval reopening of recent laparotomy with drainage of intra-abdominal abscess and ileostomy creation postoperative day #4, complicated by pertinent comorbidities including, respiratory failure mechanical ventilation due to hypoxia (resolved),septic shock (resolved), acute kidney injury (resolved), diabetes, hypertension, and newly diagnosed malignant neoplasm of the splenic flexure.  Patient still with post op ileus. The wound VAC dressing was changed by me. New measurement is 25 cm x 2 cm. Still not producing adequate granulation tissue. There is no purulence or ischemic tissue. I also changed the ileostomy bag.   Agree with NGT to suction, TPN,  IV abx, DVT prophylaxis, physical therapy. Appreciate ICU team management and recommendations.   Arnold Long, MD

## 2020-03-21 NOTE — Progress Notes (Signed)
SLP Cancellation Note  Patient Details Name: Donald Stephenson. MRN: [id number] DOB: [birth year]   Cancelled treatment:       Reason Eval/Treat Not Completed: Medical issues which prohibited therapy   Pt is not appropriate for Bedside Swallow Evaluation d/t medical decline and current evaluation by team of nurses.   Tymira Horkey B. Rutherford Nail M.S., CCC-SLP, [place] Office [phone]    [name] 03/21/2020, 4:33 PM

## 2020-03-21 NOTE — Consult Note (Addendum)
Cardiology Consultation:   Patient ID: Donald Stephenson. MRN: [id number]; DOB: [birth year]  Admit date: 03/27/2020 Date of Consult: 03/21/2020  Primary Care Provider: Donnamarie Rossetti, PA-C [facility] Cardiologist: New- Dr. Garen Lah consulted [place] Electrophysiologist:  None    Patient Profile:   Donald Stephenson. is a 63 y.o. male with a hx of hypertension, diabetes who is being seen today for the evaluation of abnormal EKG at the request of Dr. Posey Pronto.  History of Present Illness:   Donald Stephenson is a 20 year old gentleman with history of hypertension, diabetes who presents to the hospital due to a 6-day history of constipation and bloating.  Patient denies any prior history of heart disease, denies chest pain or abdominal pain on admission.    EKG on admission showed sinus tachycardia, QTC was normal.  Imaging in the ED showed air under the diaphragm consistent with a perforated viscus.  Surgery was consulted and patient emergently taken to the OR where exlap was performed.  A descending colon obstructive mass with cecal perforation was noted.  Patient underwent subtotal colectomy with creation of ileostomy.  Hospital course complicated by respiratory failure requiring mechanical ventilation.  Patient was eventually successfully extubated.  Initially, he was septic with lactic of 7, placed on IV fluids and antibiotics.  Pathology of colon mass revealed adenocarcinoma.  Oncology has been consulted.  EKG today showed sinus tachycardia with right bundle branch block, QTC 560.  Patient denies any history of heart disease.  Echocardiogram obtained on 04/05/2020 showed moderately reduced ejection fraction, EF 35-40%   Past Medical History:  Diagnosis Date  . Arthritis   . Asthma    as a child-no problems since childhood  . Diabetes mellitus without complication ([place])   . Hypertension     Past Surgical History:  Procedure Laterality Date  . BOWEL RESECTION N/A 03/25/2020    Procedure: SMALL BOWEL RESECTION;  Surgeon: Herbert Pun, MD;  Location: [facility] ORS;  Service: General;  Laterality: N/A;  . ILEOSTOMY N/A 04/01/2020   Procedure: ILEOSTOMY CREATION;  Surgeon: Herbert Pun, MD;  Location: [facility] ORS;  Service: General;  Laterality: N/A;  . LAPAROTOMY N/A 03/25/2020   Procedure: EXPLORATORY LAPAROTOMY SUBTOTAL COLECTOMY NEGATIVE PRESSURE DRESSING PLACEMENT ;  Surgeon: Herbert Pun, MD;  Location: [facility] ORS;  Service: General;  Laterality: N/A;  . NO PAST SURGERIES    . TOTAL HIP ARTHROPLASTY Left 04/19/2019   Procedure: TOTAL HIP ARTHROPLASTY ANTERIOR APPROACH;  Surgeon: Hessie Knows, MD;  Location: [facility] ORS;  Service: Orthopedics;  Laterality: Left;  . WOUND DEBRIDEMENT N/A 04/01/2020   Procedure: DEBRIDEMENT CLOSURE/ABDOMINAL WOUND;  Surgeon: Herbert Pun, MD;  Location: [facility] ORS;  Service: General;  Laterality: N/A;     Home Medications:  Prior to Admission medications   Medication Sig Start Date End Date Taking? Authorizing Provider  amLODipine (NORVASC) 10 MG tablet Take 10 mg by mouth daily.   Yes [provider]  glimepiride (AMARYL) 4 MG tablet Take 4 mg by mouth 2 (two) times daily with a meal.   Yes [provider]  JARDIANCE 25 MG TABS tablet Take 25 mg by mouth daily. 02/18/20  Yes [provider]  lisinopril (ZESTRIL) 40 MG tablet Take 40 mg by mouth every morning.   Yes [provider]  metFORMIN (GLUCOPHAGE) 1000 MG tablet Take 1,000 mg by mouth 2 (two) times daily with a meal.   Yes [provider]  Multiple Vitamin (MULTIVITAMIN) tablet Take 1 tablet by mouth daily.   Yes  [provider]  tadalafil (CIALIS) 5 MG tablet Take 5 mg by mouth daily as needed for erectile dysfunction.    Yes [provider]    Inpatient Medications: Scheduled Meds: . chlorhexidine  15 mL Mouth Rinse BID  . Chlorhexidine Gluconate Cloth  6 each Topical Daily  . enoxaparin  (LOVENOX) injection  40 mg Subcutaneous Q24H  . insulin aspart  0-20 Units Subcutaneous Q4H  . insulin glargine  20 Units Subcutaneous Daily  . mouth rinse  15 mL Mouth Rinse q12n4p  . metoprolol tartrate  12.5 mg Oral Daily  .  morphine injection  0.5 mg Intravenous Once  . pantoprazole (PROTONIX) IV  40 mg Intravenous QHS   Continuous Infusions: . anidulafungin 100 mg (03/21/20 1650)  . cefTRIAXone (ROCEPHIN)  IV 2 g (03/21/20 1336)  . dextrose 50 mL/hr at 03/21/20 0903  . dextrose 25 mL/hr at 03/21/20 1703  . metronidazole 500 mg (03/21/20 1106)  . TPN ADULT (ION) 35 mL/hr at 03/21/20 0300  . TPN ADULT (ION)     PRN Meds: acetaminophen **OR** acetaminophen, hydrALAZINE, HYDROcodone-acetaminophen, ondansetron **OR** ondansetron (ZOFRAN) IV  Allergies:    Allergies  Allergen Reactions  . Bee Venom Swelling  . Other Hives    ants    Social History:   Social History   Socioeconomic History  . Marital status: Married    Spouse name: Not on file  . Number of children: Not on file  . Years of education: Not on file  . Highest education level: Not on file  Occupational History  . Not on file  Tobacco Use  . Smoking status: Former Smoker    Years: 2.00    Types: Cigarettes    Quit date: 04/10/1969    Years since quitting: 50.9  . Smokeless tobacco: Never Used  Vaping Use  . Vaping Use: Never used  Substance and Sexual Activity  . Alcohol use: Yes    Comment: 2-3 shots and 1 beer daily  . Drug use: Yes    Frequency: 5.0 times per week    Types: Marijuana  . Sexual activity: Not on file  Other Topics Concern  . Not on file  Social History Narrative  . Not on file   Social Determinants of Health   Financial Resource Strain:   . Difficulty of Paying Living Expenses: Not on file  Food Insecurity:   . Worried About Charity fundraiser in the Last Year: Not on file  . Ran Out of Food in the Last Year: Not on file  Transportation Needs:   . Lack of Transportation  (Medical): Not on file  . Lack of Transportation (Non-Medical): Not on file  Physical Activity:   . Days of Exercise per Week: Not on file  . Minutes of Exercise per Session: Not on file  Stress:   . Feeling of Stress : Not on file  Social Connections:   . Frequency of Communication with Friends and Family: Not on file  . Frequency of Social Gatherings with Friends and Family: Not on file  . Attends Religious Services: Not on file  . Active Member of Clubs or Organizations: Not on file  . Attends Archivist Meetings: Not on file  . Marital Status: Not on file  Intimate Partner Violence:   . Fear of Current or Ex-Partner: Not on file  . Emotionally Abused: Not on file  . Physically Abused: Not on file  . Sexually Abused: Not on file  Family History:   History reviewed. No pertinent family history.   ROS:  Please see the history of present illness.   All other ROS reviewed and negative.     Physical Exam/Data:   Vitals:   03/21/20 1200 03/21/20 1300 03/21/20 1400 03/21/20 1500  BP: (!) 145/81 (!) 161/92 (!) 162/88 (!) 168/90  Pulse:  94  92  Resp: (!) 44 (!) 39 (!) 41 (!) 45  Temp:    98 F (36.7 C)  TempSrc:    Oral  SpO2:  95%  92%  Weight:      Height:        Intake/Output Summary (Last 24 hours) at 03/21/2020 1708 Last data filed at 03/21/2020 1118 Gross per 24 hour  Intake 2235.5 ml  Output 3430 ml  Net -1194.5 ml   Last 3 Weights 03/20/2020 04/10/2020 08/04/2019  Weight (lbs) 198 lb 6.6 oz 191 lb 185 lb  Weight (kg) 90 kg 86.637 kg 83.915 kg     Body mass index is 30.18 kg/m.  General:  Well nourished, well developed, mild distress HEENT: normal Lymph: no adenopathy Neck: no JVD Endocrine:  No thryomegaly Vascular: No carotid bruits; FA pulses 2+ bilaterally without bruits  Cardiac: Tachycardic, regular, no murmur Lungs:  clear to auscultation bilaterally, no wheezing, rhonchi or rales  Abd: soft, tender, ileostomy bag noted with greenish  stool Ext: no edema Musculoskeletal:  No deformities, BUE and BLE strength normal and equal Skin: warm and dry  Neuro:  CNs 2-12 intact, no focal abnormalities noted Psych:  Normal affect   EKG:  The EKG was personally reviewed and demonstrates: Sinus tachycardia, right bundle branch block, QTC 560 Telemetry:  Telemetry was personally reviewed and demonstrates: Sinus tachycardia  Relevant CV Studies: Echo 03/25/2020 1. Left ventricular ejection fraction, by estimation, is 35 to 40%. The  left ventricle has moderately decreased function. The left ventricle  demonstrates global hypokinesis. Left ventricular diastolic parameters are  consistent with Grade II diastolic  dysfunction (pseudonormalization).  2. Right ventricular systolic function is mildly reduced. The right  ventricular size is normal. Tricuspid regurgitation signal is inadequate  for assessing PA pressure.  3. The mitral valve is normal in structure. Mild mitral valve  regurgitation.   Laboratory Data:  High Sensitivity Troponin:   Recent Labs  Lab 03/24/2020 1226  TROPONINIHS 33*     Chemistry Recent Labs  Lab 03/19/20 0445 03/20/20 0606 03/21/20 0552  NA 154* 154* 147*  147*  K 4.4 3.7 3.3*  3.2*  CL 120* 116* 107  107  CO2 24 28 31  30   GLUCOSE 196* 189* 392*  387*  BUN 41* 39* 29*  28*  CREATININE 1.24 1.09 0.86  0.80  CALCIUM 7.5* 7.5* 7.5*  7.5*  GFRNONAA >60 >60 >60  >60  GFRAA >60 >60 >60  >60  ANIONGAP 10 10 9  10     Recent Labs  Lab 04/10/2020 0400 03/17/20 0500 03/19/20 0445 03/20/20 0606 03/21/20 0552  PROT 5.0*  --   --   --  5.7*  ALBUMIN 2.0*  2.1*   < > 2.0* 2.0* 2.3*  2.3*  AST 113*  --   --   --  79*  ALT 83*  --   --   --  177*  ALKPHOS 61  --   --   --  101  BILITOT 0.5  --   --   --  1.2   < > = values in this interval  not displayed.   Hematology Recent Labs  Lab 03/19/20 0445 03/20/20 0606 03/21/20 0552  WBC 12.0* 14.4* 14.3*  RBC 3.85* 4.20* 4.61  HGB  11.2* 12.4* 13.3  HCT 36.1* 38.9* 41.9  MCV 93.8 92.6 90.9  MCH 29.1 29.5 28.9  MCHC 31.0 31.9 31.7  RDW 14.2 14.1 14.1  PLT 94* 125* 161   BNPNo results for input(s): BNP, PROBNP in the last 168 hours.  DDimer No results for input(s): DDIMER in the last 168 hours.   Radiology/Studies:  DG Abd 1 View  Result Date: 03/18/2020 CLINICAL DATA:  OG tube placement EXAM: ABDOMEN - 1 VIEW COMPARISON:  None. FINDINGS: OG tube appears adequately positioned in the stomach. Presumed drainage catheters within the LEFT and RIGHT abdomen. Overall nonobstructive bowel gas pattern. IMPRESSION: OG tube adequately positioned in the stomach. Electronically Signed   By: Franki Cabot M.D.   On: 03/18/2020 18:45   DG Chest Port 1 View  Result Date: 03/21/2020 CLINICAL DATA:  Shortness of breath. EXAM: PORTABLE CHEST 1 VIEW COMPARISON:  March 14, 2020. FINDINGS: Stable cardiomediastinal silhouette. Nasogastric tube tip is seen in proximal stomach. Endotracheal tube has been removed. Right internal jugular catheter is unchanged in position. No pneumothorax is noted. Mild bibasilar subsegmental atelectasis is noted. Small left pleural effusion may be present. Bony thorax is unremarkable. IMPRESSION: Mild bibasilar subsegmental atelectasis. Small left pleural effusion may be present. Endotracheal tube has been removed. Electronically Signed   By: Marijo Conception M.D.   On: 03/21/2020 16:29        Assessment and Plan:   1. Abnormal EKG -QT prolongation secondary to right bundle branch block. -Modified QT measurement (QTm= QTbbb - 50% QRSbbb) is normal, taking into account RBBB. -Not much clinical significance of right bundle branch block. -Agree with repleting electrolytes including potassium. -No additional therapy indicated for "abnormal QT".  2. HFrEF EF 35-40% -Denies chest pain or shortness of breath -Denies previous cardiac disease -Patient is hypertensive -Start Coreg and losartan for now. -Can  pursue ischemic work-up after acute illness and if clinical course and goals of care permit.  Patient has newly diagnosed metastatic colon cancer, mets to lymph nodes.  3. Hypertension -Start coreg and losartan as above -titrate as bp permits taking into account underlying sepsis/intra-abdominal infection  4.  Perforated viscus status post ex lap with ileostomy bag -IV antibiotics -NG tube -Management as per surgical team  5.  Newly diagnosed colon cancer mets to lymph nodes -Management as per primary team and oncology    Signed, Kate Sable, MD  03/21/2020 5:08 PM

## 2020-03-22 DIAGNOSIS — I420 Dilated cardiomyopathy: Secondary | ICD-10-CM

## 2020-03-22 DIAGNOSIS — E1159 Type 2 diabetes mellitus with other circulatory complications: Secondary | ICD-10-CM

## 2020-03-22 LAB — GLUCOSE, CAPILLARY
Glucose-Capillary: 263 mg/dL — ABNORMAL HIGH (ref 70–99)
Glucose-Capillary: 272 mg/dL — ABNORMAL HIGH (ref 70–99)
Glucose-Capillary: 285 mg/dL — ABNORMAL HIGH (ref 70–99)
Glucose-Capillary: 285 mg/dL — ABNORMAL HIGH (ref 70–99)
Glucose-Capillary: 286 mg/dL — ABNORMAL HIGH (ref 70–99)
Glucose-Capillary: 289 mg/dL — ABNORMAL HIGH (ref 70–99)
Glucose-Capillary: 296 mg/dL — ABNORMAL HIGH (ref 70–99)
Glucose-Capillary: 300 mg/dL — ABNORMAL HIGH (ref 70–99)
Glucose-Capillary: 327 mg/dL — ABNORMAL HIGH (ref 70–99)

## 2020-03-22 LAB — COMPREHENSIVE METABOLIC PANEL
ALT: 117 U/L — ABNORMAL HIGH (ref 0–44)
AST: 43 U/L — ABNORMAL HIGH (ref 15–41)
Albumin: 2.4 g/dL — ABNORMAL LOW (ref 3.5–5.0)
Alkaline Phosphatase: 92 U/L (ref 38–126)
Anion gap: 13 (ref 5–15)
BUN: 28 mg/dL — ABNORMAL HIGH (ref 8–23)
CO2: 27 mmol/L (ref 22–32)
Calcium: 7.9 mg/dL — ABNORMAL LOW (ref 8.9–10.3)
Chloride: 108 mmol/L (ref 98–111)
Creatinine, Ser: 0.78 mg/dL (ref 0.61–1.24)
GFR calc Af Amer: >60 mL/min (ref >60)
GFR calc non Af Amer: >60 mL/min (ref >60)
Glucose, Bld: 338 mg/dL — ABNORMAL HIGH (ref 70–99)
Potassium: 3.3 mmol/L — ABNORMAL LOW (ref 3.5–5.1)
Sodium: 148 mmol/L — ABNORMAL HIGH (ref 135–145)
Total Bilirubin: 0.8 mg/dL (ref 0.3–1.2)
Total Protein: 5.8 g/dL — ABNORMAL LOW (ref 6.5–8.1)

## 2020-03-22 LAB — PHOSPHORUS: Phosphorus: 2.2 mg/dL — ABNORMAL LOW (ref 2.5–4.6)

## 2020-03-22 LAB — MAGNESIUM: Magnesium: 1.9 mg/dL (ref 1.7–2.4)

## 2020-03-22 MED ORDER — LOSARTAN POTASSIUM 25 MG PO TABS
25.0000 mg | ORAL_TABLET | Freq: Once | ORAL | Status: AC
  Administered 2020-03-22: 25 mg via ORAL

## 2020-03-22 MED ORDER — FREE WATER
30.0000 mL | Status: DC
  Administered 2020-03-22 – 2020-03-23 (×6): 30 mL

## 2020-03-22 MED ORDER — INSULIN GLARGINE 100 UNIT/ML ~~LOC~~ SOLN: 20.0000 [IU] | Freq: Two times a day (BID) | SUBCUTANEOUS | Status: DC

## 2020-03-22 MED ORDER — TRAVASOL 10 % IV SOLN
INTRAVENOUS | Status: AC
  Filled 2020-03-22: qty 1200

## 2020-03-22 MED ORDER — LOSARTAN POTASSIUM 50 MG PO TABS
50.0000 mg | ORAL_TABLET | Freq: Every day | ORAL | Status: DC
  Administered 2020-03-23 – 2020-03-25 (×3): 50 mg via ORAL
  Filled 2020-03-22 (×3): qty 1

## 2020-03-22 MED ORDER — SODIUM CHLORIDE 0.9 % IV SOLN
INTRAVENOUS | Status: DC | PRN
  Administered 2020-03-22: 10 mL via INTRAVENOUS
  Administered 2020-03-24: 500 mL via INTRAVENOUS

## 2020-03-22 MED ORDER — CARVEDILOL 12.5 MG PO TABS
12.5000 mg | ORAL_TABLET | Freq: Two times a day (BID) | ORAL | Status: DC
  Administered 2020-03-22 – 2020-03-25 (×7): 12.5 mg via ORAL
  Filled 2020-03-22 (×7): qty 1

## 2020-03-22 MED ORDER — POTASSIUM CHLORIDE 10 MEQ/100ML IV SOLN
10.0000 meq | INTRAVENOUS | Status: AC
  Administered 2020-03-22 (×3): 10 meq via INTRAVENOUS
  Filled 2020-03-22 (×3): qty 100

## 2020-03-22 MED ORDER — POTASSIUM PHOSPHATES 15 MMOLE/5ML IV SOLN
15.0000 mmol | Freq: Once | INTRAVENOUS | Status: AC
  Administered 2020-03-22: 15 mmol via INTRAVENOUS
  Filled 2020-03-22: qty 5

## 2020-03-22 MED ORDER — INSULIN GLARGINE 100 UNIT/ML ~~LOC~~ SOLN
16.0000 [IU] | Freq: Two times a day (BID) | SUBCUTANEOUS | Status: DC
  Administered 2020-03-22 – 2020-03-23 (×2): 16 [IU] via SUBCUTANEOUS
  Filled 2020-03-22 (×3): qty 0.16

## 2020-03-22 MED ORDER — TRAVASOL 10 % IV SOLN: INTRAVENOUS | Status: DC

## 2020-03-22 MED ORDER — PIVOT 1.5 CAL PO LIQD
1000.0000 mL | ORAL | Status: DC
  Administered 2020-03-22: 1000 mL
  Filled 2020-03-22: qty 1000

## 2020-03-22 NOTE — Progress Notes (Signed)
SLP Cancellation Note  Patient Details Name: Donald Stephenson. MRN: [id number] DOB: 1956/02/23   Cancelled treatment:       Reason Eval/Treat Not Completed: Medical issues which prohibited therapy   After reviewing pt's chart and discussing pt with MD Posey Pronto) and RD Myriam Jacobson) we have decided to continue progressing with tube feeds and assess if pt is able to tolerate appropriate feeds first. Once that is established, ST will be re-consult and assess for possible diet advancement.   Education provided to pt's nurse on oral care and also providing pt sips of currently prescribed diet to preserve swallow musculature.   [name] M.S., CCC-SLP, [place] Office [phone]    [name] 03/22/2020, 12:25 PM

## 2020-03-22 NOTE — Consult Note (Signed)
PHARMACY - TOTAL PARENTERAL NUTRITION CONSULT NOTE   Indication: Prolonged ileus  Patient Measurements: Height: 5' 7.99" (172.7 cm) Weight: 86.9 kg (191 lb 9.3 oz) IBW/kg (Calculated) : 68.38 TPN AdjBW (KG): 73 Body mass index is 29.14 kg/m.  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 Patient was intubated 9/1 - 9/5. Patient has been weaned off pressors. Pharmacy has been consulted to initiate TPN for prolonged ileus.   Glucose / Insulin: 222 - 322 (36u Novolog last 24h); patient was on IV steroids and will receive one more dose today. Expect BG to come down Electrolytes: Hypernatremia (154>147), hypophosphatemia (2.1>2.3) Renal: AKI resolved. Good UOP LFTs / TGs: Mild transaminitis 9/3. CMP ordered for tomorrow Prealbumin / albumin: Albumin of 2 on 03/20/20. Patient is on daily albumin 12.5g. Intake / Output; MIVF: Good UOP - net ~1.7L 9/7; D5W @50mL /hr GI Imaging:  9/5 abdominal X-ray: Overall nonobstructive bowel gas pattern 9/1 CT abdomen pelvis: Large amount of pneumoperitoneum indicative of bowel perforation  Surgeries / Procedures:  9/1 Subtotal colectomy 9/3 I&D of intra-abdominal abscess, ileostomy creation  Central access: Central line placed 9/1 TPN start date: 9/7  Nutritional Goals (per RD recommendation on 9/7): kCal: 2200-2400 / day, Protein: 115-125 g/day, Fluid: 2.2 - 2.4 L / day Goal TPN rate is 100 mL/hr (provides 120 g of protein and 2318 kcals per day)   Current Nutrition:  NPO  Plan:  --Advance TPN to goal rate of 100 mL/hr at 1800 --Electrolytes in TPN: 64mEq/L of Na, 89mEq/L of K, 33mEq/L of Ca, 70mEq/L of Mg, and 104mmol/L of Phos. Cl:Ac ratio 1:1  --Add standard MVI and trace elements to TPN  --Continue Moderate q4h SSI + Lantus 20u daily and adjust as needed - BG 280-330 Will add 25 units of insulin to  TPN. Pt received a total of 55 units of insulin aspart 9/8 --D'c D5W -- Phos 2.2 will give KPhos 15 mmol IV x 1 -- K+ 3.3 will give KCL IV 10 mEq x 3  - pt plan to start tube feeds today and advance tomorrow may decrease TPN rate tomorrow.  --Monitor TPN labs on Mon/Thurs; will check K, Mg, Phos daily x 3 days to monitor for re-feeding  Oswald Hillock, PharmD, BCPS 03/22/2020 8:49 AM

## 2020-03-22 NOTE — Plan of Care (Signed)
  Problem: Clinical Measurements: Goal: Will remain free from infection Outcome: Progressing Goal: Respiratory complications will improve Outcome: Progressing   Problem: Nutrition: Goal: Adequate nutrition will be maintained Outcome: Progressing   Problem: Pain Managment: Goal: General experience of comfort will improve Outcome: Progressing   Problem: Safety: Goal: Ability to remain free from injury will improve Outcome: Progressing   

## 2020-03-22 NOTE — Progress Notes (Signed)
Occupational Therapy Treatment Patient Details Name: Donald Stephenson. MRN: [id number] DOB: 20-May-1956 Today's Date: 03/22/2020    History of present illness Per MD notes: Pt is a 64 yo male who came in for abdominal pain and lethargy, found to be in shock and had CXR with air under the diaphragm. CT abd with bowel perforation, patient taken to OR emergently found to have 700cc mucopurulent material now s/p surgery with resection and post operative septic shock.  MD assessement includes: Severe ACUTE Hypoxic and Hypercapnic Respiratory Failure due to severe septic shock from perforated viscus, AKI/renal failure, intubated initially now extubated, and acute toxic metabolic encephalopathy.   OT comments  Mr. Edwyna Ready" Parlee seen for OT treatment on this date. Upon arrival to room pt awake alert, semi-supine in bed with wife present at bedside. Pt denies pain and agreeable to OT tx session. Pt assisted with re-positioning as he is noted with L lateral lean with his head toward the L this date. OT educates pt and spouse on importance of positioning in neutral to support safety, skin integrity, and comfort. OT facilitates ADLs and therapeutic exercises as described below. Pt washes face given MAX A from therapist to bring BUE up, and requires cueing for thoroughness. He demonstrates improved functional use of his LUE vs his RUE this date with RUE notably edematous as compared to LUE. Pt denies pain with activity. Pt/caregiver educated on importance of bed level ther-ex to support function during ADL management. See ADL section for additional details. Pt making good progress toward goals. He continues to benefit from skilled OT services to maximize return to PLOF and minimize risk of future falls, injury, caregiver burden, and readmission. Will continue to follow POC as written. Discharge recommendation remains appropriate.    Follow Up Recommendations  SNF;Supervision/Assistance - 24 hour    Equipment  Recommendations  Other (comment) (Defer to next venue of care)    Recommendations for Other Services      Precautions / Restrictions Precautions Precautions: Fall Restrictions Weight Bearing Restrictions: No Other Position/Activity Restrictions: Colostomy and JP drain RLQ, JP drain LLQ       Mobility Bed Mobility Overal bed mobility: Needs Assistance             General bed mobility comments: Pt assisted with sitting upright (long sitting in bed) x2 this date he requies MAX A and is able to grasp bed rail to assist. Does not maintain for >5 seconds, just long enough for skin check and to adjust pillow.  Transfers                 General transfer comment: Unable/unsafe to attempt    Balance Overall balance assessment: Needs assistance Sitting-balance support: Bilateral upper extremity supported Sitting balance-Leahy Scale: Zero         Standing balance comment: Unable/unsafe to attempt                           ADL either performed or assessed with clinical judgement   ADL Overall ADL's : Needs assistance/impaired Eating/Feeding: NPO Eating/Feeding Details (indicate cue type and reason): NG tube Grooming: Wash/dry face;Maximal assistance;Cueing for safety;Bed level;Oral care;Cueing for sequencing Grooming Details (indicate cue type and reason): Pt washes face given MAX A from therapist to bring BUE up, and requires cueing for thoroughness. Oral care performed using oral swab to simulate toothbrushing. Pt noted to chew on sponge for extended time, states it soothes gums, Pt instructed on safety  and OT supervises t/o to ensure safety. No s/s of aspiration, full sponge intact at end of oral care session and disposed of appropriately.                             Functional mobility during ADLs: +2 for safety/equipment;+2 for physical assistance General ADL Comments: Pt continues to require MAX-TOTAL A for bed-level ADL management 2/2  generalized weakness & decreased activity tolerance with high medical acuity.     Vision Baseline Vision/History: Wears glasses Wears Glasses: Reading only Patient Visual Report: No change from baseline     Perception     Praxis      Cognition Arousal/Alertness: Lethargic Behavior During Therapy: Flat affect Overall Cognitive Status: Within Functional Limits for tasks assessed                                 General Comments: Pt generally oriented to self and situation. Is able to answer most question appropriately. Occasionally requires cueing to complete 1 step VCs.        Exercises Other Exercises Other Exercises: OT facilitates BUE ther-ex (PROM for RUE, AAROM for LUE) including wrist flexion/extension, elbow flexion/extension, & shoulder flexion to 90. Pt educated on importance of bed level ther-ex to support functional ADL management. Pt also assisted with bed level grooming tasks including oral care and face washing. See ADL section for additional detail.   Shoulder Instructions       General Comments Pt noted to be tachypneic t/o session, per room monitor. At end of session he is also noted with light smudges of blood across bottom 3rd of his pillow. RN notified/aware. Per nsg student in room pt has small sore on head that was bleeding earlier in the day.    Pertinent Vitals/ Pain       Pain Assessment: No/denies pain Pain Intervention(s): Monitored during session;Repositioned  Home Living                                          Prior Functioning/Environment              Frequency  Min 1X/week        Progress Toward Goals  OT Goals(current goals can now be found in the care plan section)  Progress towards OT goals: Progressing toward goals  Acute Rehab OT Goals Patient Stated Goal: To get stronger OT Goal Formulation: With patient/family Time For Goal Achievement: 04/03/20 Potential to Achieve Goals: [place]  Discharge plan remains appropriate;Frequency remains appropriate    Co-evaluation                 AM-PAC OT "6 Clicks" Daily Activity     Outcome Measure   Help from another person eating meals?: A Lot (based on functional assessment only, pt currently NPO with ng tube) Help from another person taking care of personal grooming?: A Lot Help from another person toileting, which includes using toliet, bedpan, or urinal?: Total Help from another person bathing (including washing, rinsing, drying)?: A Lot Help from another person to put on and taking off regular upper body clothing?: A Lot Help from another person to put on and taking off regular lower body clothing?: Total 6 Click Score: 10    End of Session  OT Visit Diagnosis: Muscle weakness (generalized) (M62.81)   Activity Tolerance Patient limited by fatigue;Patient limited by lethargy   Patient Left in bed;with call bell/phone within reach;with bed alarm set;with family/visitor present;Other (comment) (With nsg students in room.)   Nurse Communication Mobility status        Time: 1327-1400 OT Time Calculation (min): 33 min  Charges: OT General Charges $OT Visit: 1 Visit OT Treatments $Self Care/Home Management : 8-22 mins $Therapeutic Exercise: 8-22 mins   Shara Blazing, M.S., OTR/L Ascom: [phone] 03/22/20, 3:52 PM

## 2020-03-22 NOTE — Progress Notes (Signed)
Progress Note  Patient Name: Donald Stephenson. Date of Encounter: 03/22/2020  [facility] Cardiologist: [facility]-  Dr. Garen Lah consulted  Subjective   Denies having significant pain Has NG in place for feeding and medications Denies shortness of breath On ceftriaxone, TPN, maintenance fluids Lovenox for DVT prophylaxis  Inpatient Medications    Scheduled Meds: . carvedilol  6.25 mg Oral BID WC  . chlorhexidine  15 mL Mouth Rinse BID  . Chlorhexidine Gluconate Cloth  6 each Topical Daily  . enoxaparin (LOVENOX) injection  40 mg Subcutaneous Q24H  . feeding supplement (PIVOT 1.5 CAL)  1,000 mL Per Tube Q24H  . free water  30 mL Per Tube Q4H  . insulin aspart  0-20 Units Subcutaneous Q4H  . insulin glargine  20 Units Subcutaneous Daily  . losartan  25 mg Oral Daily  . mouth rinse  15 mL Mouth Rinse q12n4p  .  morphine injection  0.5 mg Intravenous Once  . pantoprazole (PROTONIX) IV  40 mg Intravenous QHS   Continuous Infusions: . sodium chloride Stopped (03/22/20 0418)  . anidulafungin 100 mg (03/21/20 1650)  . cefTRIAXone (ROCEPHIN)  IV 2 g (03/22/20 1138)  . metronidazole 500 mg (03/22/20 1021)  . potassium PHOSPHATE IVPB (in mmol)    . TPN ADULT (ION) 70 mL/hr at 03/21/20 1819  . TPN ADULT (ION)     PRN Meds: sodium chloride, acetaminophen **OR** acetaminophen, hydrALAZINE, HYDROcodone-acetaminophen, ondansetron **OR** ondansetron (ZOFRAN) IV   Vital Signs    Vitals:   03/22/20 0651 03/22/20 0758 03/22/20 1142 03/22/20 1200  BP:  (!) 161/92 (!) 156/96   Pulse:  97 98   Resp:  (!) 24 (!) 28 (!) 24  Temp:  99.2 F (37.3 C) 98.8 F (37.1 C)   TempSrc:  Axillary Axillary   SpO2:  99% 98%   Weight: 86.9 kg     Height:        Intake/Output Summary (Last 24 hours) at 03/22/2020 1456 Last data filed at 03/22/2020 1448 Gross per 24 hour  Intake 1253.52 ml  Output 3935 ml  Net -2681.48 ml   Last 3 Weights 03/22/2020 03/20/2020 03/31/2020  Weight (lbs) 191 lb 9.3  oz 198 lb 6.6 oz 191 lb  Weight (kg) 86.9 kg 90 kg 86.637 kg      Telemetry    Normal sinus rhythm- Personally Reviewed  ECG     - Personally Reviewed  Physical Exam   GEN laying supine in bed, NG tube in place Neck:  Unable to estimate JVD Cardiac: RRR, no murmurs, rubs, or gallops.  Respiratory: Clear to auscultation bilaterally.  Scattered Rale GI: Soft, nontender, non-distended  MS: No edema; No deformity. Neuro:  Nonfocal  Psych: Normal affect   Labs    High Sensitivity Troponin:   Recent Labs  Lab 03/23/2020 1226 03/21/20 1642 03/21/20 1843  TROPONINIHS 33* 146* 142*      Chemistry Recent Labs  Lab 03/24/2020 0400 03/17/20 0500 03/20/20 0606 03/20/20 0606 03/21/20 0552 03/21/20 1647 03/22/20 0704  NA 145   < > 154*  --  147*  147*  --  148*  K 5.0   < > 3.7   < > 3.3*  3.2* 3.5 3.3*  CL 113*   < > 116*  --  107  107  --  108  CO2 23   < > 28  --  31  30  --  27  GLUCOSE 135*   < > 189*  --  392*  387*  --  338*  BUN 34*   < > 39*  --  29*  28*  --  28*  CREATININE 2.16*   < > 1.09  --  0.86  0.80  --  0.78  CALCIUM 6.6*   < > 7.5*  --  7.5*  7.5*  --  7.9*  PROT 5.0*  --   --   --  5.7*  --  5.8*  ALBUMIN 2.0*  2.1*   < > 2.0*  --  2.3*  2.3*  --  2.4*  AST 113*  --   --   --  79*  --  43*  ALT 83*  --   --   --  177*  --  117*  ALKPHOS 61  --   --   --  101  --  92  BILITOT 0.5  --   --   --  1.2  --  0.8  GFRNONAA 31*   < > >60  --  >60  >60  --  >60  GFRAA 36*   < > >60  --  >60  >60  --  >60  ANIONGAP 9   < > 10  --  9  10  --  13   < > = values in this interval not displayed.     Hematology Recent Labs  Lab 03/19/20 0445 03/20/20 0606 03/21/20 0552  WBC 12.0* 14.4* 14.3*  RBC 3.85* 4.20* 4.61  HGB 11.2* 12.4* 13.3  HCT 36.1* 38.9* 41.9  MCV 93.8 92.6 90.9  MCH 29.1 29.5 28.9  MCHC 31.0 31.9 31.7  RDW 14.2 14.1 14.1  PLT 94* 125* 161    BNP Recent Labs  Lab 03/21/20 2129  BNP 611.8*     DDimer No results for  input(s): DDIMER in the last 168 hours.   Radiology    DG Chest Port 1 View  Result Date: 03/21/2020 CLINICAL DATA:  Shortness of breath. EXAM: PORTABLE CHEST 1 VIEW COMPARISON:  March 14, 2020. FINDINGS: Stable cardiomediastinal silhouette. Nasogastric tube tip is seen in proximal stomach. Endotracheal tube has been removed. Right internal jugular catheter is unchanged in position. No pneumothorax is noted. Mild bibasilar subsegmental atelectasis is noted. Small left pleural effusion may be present. Bony thorax is unremarkable. IMPRESSION: Mild bibasilar subsegmental atelectasis. Small left pleural effusion may be present. Endotracheal tube has been removed. Electronically Signed   By: Marijo Conception M.D.   On: 03/21/2020 16:29    Cardiac Studies     Patient Profile     64 y.o. 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, postop day 8 status post subtotal colectomy and interval reopening of recent laparotomy with drainage of intra-abdominal abscess and ileostomy creation postop day 6 Respiratory failure mechanical ventilation due to hypoxia, septic shock, acute renal failure, diabetes, hypertension, malignant neoplasm splenic flexure Cardiology consult your arrhythmia, prolonged QT  Assessment & Plan    Abnormal EKG Normal sinus rhythm, right bundle branch block, QTc within normal limits Agree with repeating electrolytes  Cardiomyopathy Ejection fraction 35 to 40%, On carvedilol, losartan, absorption questionable Given high blood pressure will increase doses of losartan and carvedilol If he continues to run high, may need IV or Nitropaste until GI pathology improves  Essential hypertension We will increase losartan and carvedilol as above  Cecum perforation,: Obstructive mass, malignant neoplasm splenic flexure Recent surgery, followed by surgical team On nasal gastric feeds   Total encounter time  more than 25 minutes  Greater than 50% was spent in  counseling and coordination of care with the patient   For questions or updates, please contact [place] Please consult [email or web address] for contact info under        Signed, Ida Rogue, MD  03/22/2020, 2:56 PM

## 2020-03-22 NOTE — Progress Notes (Signed)
[place] Hospital Day(s): 8.   Post op day(s): 6 Days Post-Op.   Interval History: Patient seen and examined, no acute events or new complaints overnight.  Patient continues to be alert in no distress.  There was no clinical deterioration yesterday.  There was no progress either because patient did not have any physical therapy or any swallowing evaluation.  Vital signs in last 24 hours: [min-max] current  Temp:  [98 F (36.7 C)-100 F (37.8 C)] 99.2 F (37.3 C) (09/09 0758) Pulse Rate:  [88-101] 97 (09/09 0758) Resp:  [24-47] 24 (09/09 0758) BP: (145-168)/(80-94) 161/92 (09/09 0758) SpO2:  [90 %-100 %] 99 % (09/09 0758) Weight:  [86.9 kg] 86.9 kg (09/09 0651)     Height: 5' 7.99" (172.7 cm) Weight: 86.9 kg BMI (Calculated): 29.14   Physical Exam:  Constitutional: alert, cooperative and no distress  Respiratory: breathing non-labored at rest  Cardiovascular: regular rate and sinus rhythm  Gastrointestinal: soft, non-tender, and non-distended.  Ileostomy pink and patent with stool in bag.  Labs:  CBC Latest Ref Rng & Units 03/21/2020 03/20/2020 03/19/2020  WBC 4.0 - 10.5 K/uL 14.3(H) 14.4(H) 12.0(H)  Hemoglobin 13.0 - 17.0 g/dL 13.3 12.4(L) 11.2(L)  Hematocrit 39 - 52 % 41.9 38.9(L) 36.1(L)  Platelets 150 - 400 K/uL 161 125(L) 94(L)   CMP Latest Ref Rng & Units 03/22/2020 03/21/2020 03/21/2020  Glucose 70 - 99 mg/dL 338(H) - 392(H)  BUN 8 - 23 mg/dL 28(H) - 29(H)  Creatinine 0.61 - 1.24 mg/dL 0.78 - 0.86  Sodium 135 - 145 mmol/L 148(H) - 147(H)  Potassium 3.5 - 5.1 mmol/L 3.3(L) 3.5 3.3(L)  Chloride 98 - 111 mmol/L 108 - 107  CO2 22 - 32 mmol/L 27 - 31  Calcium 8.9 - 10.3 mg/dL 7.9(L) - 7.5(L)  Total Protein 6.5 - 8.1 g/dL 5.8(L) - 5.7(L)  Total Bilirubin 0.3 - 1.2 mg/dL 0.8 - 1.2  Alkaline Phos 38 - 126 U/L 92 - 101  AST 15 - 41 U/L 43(H) - 79(H)  ALT 0 - 44 U/L 117(H) - 177(H)    Imaging studies: No new pertinent imaging studies   Assessment/Plan:  64  y.o.malewith descending colon obstructive mass with cecum perforation8Day Post-Ops/p subtotal colectomyand interval reopening of recent laparotomy with drainage of intra-abdominal abscess and ileostomy creation postoperative day #6, complicated by pertinent comorbidities including, respiratory failure mechanical ventilation due to hypoxia (resolved),septic shock (resolved), acute kidney injury (resolved), diabetes, hypertension, and newly diagnosed malignant neoplasm of the splenic flexure.  Patient without any clinical deterioration.  Patient continue afebrile.  There is no tachycardia.  Today with persistent stool in the bag consistent with adequate GI function.  Yesterday I recommended swallow evaluation which was not done.  Due to the patient being so debilitated and high risk for aspiration I would recommend to start tube feedings through the NG until appropriate swallow evaluation can be done.  Patient also did not receive physical therapy yesterday because he was going to have a swallow evaluation which he did not have.  Patient with severe deconditioning.  Physical therapy will highly benefit this patient.  Patient also found with cardiac diastolic dysfunction.  Appreciate cardiology's evaluation, recommendation and management.  Patient will continue with negative pressure dressing in place.  I will change negative pressure dressing tomorrow.  From surgical standpoint there is no contraindication for physical therapy.  No contraindication for DVT prophylaxis.  Arnold Long, MD

## 2020-03-22 NOTE — Progress Notes (Signed)
Spoke with MD about po meds that would need to be given per NG, but NG is at LIS at this time. Will hold meds per NG until patient is transitioned to TF and meds can be given crushed through the NG. MD will change parameters for hydralazine from systolic BP >403 to >979 since Coreg and Cozaar are being held this AM.  Bridgette Habermann DNP,  [facility] Nursing Instructor.

## 2020-03-22 NOTE — Progress Notes (Addendum)
Donald Stephenson at [place] NAME: Donald Stephenson    MR#:  [id number]  DATE OF BIRTH:  03/24/1956  SUBJECTIVE:  awake alert. Answers questions appropriately. Slow to respond. Denies any abdominal pain. Currently has NG tube with tube feeding started. Has TPN going well.  REVIEW OF SYSTEMS:   Review of Systems  Constitutional: Negative for chills, fever and weight loss.  HENT: Negative for ear discharge, ear pain and nosebleeds.   Eyes: Negative for blurred vision, pain and discharge.  Respiratory: Negative for sputum production, shortness of breath, wheezing and stridor.   Cardiovascular: Negative for chest pain, palpitations, orthopnea and PND.  Gastrointestinal: Negative for abdominal pain, diarrhea, nausea and vomiting.  Genitourinary: Negative for frequency and urgency.  Musculoskeletal: Negative for back pain and joint pain.  Neurological: Negative for sensory change, speech change, focal weakness and weakness.  Psychiatric/Behavioral: Negative for depression and hallucinations. The patient is not nervous/anxious.    Tolerating Diet:tube feeding Tolerating PT: pending  DRUG ALLERGIES:   Allergies  Allergen Reactions  . Bee Venom Swelling  . Other Hives    ants    VITALS:  Blood pressure (!) 139/91, pulse (!) 104, temperature 98.6 F (37 C), temperature source Axillary, resp. rate (!) 26, height 5' 7.99" (1.727 m), weight 86.9 kg, SpO2 98 %.  PHYSICAL EXAMINATION:   Physical Exam  GENERAL:  64 y.o.-year-old patient lying in the bed with no acute distress. Chronically critically ill  EYES: Pupils equal, round, reactive to light and accommodation. No scleral icterus. NG tube+ HEENT: Head atraumatic, normocephalic. Oropharynx and nasopharynx clear.  NECK:  Supple, no jugular venous distention. No thyroid enlargement, no tenderness. Right IJ + LUNGS: Normal breath sounds bilaterally, no wheezing, rales, rhonchi. No use of accessory  muscles of respiration.  CARDIOVASCULAR: S1, S2 normal. No murmurs, rubs, or gallops.  ABDOMEN: colostomy plus. Midline surgical scar present with drains+ EXTREMITIES: No cyanosis, clubbing or edema b/l.    NEUROLOGIC: moves all extremities well.  PSYCHIATRIC:  patient is alert and awake SKIN Pressure Injury 03/19/20 Coccyx Right;Lateral;Lower Stage 2 -  Partial thickness loss of dermis presenting as a shallow open injury with a red, pink wound bed without slough. 5cm by 5cm peeled dermis area shallow open ulcer with red pink wound bed (Active)  03/19/20 1800  Location: Coccyx  Location Orientation: Right;Lateral;Lower  Staging: Stage 2 -  Partial thickness loss of dermis presenting as a shallow open injury with a red, pink wound bed without slough.  Wound Description (Comments): 5cm by 5cm peeled dermis area shallow open ulcer with red pink wound bed  Present on Admission: No     Pressure Injury 03/19/20 Coccyx Left;Medial;Mid Stage 2 -  Partial thickness loss of dermis presenting as a shallow open injury with a red, pink wound bed without slough. Intact dark serum filled blister area. (Active)  03/19/20 1800  Location: Coccyx  Location Orientation: Left;Medial;Mid  Staging: Stage 2 -  Partial thickness loss of dermis presenting as a shallow open injury with a red, pink wound bed without slough.  Wound Description (Comments): Intact dark serum filled blister area.  Present on Admission: No       LABORATORY PANEL:  CBC Recent Labs  Lab 03/21/20 0552  WBC 14.3*  HGB 13.3  HCT 41.9  PLT 161    Chemistries  Recent Labs  Lab 03/22/20 0704  NA 148*  K 3.3*  CL 108  CO2 27  GLUCOSE 338*  BUN 28*  CREATININE 0.78  CALCIUM 7.9*  MG 1.9  AST 43*  ALT 117*  ALKPHOS 92  BILITOT 0.8   Cardiac Enzymes No results for input(s): TROPONINI in the last 168 hours. RADIOLOGY:  DG Chest Port 1 View  Result Date: 03/21/2020 CLINICAL DATA:  Shortness of breath. EXAM: PORTABLE  CHEST 1 VIEW COMPARISON:  March 14, 2020. FINDINGS: Stable cardiomediastinal silhouette. Nasogastric tube tip is seen in proximal stomach. Endotracheal tube has been removed. Right internal jugular catheter is unchanged in position. No pneumothorax is noted. Mild bibasilar subsegmental atelectasis is noted. Small left pleural effusion may be present. Bony thorax is unremarkable. IMPRESSION: Mild bibasilar subsegmental atelectasis. Small left pleural effusion may be present. Endotracheal tube has been removed. Electronically Signed   By: Marijo Conception M.D.   On: 03/21/2020 16:29   ASSESSMENT AND PLAN:  Mr. Mcclenton is a 64 y.o. M with diabetes, hypertension who presented with abdominal pain.   In the ER, found to have air under diaphragm, taken emergently to OR for ex-lap, found to have perforated viscus, underwent colectomy, returned to ICU intubated and with open abdomen.  pt was transferred out of ICU 03/20/20  Septic shock due to perforated colon Acute respiratory failrue with hypoxia due to septic shock S/p ex-lap, subtotal colectomy and negative pressure dressing by Dr. Peyton Najjar 9/1 S/p small bowel resection, replaced negative pressure dressing, I&D, and ileostomy creation by Dr. Peyton Najjar 9/3 Patient presented with tachycardia, tachypnea, AMS, and lactate 7 mmol/L, found to have free air in abdomen, taken for emergent ex-lap. -Taken back to ICU intubated with negative pressure dressing.  Repeat ex-lap 9/3 with ileostomy creation. -Extubated 9/6 -transferred out of ICU 9/7 -abdominal wound  cultures growing only Bacteroides and sensitive GNRs. -Given severity of infection, will continue anidulafungin 7 total days empirically then stop -Narrow to Flagyl, ceftriaxone -discontinue Cortef after today's dose -TPN per Surgery -9/9--ok to start TUBE feeding at slow rate per surgery  Newly diagnosed metastatic adenoCA of colon -COnsult Oncology, appreciate cares -- patient seen by Dr.  Rogue Bussing.  Prolonged QTC noted on EKG -potassium 3.2 -pharmacy consult placed for replacement  -[facility] cardiology input appreciated  Diabetes type 2 with hyperglycemia without long term insulin Glucoses elevated -increase Lantus to Bid for better sugar contrl -Hold Jardiance, metformin, glimepiride  Cardiomyopathy Ejection fraction 35 to 40%, On carvedilol, losartan Given high blood pressure will increase doses of losartan and carvedilol  Hypertension -continue Coreg losartan  Pressure injury, stage II coccyx, not POA continue wound management  Acute renal failure due to septic shock Resolved   GI protection -Continue PPI  Hypernatremia -Start hypotonic fluids-- D5 water  Thrombocytopenia Mild, resolving, no bleeding  Foley removed 9/8   Disposition: Status is: Inpatient  Remains inpatient appropriate because:Inpatient level of care appropriate due to severity of illness   Dispo: The patient is from: Home  Anticipated d/c is to: SNF  Anticipated d/c date is: > 3 days  Patient currently is not medically stable to d/c. Patient admitted with perforated colon.  Now s/p ex-lab and ostomy.  Started on TPN  patient is started on tube feeding. physical therapy to see TOC for discharge planning-- very slow improvement.  Spoke with wife Ms Paye today and updated her    TOTAL TIME TAKING CARE OF THIS PATIENT: *25* minutes.  >50% time spent on counselling and coordination of care  Note: This dictation was prepared with Dragon dictation along with smaller phrase technology. Any transcriptional errors  that result from this process are unintentional.  Fritzi Mandes M.D    [facility]   CC: Primary care physician; Donnamarie Rossetti, PA-CPatient ID: Donald Pippins., male   DOB: 04-24-1956, 64 y.o.   MRN: [id number]

## 2020-03-22 NOTE — Progress Notes (Signed)
Nutrition Follow-Up Note   DOCUMENTATION CODES:   Not applicable  INTERVENTION:   TPN per pharmacy  Initiate Pivot 1.5 '@20ml' /hr via NGT   Once pt is tolerating tube feeds and ready for advancement, recommend:  Pivot 1.5 '@60ml' /hr- Initiate at 69m/hr and increase by 171mhr q 8 hours until goal rate is reached.   Free water flushes 3075m4 hours to maintain tube patency   Regimen provides 2160kcal/day, 135g/day protein and 1273m75my  Recommend Juven Fruit Punch BID via tube, each serving provides 95kcal and 2.5g of protein (amino acids glutamine and arginine)  NUTRITION DIAGNOSIS:   Inadequate oral intake related to inability to eat as evidenced by NPO status.  GOAL:   Patient will meet greater than or equal to 90% of their needs -met with TPN   MONITOR:   Labs, Weight trends, TF tolerance, Skin, I & O's, TPN  ASSESSMENT:   64 y71r old male with PMHx of DM, HTN, asthma, arthritis admitted with descending colon obstructive mass with cecum perforation s/p exploratory laparotomy, subtotal colectomy, negative pressure dressing placement on 9/1.   Pt tolerating TPN; plan is to advance to goal rate today. Pt is refeeding; electrolytes being managed by pharmacy. Pt with hyperglycemia; plan is to add 25 units of insulin to TPN. Pt received a total of 55 units of insulin 9/8. NGT to LIS. Will initiate trickle tube feeds today via NGT and advance as tolerated. SLP evaluation pending. Per chart, pt is weight stable since admit.   Drains with 60ml72m   Medications reviewed and include: lovenox, insulin, protonix, ceftriaxone, metronidazole, KCl  Labs reviewed: Na 148(H), BUN 28(H), P 2.2(L), Mg 1.9 wnl, AST 43(H), ALT 117(H) BNP- 611.8(H) Triglycerides 197(H)- 9/8 Wbc- 14.3(H) cbgs- 263, 296, 285, 300 x 24hrs AIC 7.8(H)- 9/2  Diet Order:   Diet Order            Diet clear liquid Room service appropriate? Yes; Fluid consistency: Thin  Diet effective now                 EDUCATION NEEDS:   No education needs have been identified at this time  Skin:  Skin Assessment: Skin Integrity Issues: Skin Integrity Issues:: Stage II Stage II: coccyx (5cm x 5cm) Incisions: closed incision to abdomen with wound VAC  Last BM:  9/9- small amount via ostomy  Height:   Ht Readings from Last 1 Encounters:  03/17/20 5' 7.99" (1.727 m)   Weight:   Wt Readings from Last 1 Encounters:  03/22/20 86.9 kg   Ideal Body Weight:  70 kg  BMI:  Body mass index is 29.14 kg/m.  Estimated Nutritional Needs:   Kcal:  2200-2400  Protein:  115-125 grams  Fluid:  2.2-2.5 L/day  CaseyKoleen DistanceRD, LDN Please refer to [facility] CenterRD and/or RD on-call/weekend/after hours pager

## 2020-03-22 NOTE — Progress Notes (Addendum)
Staff notice that pt pillow has a small stained of blood (not oozing). On assessment pt was talking with no complaints of headache or pain anywhere. Notify NP Randol Kern. Will continue to monitor.  Update 0456: NP morrison states to just monitor at this time and document on pt chart. Will notify incoming shift. Will continue to monitor.

## 2020-03-23 ENCOUNTER — Encounter: Payer: Self-pay | Admitting: General Surgery

## 2020-03-23 LAB — RENAL FUNCTION PANEL
Albumin: 2.2 g/dL — ABNORMAL LOW (ref 3.5–5.0)
Anion gap: 9 (ref 5–15)
BUN: 34 mg/dL — ABNORMAL HIGH (ref 8–23)
CO2: 29 mmol/L (ref 22–32)
Calcium: 8 mg/dL — ABNORMAL LOW (ref 8.9–10.3)
Chloride: 112 mmol/L — ABNORMAL HIGH (ref 98–111)
Creatinine, Ser: 0.91 mg/dL (ref 0.61–1.24)
GFR calc Af Amer: >60 mL/min (ref >60)
GFR calc non Af Amer: >60 mL/min (ref >60)
Glucose, Bld: 365 mg/dL — ABNORMAL HIGH (ref 70–99)
Phosphorus: 3 mg/dL (ref 2.5–4.6)
Potassium: 4.3 mmol/L (ref 3.5–5.1)
Sodium: 150 mmol/L — ABNORMAL HIGH (ref 135–145)

## 2020-03-23 LAB — GLUCOSE, CAPILLARY
Glucose-Capillary: 276 mg/dL — ABNORMAL HIGH (ref 70–99)
Glucose-Capillary: 292 mg/dL — ABNORMAL HIGH (ref 70–99)
Glucose-Capillary: 283 mg/dL — ABNORMAL HIGH (ref 70–99)
Glucose-Capillary: 315 mg/dL — ABNORMAL HIGH (ref 70–99)
Glucose-Capillary: 267 mg/dL — ABNORMAL HIGH (ref 70–99)
Glucose-Capillary: 262 mg/dL — ABNORMAL HIGH (ref 70–99)

## 2020-03-23 LAB — SURGICAL PATHOLOGY

## 2020-03-23 LAB — MAGNESIUM: Magnesium: 2.2 mg/dL (ref 1.7–2.4)

## 2020-03-23 MED ORDER — INSULIN GLARGINE 100 UNIT/ML ~~LOC~~ SOLN
18.0000 [IU] | Freq: Two times a day (BID) | SUBCUTANEOUS | Status: DC
  Administered 2020-03-23 – 2020-03-24 (×2): 18 [IU] via SUBCUTANEOUS
  Filled 2020-03-23 (×3): qty 0.18

## 2020-03-23 MED ORDER — JUVEN PO PACK
1.0000 | PACK | Freq: Two times a day (BID) | ORAL | Status: DC
  Administered 2020-03-25 (×2): 1

## 2020-03-23 MED ORDER — PIVOT 1.5 CAL PO LIQD
1000.0000 mL | ORAL | Status: DC
  Administered 2020-03-23 – 2020-03-25 (×3): 1000 mL
  Filled 2020-03-23: qty 1000

## 2020-03-23 MED ORDER — TRAVASOL 10 % IV SOLN
INTRAVENOUS | Status: AC
  Filled 2020-03-23: qty 600

## 2020-03-23 MED ORDER — FREE WATER
150.0000 mL | Status: DC
  Administered 2020-03-23 – 2020-03-25 (×15): 150 mL

## 2020-03-23 NOTE — Progress Notes (Signed)
Physical Therapy Treatment Patient Details Name: Donald Stephenson. MRN: [id number] DOB: Jun 01, 1956 Today's Date: 03/23/2020    History of Present Illness Per MD notes: Pt is a 64 yo male who came in for abdominal pain and lethargy, found to be in shock and had CXR with air under the diaphragm. CT abd with bowel perforation, patient taken to OR emergently found to have 700cc mucopurulent material now s/p surgery with resection and post operative septic shock.  MD assessement includes: Severe ACUTE Hypoxic and Hypercapnic Respiratory Failure due to severe septic shock from perforated viscus, AKI/renal failure, intubated initially now extubated, and acute toxic metabolic encephalopathy.    PT Comments    Pt was long sitting in bed with RN x 2 in room giving pt's medication. Pt has flat affect but is agreeable to session and cooperative throughout. Requires increased time to process but was able to follow simple one step commands. Cardiologist (in room) and RN cleared pt to participate even with elevated HR/RR. +2 assist for safety throughout session. Pt rolled to R side with max assist however required total assist to safely achieve EOB sitting. Once seated EOB. Pt required constant Mod assist of one to maintaining sitting. He tolerated well and was able to tolerate several exercises prior to endorsing fatigue and requesting to return to supine. Total assist to return to supine and reposition in bed. With Md's consent, ordered PRAFO boots to prevent ankle contractures. Placed on BLEs with ankles in neutral position. Pt was in bed with bed alarm set and call bell in reach at conclusion of session. Currently recommending SNF at [place] to address deficits and improve pt's safe functional abilities. Pt will need extensive PT going forward to return to PLOF.         Follow Up Recommendations  SNF;Other (comment) (If pt improves greatly, may benefit from CIR. )     Equipment Recommendations  Other (comment)  (defer to next level of care)    Recommendations for Other Services       Precautions / Restrictions Precautions Precautions: Fall Restrictions Other Position/Activity Restrictions: Colostomy and JP drain RLQ, JP drain LLQ, abdominal wound vac    Mobility  Bed Mobility Overal bed mobility: Needs Assistance Bed Mobility: Rolling Rolling: Max assist;Total assist (pt does have active movements and able to attempt to assist) Sidelying to sit: Total assist;+2 for safety/equipment (2nd person for safety. max vsc and assist of one)     Sit to sidelying: Total assist;+2 for safety/equipment (totyal assist of one with 2nd person SBA for safety) General bed mobility comments: Pt performed rolling R to short sit with increased time and max vcs. Max assist to roll to side but total assistto achieve EOB short sit. total assist to return and repositioned back in bed after sitting EOB a ~ 10 minutes.  Transfers      General transfer comment: pt too weak to safely attempt standing.        Balance Overall balance assessment: Needs assistance Sitting-balance support: Feet supported;Bilateral upper extremity supported Sitting balance-Leahy Scale: Zero Sitting balance - Comments: requires constant assistance to maintain sitting balance EOB. mod assist of one to maintain. Postural control: Posterior lean           Cognition Arousal/Alertness: Awake/alert Behavior During Therapy: Flat affect Overall Cognitive Status: Within Functional Limits for tasks assessed         General Comments: Pt has flat affect but is A and oriented x 3. agreeable and cooperative. very deconditioned.  Cardiologist in room cleared pt to perform sitting up EOB even with elevated RR/HR at rest.      Exercises Other Exercises Other Exercises: Session focused on sitting balance and strengthening while seated EOB. performed AAROM exercises while pt was EOB. in supine perform AAROM/PROM. discussed with MD order for  [facility] boots and MD in agreement to prevent ankle contractures.        Pertinent Vitals/Pain Pain Assessment: No/denies pain Faces Pain Scale: No hurt Pain Intervention(s): Limited activity within patient's tolerance;Monitored during session;Premedicated before session;Repositioned           PT Goals (current goals can now be found in the care plan section) Acute Rehab PT Goals Patient Stated Goal: none stated Progress towards PT goals: Progressing toward goals    Frequency    Min 2X/week      PT Plan Current plan remains appropriate       AM-PAC PT "6 Clicks" Mobility   Outcome Measure  Help needed turning from your back to your side while in a flat bed without using bedrails?: A Lot Help needed moving from lying on your back to sitting on the side of a flat bed without using bedrails?: Total Help needed moving to and from a bed to a chair (including a wheelchair)?: Total Help needed standing up from a chair using your arms (e.g., wheelchair or bedside chair)?: Total Help needed to walk in hospital room?: Total Help needed climbing 3-5 steps with a railing? : Total 6 Click Score: 7    End of Session Equipment Utilized During Treatment: Oxygen Activity Tolerance: Patient tolerated treatment well Patient left: in bed;with call bell/phone within reach;with SCD's reapplied;with bed alarm set Nurse Communication: Mobility status PT Visit Diagnosis: Muscle weakness (generalized) (M62.81);Difficulty in walking, not elsewhere classified (R26.2)     Time: 3704-8889 PT Time Calculation (min) (ACUTE ONLY): 24 min  Charges:  $Therapeutic Activity: 8-22 mins $Neuromuscular Re-education: 8-22 mins                    Julaine Fusi PTA 03/23/20, 11:22 AM

## 2020-03-23 NOTE — Consult Note (Addendum)
PHARMACY - TOTAL PARENTERAL NUTRITION CONSULT NOTE   Indication: Prolonged ileus  Patient Measurements: Height: 5' 7.99" (172.7 cm) Weight: 81.5 kg (179 lb 10.8 oz) IBW/kg (Calculated) : 68.38 TPN AdjBW (KG): 73 Body mass index is 27.33 kg/m.  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 Patient was intubated 9/1 - 9/5. Patient has been weaned off pressors. Pharmacy has been consulted to initiate TPN for prolonged ileus. Per dietician, free water increased to 193mL q4hrs d/t patient's goal TF regimen.  Glucose / Insulin: 276 - 365 (70u Novolog last 24h); patient was on IV steroids - d/c'd 9/8; Lantus 20u daily>16u BID>18uBID Electrolytes: Hypernatremia (454>098>119) Renal: AKI resolved. Good UOP LFTs / TGs: Mild transaminitis 9/3. TG 197 Prealbumin / albumin: Albumin of 2>2.2 Intake / Output; MIVF: Good UOP - net ~1.7L 9/7; D5W @50mL /hr>no longer on MIVF GI Imaging:  9/5 abdominal X-ray: Overall nonobstructive bowel gas pattern 9/1 CT abdomen pelvis: Large amount of pneumoperitoneum indicative of bowel perforation  Surgeries / Procedures:  9/1 Subtotal colectomy 9/3 I&D of intra-abdominal abscess, ileostomy creation  Central access: Central line placed 9/1 TPN start date: 9/7  Nutritional Goals (per RD recommendation on 9/7): kCal: 2200-2400 / day, Protein: 115-125 g/day, Fluid: 2.2 - 2.4 L / day Goal TPN rate is 100 mL/hr (provides 120 g of protein and 2318 kcals per day)   Current Nutrition:  NPO  Plan:  --Pt tube feeds were advanced, will decrease TPN back to 1/2 goal rate of 50 mL/hr at 1800. Might d/c TPN tomorrow if pt passes swallow eval --Electrolytes in TPN: 66mEq/L of K, 28mEq/L of Ca, 21mEq/L of Mg, and 84mmol/L of Phos. Cl:Ac ratio 1:1; Na removed --Add standard MVI and trace elements to TPN  --Continue Moderate q4h  SSI + Lantus 18u BID and adjust as needed - BG 276-365 Will add 27 units of insulin to TPN. Pt received a total of 70 units of insulin aspart 9/9. Rate is being decreased so patient will be receiving less dextrose to avoid overcorrection --Monitor TPN labs on Mon/Thurs; will check K, Mg, Phos daily x 3 days to monitor for re-feeding  Sherilyn Banker, PharmD Pharmacy Resident  03/23/2020 12:20 PM

## 2020-03-23 NOTE — Progress Notes (Signed)
Progress Note  Patient Name: Donald Stephenson. Date of Encounter: 03/23/2020  [facility] Cardiologist: [facility]-  Dr. Garen Lah consulted  Subjective   Reports that he feels well this morning Review of vitals, increased respiratory rate, tachycardic but denies any symptoms Getting ready to work with PT NG tube, and TPN  Inpatient Medications    Scheduled Meds: . carvedilol  12.5 mg Oral BID WC  . chlorhexidine  15 mL Mouth Rinse BID  . Chlorhexidine Gluconate Cloth  6 each Topical Daily  . enoxaparin (LOVENOX) injection  40 mg Subcutaneous Q24H  . feeding supplement (PIVOT 1.5 CAL)  1,000 mL Per Tube Q24H  . free water  30 mL Per Tube Q4H  . insulin aspart  0-20 Units Subcutaneous Q4H  . insulin glargine  16 Units Subcutaneous BID  . losartan  50 mg Oral Daily  . mouth rinse  15 mL Mouth Rinse q12n4p  .  morphine injection  0.5 mg Intravenous Once  . pantoprazole (PROTONIX) IV  40 mg Intravenous QHS   Continuous Infusions: . sodium chloride Stopped (03/22/20 0418)  . cefTRIAXone (ROCEPHIN)  IV 2 g (03/23/20 0919)  . metronidazole Stopped (03/23/20 0348)  . TPN ADULT (ION) 100 mL/hr at 03/23/20 0000   PRN Meds: sodium chloride, acetaminophen **OR** acetaminophen, hydrALAZINE, HYDROcodone-acetaminophen, ondansetron **OR** ondansetron (ZOFRAN) IV   Vital Signs    Vitals:   03/23/20 0358 03/23/20 0400 03/23/20 0434 03/23/20 0746  BP:    132/75  Pulse: (!) 102     Resp:   (!) 29 (!) 40  Temp: 98.4 F (36.9 C)   98.8 F (37.1 C)  TempSrc: Oral Oral  Axillary  SpO2: 90%   99%  Weight:   81.5 kg   Height:        Intake/Output Summary (Last 24 hours) at 03/23/2020 0941 Last data filed at 03/23/2020 7989 Gross per 24 hour  Intake 3634.16 ml  Output 2635 ml  Net 999.16 ml   Last 3 Weights 03/23/2020 03/22/2020 03/20/2020  Weight (lbs) 179 lb 10.8 oz 191 lb 9.3 oz 198 lb 6.6 oz  Weight (kg) 81.5 kg 86.9 kg 90 kg      Telemetry    Sinus tachycardia- Personally  Reviewed  ECG     - Personally Reviewed  Physical Exam   Constitutional:  oriented to person, place, and time. No distress.  NG tube, TPN lines in place HENT:  Head: Normocephalic and atraumatic.  Eyes:  no discharge. No scleral icterus.  Neck: Normal range of motion. Neck supple. No JVD present.  Cardiovascular: Regular, tachycardic normal heart sounds and intact distal pulses. Exam reveals no gallop and no friction rub. No edema No murmur heard. Pulmonary/Chest: Poor inspiratory effort, scattered Rales Abdominal: Soft.  Mildly distended .  no tenderness. , Decreased bowel sounds Musculoskeletal: Normal range of motion.  no  tenderness or deformity.  Neurological: Grossly normal, full exam not performed Skin: Skin is warm and dry. No rash noted. not diaphoretic.  Psychiatric:  normal mood and affect. behavior is normal. Thought content normal.      Labs    High Sensitivity Troponin:   Recent Labs  Lab 03/15/2020 1226 03/21/20 1642 03/21/20 1843  TROPONINIHS 33* 146* 142*      Chemistry Recent Labs  Lab 03/21/20 0552 03/21/20 0552 03/21/20 1647 03/22/20 0704 03/23/20 0548  NA 147*  147*  --   --  148* 150*  K 3.3*  3.2*   < > 3.5 3.3*  4.3  CL 107  107  --   --  108 112*  CO2 31  30  --   --  27 29  GLUCOSE 392*  387*  --   --  338* 365*  BUN 29*  28*  --   --  28* 34*  CREATININE 0.86  0.80  --   --  0.78 0.91  CALCIUM 7.5*  7.5*  --   --  7.9* 8.0*  PROT 5.7*  --   --  5.8*  --   ALBUMIN 2.3*  2.3*  --   --  2.4* 2.2*  AST 79*  --   --  43*  --   ALT 177*  --   --  117*  --   ALKPHOS 101  --   --  92  --   BILITOT 1.2  --   --  0.8  --   GFRNONAA >60  >60  --   --  >60 >60  GFRAA >60  >60  --   --  >60 >60  ANIONGAP 9  10  --   --  13 9   < > = values in this interval not displayed.     Hematology Recent Labs  Lab 03/19/20 0445 03/20/20 0606 03/21/20 0552  WBC 12.0* 14.4* 14.3*  RBC 3.85* 4.20* 4.61  HGB 11.2* 12.4* 13.3  HCT 36.1*  38.9* 41.9  MCV 93.8 92.6 90.9  MCH 29.1 29.5 28.9  MCHC 31.0 31.9 31.7  RDW 14.2 14.1 14.1  PLT 94* 125* 161    BNP Recent Labs  Lab 03/21/20 2129  BNP 611.8*     DDimer No results for input(s): DDIMER in the last 168 hours.   Radiology    DG Chest Port 1 View  Result Date: 03/21/2020 CLINICAL DATA:  Shortness of breath. EXAM: PORTABLE CHEST 1 VIEW COMPARISON:  March 14, 2020. FINDINGS: Stable cardiomediastinal silhouette. Nasogastric tube tip is seen in proximal stomach. Endotracheal tube has been removed. Right internal jugular catheter is unchanged in position. No pneumothorax is noted. Mild bibasilar subsegmental atelectasis is noted. Small left pleural effusion may be present. Bony thorax is unremarkable. IMPRESSION: Mild bibasilar subsegmental atelectasis. Small left pleural effusion may be present. Endotracheal tube has been removed. Electronically Signed   By: Marijo Conception M.D.   On: 03/21/2020 16:29    Cardiac Studies     Patient Profile     64 y.o. 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, postop day 8 status post subtotal colectomy and interval reopening of recent laparotomy with drainage of intra-abdominal abscess and ileostomy creation postop day 6 Respiratory failure mechanical ventilation due to hypoxia, septic shock, acute renal failure, diabetes, hypertension, malignant neoplasm splenic flexure Cardiology consult your arrhythmia, prolonged QT  Assessment & Plan    Abnormal EKG Normal sinus rhythm, right bundle branch block, QTc within normal limits No further work-up at this time  Cardiomyopathy Ejection fraction 35 to 40%, Carvedilol and losartan doses increased yesterday for hypertension Will monitor closely, blood pressure better today, more tachycardic  Essential hypertension Blood pressure improved on high-dose losartan carvedilol  Cecum perforation,: Obstructive mass, malignant neoplasm splenic flexure Recent  surgery, followed by surgical team On nasal gastric feeds --Watching increased respiratory rate and increased tachycardia this morning  Diabetes type 2 Sugars consistently running more than 300 Need regular insulin  Protein calorie malnutrition Albumin 2.2 On TPN, NG tube feeds   Total encounter time more than 25  minutes  Greater than 50% was spent in counseling and coordination of care with the patient   For questions or updates, please contact [place] Please consult [email or web address] for contact info under        Signed, Ida Rogue, MD  03/23/2020, 9:41 AM

## 2020-03-23 NOTE — Progress Notes (Signed)
[place] Hospital Day(s): 9.   Post op day(s): 7 Days Post-Op.   Interval History: Patient seen and examined, no acute events or new complaints overnight. Patient reports feeling ok. Denies any complain. More alert and responding more consciously.  Vital signs in last 24 hours: [min-max] current  Temp:  [97.8 F (36.6 C)-98.9 F (37.2 C)] 98.7 F (37.1 C) (09/10 1610) Pulse Rate:  [98-107] 101 (09/10 1610) Resp:  [23-40] 23 (09/10 1610) BP: (131-147)/(73-92) 131/73 (09/10 1610) SpO2:  [90 %-100 %] 100 % (09/10 1610) Weight:  [81.5 kg] 81.5 kg (09/10 0434)     Height: 5' 7.99" (172.7 cm) Weight: 81.5 kg BMI (Calculated): 27.33   Physical Exam:  Constitutional: alert, cooperative and no distress  Respiratory: breathing non-labored at rest  Cardiovascular: regular rate and sinus rhythm  Gastrointestinal: soft, non-tender, and non-distended. Ostomy pink and patent. Wound measures 25 cm x 2 cm.       Labs:  CBC Latest Ref Rng & Units 03/21/2020 03/20/2020 03/19/2020  WBC 4.0 - 10.5 K/uL 14.3(H) 14.4(H) 12.0(H)  Hemoglobin 13.0 - 17.0 g/dL 13.3 12.4(L) 11.2(L)  Hematocrit 39 - 52 % 41.9 38.9(L) 36.1(L)  Platelets 150 - 400 K/uL 161 125(L) 94(L)   CMP Latest Ref Rng & Units 03/23/2020 03/22/2020 03/21/2020  Glucose 70 - 99 mg/dL 365(H) 338(H) -  BUN 8 - 23 mg/dL 34(H) 28(H) -  Creatinine 0.61 - 1.24 mg/dL 0.91 0.78 -  Sodium 135 - 145 mmol/L 150(H) 148(H) -  Potassium 3.5 - 5.1 mmol/L 4.3 3.3(L) 3.5  Chloride 98 - 111 mmol/L 112(H) 108 -  CO2 22 - 32 mmol/L 29 27 -  Calcium 8.9 - 10.3 mg/dL 8.0(L) 7.9(L) -  Total Protein 6.5 - 8.1 g/dL - 5.8(L) -  Total Bilirubin 0.3 - 1.2 mg/dL - 0.8 -  Alkaline Phos 38 - 126 U/L - 92 -  AST 15 - 41 U/L - 43(H) -  ALT 0 - 44 U/L - 117(H) -    Imaging studies: No new pertinent imaging studies   Assessment/Plan:  64 y.o.malewith descending colon obstructive mass with cecum perforation9Day Post-Ops/p subtotal colectomyand  interval reopening of recent laparotomy with drainage of intra-abdominal abscess and ileostomy creation postoperative day #7, complicated by pertinent comorbidities including, respiratory failure mechanical ventilation due to hypoxia(resolved),septic shock(resolved), acute kidney injury(resolved), diabetes, hypertension, and newly diagnosed malignant neoplasm of the splenic flexure.  Patient recovering slowly. Tolerating tube feedings. Will continue to advance. Still very debilitated. Appreciate Physical therapy management. Will start weaning TPN. I personally changed the wound VAC system and the ileostomy and they are healing adequately.   Appreciate Hospitalist and Cardiology management of medical conditions.   Arnold Long, MD

## 2020-03-23 NOTE — Progress Notes (Signed)
[facility] [place] NAME: Donald Stephenson    MR#:  [id number]  DATE OF BIRTH:  Feb 27, 1956  SUBJECTIVE:  awake alert. Answers questions appropriately. Slow to respond. Denies any abdominal pain. Currently has NG tube with tube feeding started. Has TPN going well.  REVIEW OF SYSTEMS:   Review of Systems  Constitutional: Negative for chills, fever and weight loss.  HENT: Negative for ear discharge, ear pain and nosebleeds.   Eyes: Negative for blurred vision, pain and discharge.  Respiratory: Negative for sputum production, shortness of breath and stridor.   Cardiovascular: Negative for chest pain, palpitations, orthopnea and PND.  Gastrointestinal: Negative for abdominal pain, diarrhea, nausea and vomiting.  Genitourinary: Negative for frequency and urgency.  Musculoskeletal: Positive for back pain. Negative for joint pain.  Neurological: Positive for weakness. Negative for sensory change, speech change and focal weakness.  Psychiatric/Behavioral: Negative for depression and hallucinations. The patient is not nervous/anxious.    Tolerating Diet:tube feeding Tolerating PT: pending  DRUG ALLERGIES:   Allergies  Allergen Reactions  . Bee Venom Swelling  . Other Hives    ants    VITALS:  Blood pressure (!) 147/85, pulse 98, temperature 97.8 F (36.6 C), temperature source Oral, resp. rate (!) 36, height 5' 7.99" (1.727 m), weight 81.5 kg, SpO2 97 %.  PHYSICAL EXAMINATION:   Physical Exam  GENERAL:  64 y.o.-year-old patient lying in the bed with no acute distress. Chronically critically ill  EYES: Pupils equal, round, reactive to light and accommodation. No scleral icterus. NG tube+ HEENT: Head atraumatic, normocephalic. Oropharynx and nasopharynx clear.  NECK:  Supple, no jugular venous distention. No thyroid enlargement, no tenderness. Right IJ + LUNGS: Normal breath sounds bilaterally, no wheezing, rales, rhonchi. No use of accessory  muscles of respiration.  CARDIOVASCULAR: S1, S2 normal. No murmurs, rubs, or gallops.  ABDOMEN: colostomy plus. Midline surgical scar present with drains+ EXTREMITIES: No cyanosis, clubbing + edema b/l.    NEUROLOGIC: moves all extremities well.    PSYCHIATRIC:  patient is alert and awake SKIN Pressure Injury 03/19/20 Coccyx Right;Lateral;Lower Stage 2 -  Partial thickness loss of dermis presenting as a shallow open injury with a red, pink wound bed without slough. 5cm by 5cm peeled dermis area shallow open ulcer with red pink wound bed (Active)  03/19/20 1800  Location: Coccyx  Location Orientation: Right;Lateral;Lower  Staging: Stage 2 -  Partial thickness loss of dermis presenting as a shallow open injury with a red, pink wound bed without slough.  Wound Description (Comments): 5cm by 5cm peeled dermis area shallow open ulcer with red pink wound bed  Present on Admission: No     Pressure Injury 03/19/20 Coccyx Left;Medial;Mid Stage 2 -  Partial thickness loss of dermis presenting as a shallow open injury with a red, pink wound bed without slough. Intact dark serum filled blister area. (Active)  03/19/20 1800  Location: Coccyx  Location Orientation: Left;Medial;Mid  Staging: Stage 2 -  Partial thickness loss of dermis presenting as a shallow open injury with a red, pink wound bed without slough.  Wound Description (Comments): Intact dark serum filled blister area.  Present on Admission: No       LABORATORY PANEL:  CBC Recent Labs  Lab 03/21/20 0552  WBC 14.3*  HGB 13.3  HCT 41.9  PLT 161    Chemistries  Recent Labs  Lab 03/22/20 0704 03/22/20 0704 03/23/20 0548  NA 148*   < > 150*  K 3.3*   < > 4.3  CL 108   < > 112*  CO2 27   < > 29  GLUCOSE 338*   < > 365*  BUN 28*   < > 34*  CREATININE 0.78   < > 0.91  CALCIUM 7.9*   < > 8.0*  MG 1.9   < > 2.2  AST 43*  --   --   ALT 117*  --   --   ALKPHOS 92  --   --   BILITOT 0.8  --   --    < > = values in this  interval not displayed.   Cardiac Enzymes No results for input(s): TROPONINI in the last 168 hours. RADIOLOGY:  DG Chest Port 1 View  Result Date: 03/21/2020 CLINICAL DATA:  Shortness of breath. EXAM: PORTABLE CHEST 1 VIEW COMPARISON:  March 14, 2020. FINDINGS: Stable cardiomediastinal silhouette. Nasogastric tube tip is seen in proximal stomach. Endotracheal tube has been removed. Right internal jugular catheter is unchanged in position. No pneumothorax is noted. Mild bibasilar subsegmental atelectasis is noted. Small left pleural effusion may be present. Bony thorax is unremarkable. IMPRESSION: Mild bibasilar subsegmental atelectasis. Small left pleural effusion may be present. Endotracheal tube has been removed. Electronically Signed   By: Marijo Conception M.D.   On: 03/21/2020 16:29   ASSESSMENT AND PLAN:  Mr. Klabunde is a 64 y.o. M with diabetes, hypertension who presented with abdominal pain.   In the ER, found to have air under diaphragm, taken emergently to OR for ex-lap, found to have perforated viscus, underwent colectomy, returned to ICU intubated and with open abdomen.  pt was transferred out of ICU 03/20/20  Septic shock due to perforated colon Acute respiratory failrue with hypoxia due to septic shock S/p ex-lap, subtotal colectomy and negative pressure dressing by Dr. Peyton Najjar 9/1 S/p small bowel resection, replaced negative pressure dressing, I&D, and ileostomy creation by Dr. Peyton Najjar 9/3 Patient presented with tachycardia, tachypnea, AMS, and lactate 7 mmol/L, found to have free air in abdomen, taken for emergent ex-lap. -Taken back to ICU intubated with negative pressure dressing.  Repeat ex-lap 9/3 with ileostomy creation. -Extubated 9/6 -transferred out of ICU 9/7 -abdominal wound  cultures growing only Bacteroides and sensitive GNRs. -Given severity of infection, pt completed IV anidulafungin 7 total days  -IV Flagyl, ceftriaxone day 9] -TPN per Surgery -9/9--ok to  start TUBE feeding at slow rate per surgery -9/10 tolerating TF well. Started PT sat at the edge of the bed  Newly diagnosed metastatic adenoCA of colon -Consult Oncology -- patient seen by Dr. Chauncey Cruel follow at later date  Prolonged QTC noted on EKG -potassium 3.2--3.7 -pharmacy consult placed for replacement  -[facility] cardiology input appreciated  Diabetes type 2 with hyperglycemia without long term insulin Glucoses elevated -increase Lantus to Bid for better sugar contrl -Hold Jardiance, metformin, glimepiride  Cardiomyopathy Ejection fraction 35 to 40%, On carvedilol, losartan Given high blood pressure will increase doses of losartan and carvedilol  Hypertension -continue Coreg losartan  Pressure injury, stage II coccyx, not POA continue wound management  Acute renal failure due to septic shock Resolved   GI protection -Continue PPI  Hypernatremia -Start hypotonic fluids-- D5 water  Thrombocytopenia Mild, resolving, no bleeding  Foley removed 9/8   Disposition: Status is: Inpatient  Remains inpatient appropriate because:Inpatient level of care appropriate due to severity of illness   Dispo: The patient is from: Home  Anticipated d/c is to: SNF or CIR  Anticipated d/c date is: > 3 days  Patient currently is not medically stable to d/c. Patient admitted with perforated colon.  Now s/p ex-lab and ostomy.  Started on TPN  patient is started on tube feeding. Need to wean off TPN physical therapy to see TOC for discharge planning-- very slow improvement.  Spoke with wife Donald Stephenson on 9/9/21and updated her    TOTAL TIME TAKING CARE OF THIS PATIENT: *25* minutes.  >50% time spent on counselling and coordination of care  Note: This dictation was prepared with Dragon dictation along with smaller phrase technology. Any transcriptional errors that result from this process are unintentional.  Fritzi Mandes  M.D    [facility]   CC: Primary care physician; Donnamarie Rossetti, PA-CPatient ID: Donald Pippins., male   DOB: 1955/11/23, 64 y.o.   MRN: [id number]

## 2020-03-23 NOTE — Progress Notes (Signed)
Pt seen on 9/10. Doing ok; discussed with Dr.Patel. LVM for daughter Donald Stephenson re: need for adjuvant chemo post discharge.  GB

## 2020-03-24 DIAGNOSIS — R0602 Shortness of breath: Secondary | ICD-10-CM

## 2020-03-24 LAB — RENAL FUNCTION PANEL
Albumin: 2.1 g/dL — ABNORMAL LOW (ref 3.5–5.0)
Anion gap: 8 (ref 5–15)
BUN: 42 mg/dL — ABNORMAL HIGH (ref 8–23)
CO2: 26 mmol/L (ref 22–32)
Calcium: 8.1 mg/dL — ABNORMAL LOW (ref 8.9–10.3)
Chloride: 116 mmol/L — ABNORMAL HIGH (ref 98–111)
Creatinine, Ser: 0.93 mg/dL (ref 0.61–1.24)
GFR calc Af Amer: >60 mL/min (ref >60)
GFR calc non Af Amer: >60 mL/min (ref >60)
Glucose, Bld: 328 mg/dL — ABNORMAL HIGH (ref 70–99)
Phosphorus: 2.4 mg/dL — ABNORMAL LOW (ref 2.5–4.6)
Potassium: 4.3 mmol/L (ref 3.5–5.1)
Sodium: 150 mmol/L — ABNORMAL HIGH (ref 135–145)

## 2020-03-24 LAB — GLUCOSE, CAPILLARY
Glucose-Capillary: 274 mg/dL — ABNORMAL HIGH (ref 70–99)
Glucose-Capillary: 277 mg/dL — ABNORMAL HIGH (ref 70–99)
Glucose-Capillary: 285 mg/dL — ABNORMAL HIGH (ref 70–99)
Glucose-Capillary: 297 mg/dL — ABNORMAL HIGH (ref 70–99)
Glucose-Capillary: 312 mg/dL — ABNORMAL HIGH (ref 70–99)
Glucose-Capillary: 340 mg/dL — ABNORMAL HIGH (ref 70–99)

## 2020-03-24 LAB — MAGNESIUM: Magnesium: 2.1 mg/dL (ref 1.7–2.4)

## 2020-03-24 MED ORDER — LOPERAMIDE HCL 1 MG/7.5ML PO SUSP
2.0000 mg | Freq: Two times a day (BID) | ORAL | Status: DC
  Administered 2020-03-24: 2 mg via ORAL
  Filled 2020-03-24 (×8): qty 15

## 2020-03-24 MED ORDER — INSULIN GLARGINE 100 UNIT/ML ~~LOC~~ SOLN
20.0000 [IU] | Freq: Two times a day (BID) | SUBCUTANEOUS | Status: DC
  Administered 2020-03-24 – 2020-03-25 (×2): 20 [IU] via SUBCUTANEOUS
  Filled 2020-03-24 (×4): qty 0.2

## 2020-03-24 NOTE — Progress Notes (Signed)
[place] Hospital Day(s): 10.   Post op day(s): 8 Days Post-Op.   Interval History: Patient seen and examined, no acute events or new complaints overnight. Patient reports feeling OK.  Pressure more talkative and able to say his name and his wife's name.  Patient is very grateful for his care.  Vital signs in last 24 hours: [min-max] current  Temp:  [97.8 F (36.6 C)-100 F (37.8 C)] 97.8 F (36.6 C) (09/11 0820) Pulse Rate:  [84-181] 93 (09/11 0820) Resp:  [23-44] 23 (09/11 0820) BP: (123-147)/(72-85) 132/78 (09/11 0820) SpO2:  [93 %-100 %] 100 % (09/11 0820) Weight:  [85.7 kg] 85.7 kg (09/11 0641)     Height: 5' 7.99" (172.7 cm) Weight: 85.7 kg BMI (Calculated): 28.73   Physical Exam:  Constitutional: alert, cooperative and no distress  Respiratory: breathing non-labored at rest  Cardiovascular: regular rate and sinus rhythm  Gastrointestinal: soft, non-tender, and non-distended.  Ostomy pink and patent.  Labs:  CBC Latest Ref Rng & Units 03/21/2020 03/20/2020 03/19/2020  WBC 4.0 - 10.5 K/uL 14.3(H) 14.4(H) 12.0(H)  Hemoglobin 13.0 - 17.0 g/dL 13.3 12.4(L) 11.2(L)  Hematocrit 39 - 52 % 41.9 38.9(L) 36.1(L)  Platelets 150 - 400 K/uL 161 125(L) 94(L)   CMP Latest Ref Rng & Units 03/23/2020 03/22/2020 03/21/2020  Glucose 70 - 99 mg/dL 365(H) 338(H) -  BUN 8 - 23 mg/dL 34(H) 28(H) -  Creatinine 0.61 - 1.24 mg/dL 0.91 0.78 -  Sodium 135 - 145 mmol/L 150(H) 148(H) -  Potassium 3.5 - 5.1 mmol/L 4.3 3.3(L) 3.5  Chloride 98 - 111 mmol/L 112(H) 108 -  CO2 22 - 32 mmol/L 29 27 -  Calcium 8.9 - 10.3 mg/dL 8.0(L) 7.9(L) -  Total Protein 6.5 - 8.1 g/dL - 5.8(L) -  Total Bilirubin 0.3 - 1.2 mg/dL - 0.8 -  Alkaline Phos 38 - 126 U/L - 92 -  AST 15 - 41 U/L - 43(H) -  ALT 0 - 44 U/L - 117(H) -    Imaging studies: No new pertinent imaging studies   Assessment/Plan:  64 y.o.malewith descending colon obstructive mass with cecum perforation10Day Post-Ops/p subtotal  colectomyand interval reopening of recent laparotomy with drainage of intra-abdominal abscess and ileostomy creation postoperative day #8, complicated by pertinent comorbidities including, respiratory failure mechanical ventilation due to hypoxia(resolved),septic shock(resolved), acute kidney injury(resolved), diabetes, hypertension, and newly diagnosed malignant neoplasm of the splenic flexure.  Patient recovering slowly.  Patient continues to get more talkative and active every day.  Still significantly debilitated unable to even put his head straight forward.  I recommended a pillow so his neck is not turned to one side.  Due to the severe deconditioning patient still on oral tube feedings.  Hopefully with physical therapy and swallow evaluation patient will be able to be transition to oral feedings.  Patient will need full assistance for feedings.  There was over a liter of stool in the ileostomy bag.  I will add a very low-dose Imodium to make the stool elevated be more pasty.  No contraindication to advance patient to go on to feedings.  Agree with discontinue TPN today.  Wound covered with negative pressure dressing.  Ileostomy bag working adequately.  Appreciate hospitalist and cardiologist management of CHF, diabetes, hypertension and other medical comorbidities.  Patient currently in high need of physical therapy.  No contraindication for physical therapy from my standpoint.  Arnold Long, MD

## 2020-03-24 NOTE — Consult Note (Signed)
PHARMACY - TOTAL PARENTERAL NUTRITION CONSULT NOTE   Indication: Prolonged ileus  Patient Measurements: Height: 5' 7.99" (172.7 cm) Weight: 85.7 kg (188 lb 15 oz) IBW/kg (Calculated) : 68.38 TPN AdjBW (KG): 73 Body mass index is 28.73 kg/m.  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 Patient was intubated 9/1 - 9/5. Patient has been weaned off pressors. Pharmacy has been consulted to initiate TPN for prolonged ileus. Per dietician, free water increased to 185mL q4hrs d/t patient's goal TF regimen.  Glucose / Insulin: 276 - 365 (70u Novolog last 24h); patient was on IV steroids - d/c'd 9/8; Lantus 20u daily>16u BID>18uBID Electrolytes: Hypernatremia ([id number]) Renal: AKI resolved. Good UOP LFTs / TGs: Mild transaminitis 9/3. TG 197 Prealbumin / albumin: Albumin of 2>2.2 Intake / Output; MIVF: Good UOP - net ~1.7L 9/7; D5W @50mL /hr>no longer on MIVF GI Imaging:  9/5 abdominal X-ray: Overall nonobstructive bowel gas pattern 9/1 CT abdomen pelvis: Large amount of pneumoperitoneum indicative of bowel perforation  Surgeries / Procedures:  9/1 Subtotal colectomy 9/3 I&D of intra-abdominal abscess, ileostomy creation  Central access: Central line placed 9/1 TPN start date: 9/7  Nutritional Goals (per RD recommendation on 9/7): kCal: 2200-2400 / day, Protein: 115-125 g/day, Fluid: 2.2 - 2.4 L / day Goal TPN rate is 100 mL/hr (provides 120 g of protein and 2318 kcals per day)   Current Nutrition:  NPO  Plan:  --Pt tube feeds were advanced close to goal, will d/c TPN per discussion with dietician - Will monitor BG's carefully following discontinuation of TPN - Will continue to monitor electrolytes and replenish as needed  Lu Duffel, PharmD, BCPS Clinical Pharmacist 03/24/2020 9:33 AM

## 2020-03-24 NOTE — Progress Notes (Signed)
Physical Therapy Treatment Patient Details Name: Donald Stephenson. MRN: [id number] DOB: September 03, 1955 Today's Date: 03/24/2020    History of Present Illness Per MD notes: Pt is a 64 yo male who came in for abdominal pain and lethargy, found to be in shock and had CXR with air under the diaphragm. CT abd with bowel perforation, patient taken to OR emergently found to have 700cc mucopurulent material now s/p surgery with resection and post operative septic shock.  MD assessement includes: Severe ACUTE Hypoxic and Hypercapnic Respiratory Failure due to severe septic shock from perforated viscus, AKI/renal failure, intubated initially now extubated, and acute toxic metabolic encephalopathy.    PT Comments    Pt was long sitting in bed upon arriving. His supportive spouse is at bedside. Pts has flat affect but is cooperative and pleasant throughout. Several minutes spent educating pt and spouse on what to expect from a PT standpoint going forward. Pt is extremely deconditioned and will require extensive PT going forward. Currently recommending SNF at DC 2/2 to limited activity tolerance. Hopefull that with improvements may be able to progress to CIR level of rehab. Will need extensive rehab. He does have improved active strength in BLEs today but still only able to perform  through minimal range against gravity. Max assist to roll R with a lot of time and vcs. From side lying, requires total assist to safely achieve EOB short sit. Spouse was +2 for safety. Sat EOB 5-7 minutes with constant mod assist to prevent LOB posteriorly. He reports fatigue and request to return to supine. RN entered room at conclusion of session. Pt was in bed with SCDs & PRAFO boots reapplied to prevent contractures. He tolerated session well but quickly falls asleep after returning to bed.      Follow Up Recommendations  SNF     Equipment Recommendations  Other (comment) (defer to next level of care)    Recommendations for Other  Services       Precautions / Restrictions Precautions Precautions: Fall Restrictions Other Position/Activity Restrictions: Colostomy and JP drain RLQ, JP drain LLQ, abdominal wound vac    Mobility  Bed Mobility Overal bed mobility: Needs Assistance Bed Mobility: Rolling Rolling: Max assist;+2 for safety/equipment Sidelying to sit: Max assist;Total assist     Sit to sidelying: Total assist General bed mobility comments: Pt does have slightly imporve active strength this date versus previous day observed. He still is extremely weak and deconditioned but motivated and cooperative. was able to tolerate sitting on EOB x 5-7 minutes.  Transfers                 General transfer comment: unsafe to trial       Balance Overall balance assessment: Needs assistance Sitting-balance support: Feet supported;Bilateral upper extremity supported Sitting balance-Leahy Scale: Zero Sitting balance - Comments: requires constant assist to maintain balance in sitting. constant vcs for fwd wt shift.  Postural control: Posterior lean (severe)           Cognition Arousal/Alertness: Awake/alert Behavior During Therapy: Flat affect Overall Cognitive Status: Within Functional Limits for tasks assessed        General Comments: Pt has flat affect but is A and oriented x 3. agreeable and cooperative. very deconditioned. Spouse present and supportive throughout             Pertinent Vitals/Pain Pain Assessment: No/denies pain Faces Pain Scale: No hurt Pain Location: incisional pain Pain Descriptors / Indicators: Grimacing Pain Intervention(s): Limited activity within patient's tolerance;Monitored  during session;Repositioned           PT Goals (current goals can now be found in the care plan section) Acute Rehab PT Goals Patient Stated Goal: get better so I can return home Progress towards PT goals: Progressing toward goals    Frequency    Min 2X/week      PT Plan Current  plan remains appropriate       AM-PAC PT "6 Clicks" Mobility   Outcome Measure  Help needed turning from your back to your side while in a flat bed without using bedrails?: A Lot Help needed moving from lying on your back to sitting on the side of a flat bed without using bedrails?: Total Help needed moving to and from a bed to a chair (including a wheelchair)?: Total Help needed standing up from a chair using your arms (e.g., wheelchair or bedside chair)?: Total Help needed to walk in hospital room?: Total Help needed climbing 3-5 steps with a railing? : Total 6 Click Score: 7    End of Session Equipment Utilized During Treatment: Oxygen Activity Tolerance: Patient tolerated treatment well;Patient limited by fatigue Patient left: in bed;with call bell/phone within reach;with SCD's reapplied;with bed alarm set;with family/visitor present Nurse Communication: Mobility status PT Visit Diagnosis: Muscle weakness (generalized) (M62.81);Difficulty in walking, not elsewhere classified (R26.2)     Time: 1250-1315 PT Time Calculation (min) (ACUTE ONLY): 25 min  Charges:  $Therapeutic Activity: 23-37 mins                     Julaine Fusi PTA 03/24/20, 1:34 PM

## 2020-03-24 NOTE — Progress Notes (Signed)
[facility] [place] NAME: Donald Stephenson    MR#:  [id number]  DATE OF BIRTH:  15-Sep-1955  SUBJECTIVE:  awake alert. Answers questions appropriately. Slow to respond. Denies any abdominal pain. Currently has NG tube with tube feeding started. Has TPN going well.  REVIEW OF SYSTEMS:   Review of Systems  Constitutional: Negative for chills, fever and weight loss.  HENT: Negative for ear discharge, ear pain and nosebleeds.   Eyes: Negative for blurred vision, pain and discharge.  Respiratory: Negative for sputum production, shortness of breath and stridor.   Cardiovascular: Negative for chest pain, palpitations, orthopnea and PND.  Gastrointestinal: Negative for abdominal pain, diarrhea, nausea and vomiting.  Genitourinary: Negative for frequency and urgency.  Musculoskeletal: Positive for back pain. Negative for joint pain.  Neurological: Positive for weakness. Negative for sensory change, speech change and focal weakness.  Psychiatric/Behavioral: Negative for depression and hallucinations. The patient is not nervous/anxious.    Tolerating Diet:tube feeding Tolerating PT:STR/CIR  DRUG ALLERGIES:   Allergies  Allergen Reactions  . Bee Venom Swelling  . Other Hives    ants    VITALS:  Blood pressure 125/67, pulse 95, temperature 97.6 F (36.4 C), temperature source Axillary, resp. rate (!) 23, height 5' 7.99" (1.727 m), weight 85.7 kg, SpO2 98 %.  PHYSICAL EXAMINATION:   Physical Exam  GENERAL:  64 y.o.-year-old patient lying in the bed with no acute distress. Chronically critically ill  EYES: Pupils equal, round, reactive to light and accommodation. No scleral icterus. NG tube+ HEENT: Head atraumatic, normocephalic. Oropharynx and nasopharynx clear.  NECK:  Supple, no jugular venous distention. No thyroid enlargement, no tenderness. Right IJ + LUNGS: Normal breath sounds bilaterally, no wheezing, rales, rhonchi. No use of accessory  muscles of respiration.  CARDIOVASCULAR: S1, S2 normal. No murmurs, rubs, or gallops.  ABDOMEN: colostomy plus. Midline surgical scar present with drains+ EXTREMITIES: No cyanosis, clubbing + edema b/l.    NEUROLOGIC: moves all extremities well.    PSYCHIATRIC:  patient is alert and awake SKIN Pressure Injury 03/19/20 Coccyx Right;Lateral;Lower Stage 2 -  Partial thickness loss of dermis presenting as a shallow open injury with a red, pink wound bed without slough. 5cm by 5cm peeled dermis area shallow open ulcer with red pink wound bed (Active)  03/19/20 1800  Location: Coccyx  Location Orientation: Right;Lateral;Lower  Staging: Stage 2 -  Partial thickness loss of dermis presenting as a shallow open injury with a red, pink wound bed without slough.  Wound Description (Comments): 5cm by 5cm peeled dermis area shallow open ulcer with red pink wound bed  Present on Admission: No     Pressure Injury 03/19/20 Coccyx Left;Medial;Mid Stage 2 -  Partial thickness loss of dermis presenting as a shallow open injury with a red, pink wound bed without slough. Intact dark serum filled blister area. (Active)  03/19/20 1800  Location: Coccyx  Location Orientation: Left;Medial;Mid  Staging: Stage 2 -  Partial thickness loss of dermis presenting as a shallow open injury with a red, pink wound bed without slough.  Wound Description (Comments): Intact dark serum filled blister area.  Present on Admission: No       LABORATORY PANEL:  CBC Recent Labs  Lab 03/21/20 0552  WBC 14.3*  HGB 13.3  HCT 41.9  PLT 161    Chemistries  Recent Labs  Lab 03/22/20 0704 03/23/20 0548 03/24/20 1136  NA 148*   < > 150*  K 3.3*   < >  4.3  CL 108   < > 116*  CO2 27   < > 26  GLUCOSE 338*   < > 328*  BUN 28*   < > 42*  CREATININE 0.78   < > 0.93  CALCIUM 7.9*   < > 8.1*  MG 1.9   < > 2.1  AST 43*  --   --   ALT 117*  --   --   ALKPHOS 92  --   --   BILITOT 0.8  --   --    < > = values in this  interval not displayed.   Cardiac Enzymes No results for input(s): TROPONINI in the last 168 hours. RADIOLOGY:  No results found. ASSESSMENT AND PLAN:  Mr. Donald Stephenson is a 64 y.o. M with diabetes, hypertension who presented with abdominal pain.   In the ER, found to have air under diaphragm, taken emergently to OR for ex-lap, found to have perforated viscus, underwent colectomy, returned to ICU intubated and with open abdomen.  pt was transferred out of ICU 03/20/20  Septic shock due to perforated colon Acute respiratory failrue with hypoxia due to septic shock S/p ex-lap, subtotal colectomy and negative pressure dressing by Dr. Windell Stephenson 9/1 S/p small bowel resection, replaced negative pressure dressing, I&D, and ileostomy creation by Dr. Peyton Stephenson 9/3 Patient presented with tachycardia, tachypnea, AMS, and lactate 7 mmol/L, found to have free air in abdomen, taken for emergent ex-lap. -Taken back to ICU intubated with negative pressure dressing.  Repeat ex-lap 9/3 with ileostomy creation. -9/6 Extubated  -9/7transferred out of ICU  -abdominal wound  cultures growing only Bacteroides, kleibsiella, e coli -Given severity of infection, pt completed IV anidulafungin 7 total days  -IV Flagyl, ceftriaxone (day 10/14) -TPN per Surgery--to be weaned off today -9/9--ok to start TUBE feeding at slow rate per surgery -9/10 tolerating TF well. Started PT sat at the edge of the bed -9/11 pt will be weaned off TPN at 1800 hers, TF at goal, Speech therapy started CLD  Newly diagnosed metastatic adenoCA of colon -Consult Oncology -- patient seen by Dr. Chauncey Stephenson follow at later date as out pt  Prolonged QTC noted on EKG -potassium 3.2--3.7 -pharmacy consult placed for replacement  -Donald Stephenson MG cardiology input appreciated  Diabetes type 2 with hyperglycemia without long term insulin Glucoses elevated -increase Lantus to Bid for better sugar contrl -Hold Jardiance, metformin,  glimepiride  Cardiomyopathy Ejection fraction 35 to 40%, On carvedilol, losartan Given high blood pressure will increase doses of losartan and carvedilol  Hypertension -continue Coreg losartan  Pressure injury, stage II coccyx, not POA continue wound management  Acute renal failure due to septic shock Resolved   GI protection -Continue PPI  Hypernatremia -Start hypotonic fluids-- D5 water  Thrombocytopenia Mild, resolving, no bleeding  Foley removed 9/8  Right IJ will need to be removed in 1-2 days.  Disposition: Status is: Inpatient  Remains inpatient appropriate because:Inpatient level of care appropriate due to severity of illness   Dispo: The patient is from: Home  Anticipated d/c is to: SNF or CIR  Anticipated d/c date is: > 3 days  Patient currently is not medically stable to d/c. Patient admitted with perforated colon.  Now s/p ex-lab and ostomy.   Started on TPN  patient is started on tube feeding. Need to wean off TPN physical therapy rec CIR/STR TOC for discharge planning-- very slow improvement.  Spoke with wife Ms Penley on 9/9/21and updated her    [place]  OF THIS PATIENT: *25* minutes.  >50% time spent on counselling and coordination of care  Note: This dictation was prepared with Dragon dictation along with smaller phrase technology. Any transcriptional errors that result from this process are unintentional.  Fritzi Mandes M.D    [facility]   CC: Primary care physician; Donnamarie Rossetti, PA-CPatient ID: Donald Stephenson., male   DOB: October 01, 1955, 64 y.o.   MRN: [id number]

## 2020-03-24 NOTE — Progress Notes (Signed)
Progress Note  Patient Name: Donald Stephenson. Date of Encounter: 03/24/2020  Primary Cardiologist: new to [facility] - consult by [place]  Subjective   No chest pain. Shallow breathing noted. Sat up on the bed with PT 9/10. Planning to work with them again. Labs pending this morning.   Inpatient Medications    Scheduled Meds: . carvedilol  12.5 mg Oral BID WC  . chlorhexidine  15 mL Mouth Rinse BID  . Chlorhexidine Gluconate Cloth  6 each Topical Daily  . enoxaparin (LOVENOX) injection  40 mg Subcutaneous Q24H  . feeding supplement (PIVOT 1.5 CAL)  1,000 mL Per Tube Q24H  . free water  150 mL Per Tube Q4H  . insulin aspart  0-20 Units Subcutaneous Q4H  . insulin glargine  18 Units Subcutaneous BID  . loperamide HCl  2 mg Oral BID  . losartan  50 mg Oral Daily  . mouth rinse  15 mL Mouth Rinse q12n4p  .  morphine injection  0.5 mg Intravenous Once  . nutrition supplement (JUVEN)  1 packet Per Tube BID BM  . pantoprazole (PROTONIX) IV  40 mg Intravenous QHS   Continuous Infusions: . sodium chloride 500 mL (03/24/20 0423)  . cefTRIAXone (ROCEPHIN)  IV 2 g (03/24/20 1191)  . metronidazole 500 mg (03/24/20 0933)  . TPN ADULT (ION) 50 mL/hr at 03/23/20 1851   PRN Meds: sodium chloride, acetaminophen **OR** acetaminophen, hydrALAZINE, HYDROcodone-acetaminophen, ondansetron **OR** ondansetron (ZOFRAN) IV   Vital Signs    Vitals:   03/24/20 1105 03/24/20 1110 03/24/20 1115 03/24/20 1120  BP:    125/67  Pulse: 98 94 94 95  Resp:      Temp:    97.6 F (36.4 C)  TempSrc:    Axillary  SpO2: 97% 98% 98% 98%  Weight:      Height:        Intake/Output Summary (Last 24 hours) at 03/24/2020 1215 Last data filed at 03/24/2020 0643 Gross per 24 hour  Intake --  Output 2405.8 ml  Net -2405.8 ml   Filed Weights   03/22/20 0651 03/23/20 0434 03/24/20 0641  Weight: 86.9 kg 81.5 kg 85.7 kg    Telemetry    SR with rates in the 90s to low 100s bpm- Personally  Reviewed  ECG    No new tracings - Personally Reviewed  Physical Exam   GEN: No acute distress. NG tube noted in place. Frail appearing.  Neck: No JVD. Cardiac: RRR, no murmurs, rubs, or gallops.  Respiratory: Clear to auscultation bilaterally with poor inspiratory effort.  GI: Soft, nontender, non-distended.   MS: No edema; No deformity. Neuro:  Alert and oriented x 3; Nonfocal.  Psych: Normal affect.  Labs    Chemistry Recent Labs  Lab 03/21/20 [phone] 03/21/20 0552 03/21/20 1647 03/22/20 0704 03/23/20 0548  NA 147*  147*  --   --  148* 150*  K 3.3*  3.2*   < > 3.5 3.3* 4.3  CL 107  107  --   --  108 112*  CO2 31  30  --   --  27 29  GLUCOSE 392*  387*  --   --  338* 365*  BUN 29*  28*  --   --  28* 34*  CREATININE 0.86  0.80  --   --  0.78 0.91  CALCIUM 7.5*  7.5*  --   --  7.9* 8.0*  PROT 5.7*  --   --  5.8*  --  ALBUMIN 2.3*  2.3*  --   --  2.4* 2.2*  AST 79*  --   --  43*  --   ALT 177*  --   --  117*  --   ALKPHOS 101  --   --  92  --   BILITOT 1.2  --   --  0.8  --   GFRNONAA >60  >60  --   --  >60 >60  GFRAA >60  >60  --   --  >60 >60  ANIONGAP 9  10  --   --  13 9   < > = values in this interval not displayed.     Hematology Recent Labs  Lab 03/19/20 0445 03/20/20 0606 03/21/20 0552  WBC 12.0* 14.4* 14.3*  RBC 3.85* 4.20* 4.61  HGB 11.2* 12.4* 13.3  HCT 36.1* 38.9* 41.9  MCV 93.8 92.6 90.9  MCH 29.1 29.5 28.9  MCHC 31.0 31.9 31.7  RDW 14.2 14.1 14.1  PLT 94* 125* 161    Cardiac EnzymesNo results for input(s): TROPONINI in the last 168 hours. No results for input(s): TROPIPOC in the last 168 hours.   BNP Recent Labs  Lab 03/21/20 2129  BNP 611.8*     DDimer No results for input(s): DDIMER in the last 168 hours.   Radiology    No results found.  Cardiac Studies   2D echo 04/10/2020: 1. Left ventricular ejection fraction, by estimation, is 35 to 40%. The  left ventricle has moderately decreased function. The left  ventricle  demonstrates global hypokinesis. Left ventricular diastolic parameters are  consistent with Grade II diastolic  dysfunction (pseudonormalization).  2. Right ventricular systolic function is mildly reduced. The right  ventricular size is normal. Tricuspid regurgitation signal is inadequate  for assessing PA pressure.  3. The mitral valve is normal in structure. Mild mitral valve  regurgitation.  Patient Profile     64 y.o. male with history of descending colon obstructive mass with cecum perforation status post subtotal colectomy and interval reopening of recent laparotomy with drainage of intra-abdominal abscess and ileostomy creation, acute hypoxic respiratory failure s/p mechanical ventilation now extubated, septic shock, acute renal failure, diabetes, hypertension, malignant neoplasm of the splenic flexure, who we are seeing for prolonged QT and new cardiomyopathy.   Assessment & Plan    1. Prolonged QT: -EKG with NSR with RBBB -QTc normal -Avoid QT prolonging medications -No further work up  2. Acute HFrEF: -Etiology uncertain at this time -Ischemic evaluation is deferred at this time in the setting of his acute illness -Continue GDMT with Coreg and losartan -Prior to discharge, consider addition of spironolactone  -In follow up, consider adding [place] for outpatient ischemic evaluation in follow up after he is improved from his acute illness and surgery   3. AKI: -Improved  4. HTN: -Well controlled -Continue current therapy as outlined above    For questions or updates, please contact [place] Please consult [email or web address] for contact info under Cardiology/STEMI.    Signed, Christell Faith, PA-C [place] Pager: [phone] 03/24/2020, 12:15 PM

## 2020-03-24 NOTE — Progress Notes (Signed)
Brief Nutrition follow-up:  Received page from [facility]) regarding plan for TPN.   RD spoke with Diane RN who indicates that pt is tolerating TF well at rate of 50 ml/hr, with plans to titrate to goal rate of 60 ml/hr later today. TPN is currently at half rate.   Noted CBGs on 200s-300s; not well controlled.  Sodium 150 yesterday; BMP pending for today. May need to increase free water depending on sodium trend.   Plan:   D/C TPN at 1800 today (let bag run out)  Continue Pivot 1.5 with goal of 60 ml/hr per NG tube  Blood sugar and electrolyte concerns discussed with Pharmacist  Kerman Passey MS, RDN, LDN, CNSC Registered Dietitian III Clinical Nutrition RD Pager and On-Call Pager Number Located in [place]

## 2020-03-24 NOTE — Evaluation (Signed)
Clinical/Bedside Swallow Evaluation Patient Details  Name: Donald Stephenson. MRN: [id number] Date of Birth: Apr 14, 1956  Today's Date: 03/24/2020 Time: SLP Start Time (ACUTE ONLY): 1120 SLP Stop Time (ACUTE ONLY): 1215 SLP Time Calculation (min) (ACUTE ONLY): 55 min  Past Medical History:  Past Medical History:  Diagnosis Date  . Arthritis   . Asthma    as a child-no problems since childhood  . Diabetes mellitus without complication ([place])   . Hypertension    Past Surgical History:  Past Surgical History:  Procedure Laterality Date  . BOWEL RESECTION N/A 03/27/2020   Procedure: SMALL BOWEL RESECTION;  Surgeon: Herbert Pun, MD;  Location: [facility] ORS;  Service: General;  Laterality: N/A;  . ILEOSTOMY N/A 03/18/2020   Procedure: ILEOSTOMY CREATION;  Surgeon: Herbert Pun, MD;  Location: [facility] ORS;  Service: General;  Laterality: N/A;  . LAPAROTOMY N/A 03/27/2020   Procedure: EXPLORATORY LAPAROTOMY SUBTOTAL COLECTOMY NEGATIVE PRESSURE DRESSING PLACEMENT ;  Surgeon: Herbert Pun, MD;  Location: [facility] ORS;  Service: General;  Laterality: N/A;  . NO PAST SURGERIES    . TOTAL HIP ARTHROPLASTY Left 04/19/2019   Procedure: TOTAL HIP ARTHROPLASTY ANTERIOR APPROACH;  Surgeon: Hessie Knows, MD;  Location: [facility] ORS;  Service: Orthopedics;  Laterality: Left;  . WOUND DEBRIDEMENT N/A 04/06/2020   Procedure: DEBRIDEMENT CLOSURE/ABDOMINAL WOUND;  Surgeon: Herbert Pun, MD;  Location: [facility] ORS;  Service: General;  Laterality: N/A;   HPI:  Pt is a 64 y.o. male presented to [facility] ED for evaluation of abdominal pain. Patient reports not been able to have a bowel movement in the last 6 days.  He started having worsening abdominal pain.  found to be in shock and had CXR with air under the diaphragm. CT abd with bowel perforation , patient taken to OR emergently found to have 700cc mucopurulent material s/p surgery with resection and post operative septic shock. Patient brought to MICU  on MV with open abdomen for medical management.  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  Pt was orally intubated 9/2-03/19/2020, now on [place] O2 support.  CXR on 03/21/2020: Mild bibasilar subsegmental atelectasis. Small left pleural effusion.    Assessment / Plan / Recommendation Clinical Impression  Pt appears to present w/ grossly adequate oropharyngeal phase swallow w/ No gross oropharyngeal phase dysphagia noted, do not suspect neuromuscular deficits.Pt has an NGT present for TFs for nutrition secondary bowel perforation/surgery and sepsis. Suspect pt's presentation w/ po trials was hampered by presence of the NGT as overt mild throat clearing was inconsistency and seemed to cease post initial trials and strong throat clearing in which pt expectorated thick phlegm. Pt consumed po trials of ice chips and sips of thin liquids w/ No overt coughing or decline in Pulmonary status from his baseline. Pt appears at reduced risk for aspiration following general aspiration precautions. At initial presentation, pt exhibited shallow increased breathing/RR w/ O2 sats 96%, HR 100. During/post po trials, pt exhibited inconsistent, delayed throat clearing initially but no coughing; no decline in vocal quality, or change in respiratory presentation during/post trials. Oral phase appeared [facility] w/ timely bolus management, A-P transfer, and control of liquid boluses includign Jello for A-P propulsion for swallowing. Oral clearing achieved w/ all trial consistencies. Pt gave strong sucks on the straw and needed verbal cues to slow down, drink slowly. OM Exam slightly decreased R labial tone at rest, a pinched look in R corner of mouth which pt stated was baseline - no unilateral lingual weakness noted. Speech intelligible. Strong cough. Recommend  a trial of Clear Liquid diet (Thins) w/ general aspiration precautions and monitoring of respiratory status and/or any overt s/s of aspiration. Meds via NGT. Education given on general  aspiration precautions. NGT feedings to continue as primary nutrition. NSG  SLP Visit Diagnosis: Dysphagia, unspecified (R13.10)    Aspiration Risk  Mild aspiration risk;Risk for inadequate nutrition/hydration (reduced following precautions)    Diet Recommendation  Clear Liquid diet (thin liquid consistency) w/ general aspiration and Reflux precautions; monitoring of status at all meals, assistance  Medication Administration: Via alternative means (NGT)    Other  Recommendations Recommended Consults:  (Dietician following) Oral Care Recommendations: Oral care BID;Oral care before and after PO;Staff/trained caregiver to provide oral care Other Recommendations:  (n/a)   Follow up Recommendations  (TBD)      Frequency and Duration min 3x week  2 weeks       Prognosis Prognosis for Safe Diet Advancement: Fair (-Good) Barriers to Reach Goals: Time post onset;Severity of deficits (GI issues)      Swallow Study   General Date of Onset: 03/18/2020 HPI: Pt is a 64 y.o. male presented to [facility] ED for evaluation of abdominal pain. Patient reports not been able to have a bowel movement in the last 6 days.  He started having worsening abdominal pain.  found to be in shock and had CXR with air under the diaphragm. CT abd with bowel perforation , patient taken to OR emergently found to have 700cc mucopurulent material s/p surgery with resection and post operative septic shock. Patient brought to MICU on MV with open abdomen for medical management.  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  Pt was orally intubated 9/2-03/19/2020, now on [place] O2 support.  CXR on 03/21/2020: Mild bibasilar subsegmental atelectasis. Small left pleural effusion.  Type of Study: Bedside Swallow Evaluation Previous Swallow Assessment: none reported Diet Prior to this Study: NPO;NG Tube (TPN) Temperature Spikes Noted: No (wbc 14.3) Respiratory Status: Nasal cannula (5L) History of Recent Intubation: Yes Length of Intubations  (days): 5 days Date extubated: 03/19/20 Behavior/Cognition: Alert;Cooperative;Pleasant mood;Requires cueing (min; eager to progress) Oral Cavity Assessment: Dry (sticky) Oral Care Completed by SLP: Yes Oral Cavity - Dentition: Edentulous Vision: Functional for self-feeding Self-Feeding Abilities: Able to feed self;Needs assist;Needs set up;Total assist (support) Patient Positioning: Upright in bed (needed positioning support) Baseline Vocal Quality: Normal Volitional Cough: Strong Volitional Swallow: Able to elicit    Oral/Motor/Sensory Function Overall Oral Motor/Sensory Function: Mild impairment Facial ROM: Reduced right (labial - slight) Facial Symmetry: Abnormal symmetry right (slight+) Facial Strength: Within Functional Limits (adequate) Lingual ROM: Within Functional Limits Lingual Symmetry: Within Functional Limits Lingual Strength: Within Functional Limits Velum: Within Functional Limits Mandible: Within Functional Limits   Ice Chips Ice chips: Impaired Presentation: Spoon (fed; 10 trials) Oral Phase Impairments:  (adequate) Oral Phase Functional Implications:  (adequate) Pharyngeal Phase Impairments: Throat Clearing - Delayed (x2) Other Comments: pt has NG present   Thin Liquid Thin Liquid: Impaired Presentation: Spoon;Straw (fed; 5 trials via each) Oral Phase Impairments:  (adequate) Oral Phase Functional Implications:  (adequate) Pharyngeal  Phase Impairments: Throat Clearing - Delayed (x2) Other Comments: pt has NG present    Nectar Thick Nectar Thick Liquid: Not tested   Honey Thick Honey Thick Liquid: Not tested   Puree Puree: Not tested   Solid     Solid: Not tested       Orinda Kenner, MS, CCC-SLP Speech Language Pathologist Rehab Services [phone] Dahlila Pfahler 03/24/2020,12:25 PM

## 2020-03-25 LAB — RENAL FUNCTION PANEL
Albumin: 2.2 g/dL — ABNORMAL LOW (ref 3.5–5.0)
Anion gap: 6 (ref 5–15)
BUN: 40 mg/dL — ABNORMAL HIGH (ref 8–23)
CO2: 26 mmol/L (ref 22–32)
Calcium: 8.2 mg/dL — ABNORMAL LOW (ref 8.9–10.3)
Chloride: 117 mmol/L — ABNORMAL HIGH (ref 98–111)
Creatinine, Ser: 0.87 mg/dL (ref 0.61–1.24)
GFR calc Af Amer: >60 mL/min (ref >60)
GFR calc non Af Amer: >60 mL/min (ref >60)
Glucose, Bld: 254 mg/dL — ABNORMAL HIGH (ref 70–99)
Phosphorus: 2.2 mg/dL — ABNORMAL LOW (ref 2.5–4.6)
Potassium: 4.3 mmol/L (ref 3.5–5.1)
Sodium: 149 mmol/L — ABNORMAL HIGH (ref 135–145)

## 2020-03-25 LAB — GLUCOSE, CAPILLARY
Glucose-Capillary: 174 mg/dL — ABNORMAL HIGH (ref 70–99)
Glucose-Capillary: 186 mg/dL — ABNORMAL HIGH (ref 70–99)
Glucose-Capillary: 203 mg/dL — ABNORMAL HIGH (ref 70–99)
Glucose-Capillary: 212 mg/dL — ABNORMAL HIGH (ref 70–99)
Glucose-Capillary: 296 mg/dL — ABNORMAL HIGH (ref 70–99)
Glucose-Capillary: 303 mg/dL — ABNORMAL HIGH (ref 70–99)
Glucose-Capillary: 315 mg/dL — ABNORMAL HIGH (ref 70–99)

## 2020-03-25 LAB — MAGNESIUM: Magnesium: 2.1 mg/dL (ref 1.7–2.4)

## 2020-03-25 MED ORDER — SODIUM PHOSPHATES 45 MMOLE/15ML IV SOLN
10.0000 mmol | Freq: Once | INTRAVENOUS | Status: AC
  Administered 2020-03-25: 10 mmol via INTRAVENOUS
  Filled 2020-03-25: qty 3.33

## 2020-03-25 MED ORDER — LOPERAMIDE HCL 1 MG/7.5ML PO SUSP
2.0000 mg | Freq: Four times a day (QID) | ORAL | Status: DC
  Administered 2020-03-25 (×2): 2 mg via ORAL
  Filled 2020-03-25 (×4): qty 15

## 2020-03-25 MED ORDER — PANTOPRAZOLE SODIUM 40 MG PO PACK
40.0000 mg | PACK | Freq: Every day | ORAL | Status: DC
  Administered 2020-03-25: 40 mg
  Filled 2020-03-25 (×2): qty 20

## 2020-03-25 MED ORDER — INSULIN GLARGINE 100 UNIT/ML ~~LOC~~ SOLN
25.0000 [IU] | Freq: Two times a day (BID) | SUBCUTANEOUS | Status: DC
  Administered 2020-03-25: 25 [IU] via SUBCUTANEOUS
  Filled 2020-03-25 (×2): qty 0.25

## 2020-03-25 NOTE — Plan of Care (Signed)

## 2020-03-25 NOTE — Progress Notes (Signed)
[place] Hospital Day(s): 11.   Post op day(s): 9 Days Post-Op.   Interval History: Patient seen and examined, no acute events or new complaints overnight. Patient reports feeling well. Denies any complain. Patient more active and moving head with more control. Denies nausea or vomiting.  Vital signs in last 24 hours: [min-max] current  Temp:  [97.8 F (36.6 C)-99.6 F (37.6 C)] 99.2 F (37.3 C) (09/12 0800) Pulse Rate:  [88-97] 88 (09/12 1200) Resp:  [18-20] 20 (09/12 0544) BP: (99-138)/(63-76) 99/63 (09/12 1200) SpO2:  [93 %-96 %] 96 % (09/12 1200) Weight:  [86 kg] 86 kg (09/12 0400)     Height: 5' 7.99" (172.7 cm) Weight: 86 kg BMI (Calculated): 28.83   Physical Exam:  Constitutional: alert, cooperative and no distress  Respiratory: breathing non-labored at rest  Cardiovascular: regular rate and sinus rhythm  Gastrointestinal: soft, non-tender, and non-distended. Pink and patent.   Labs:  CBC Latest Ref Rng & Units 03/21/2020 03/20/2020 03/19/2020  WBC 4.0 - 10.5 K/uL 14.3(H) 14.4(H) 12.0(H)  Hemoglobin 13.0 - 17.0 g/dL 13.3 12.4(L) 11.2(L)  Hematocrit 39 - 52 % 41.9 38.9(L) 36.1(L)  Platelets 150 - 400 K/uL 161 125(L) 94(L)   CMP Latest Ref Rng & Units 03/25/2020 03/24/2020 03/23/2020  Glucose 70 - 99 mg/dL 254(H) 328(H) 365(H)  BUN 8 - 23 mg/dL 40(H) 42(H) 34(H)  Creatinine 0.61 - 1.24 mg/dL 0.87 0.93 0.91  Sodium 135 - 145 mmol/L 149(H) 150(H) 150(H)  Potassium 3.5 - 5.1 mmol/L 4.3 4.3 4.3  Chloride 98 - 111 mmol/L 117(H) 116(H) 112(H)  CO2 22 - 32 mmol/L 26 26 29   Calcium 8.9 - 10.3 mg/dL 8.2(L) 8.1(L) 8.0(L)  Total Protein 6.5 - 8.1 g/dL - - -  Total Bilirubin 0.3 - 1.2 mg/dL - - -  Alkaline Phos 38 - 126 U/L - - -  AST 15 - 41 U/L - - -  ALT 0 - 44 U/L - - -    Imaging studies: No new pertinent imaging studies   Assessment/Plan:  64 y.o.malewith descending colon obstructive mass with cecum perforation11Day Post-Ops/p subtotal colectomyand  interval reopening of recent laparotomy with drainage of intra-abdominal abscess and ileostomy creation postoperative day #9, complicated by pertinent comorbidities including, respiratory failure mechanical ventilation due to hypoxia(resolved),septic shock(resolved), acute kidney injury(resolved), diabetes, hypertension, and newly diagnosed malignant neoplasm of the splenic flexure.  Patient recovering slowly. Tolerating tube feedings to goal. Plan is to transition to oral probably tomorrow. Will discuss with dietitian. I will increase frequency of imodium due to increasing output through ileostomy.  Wound doing good with VAC system. Will change dressing tomorrow.    Appreciate PT and swallow for evaluation and management. Patient is definitely doing better but still severely debilitated. Every day gaining strength with PT.   Appreciate Hospitalist and Cardiologist for the management of medical comorbidities of CHF, Hypertension, diabetes among others.   Arnold Long, MD

## 2020-03-25 NOTE — Progress Notes (Signed)
PROGRESS NOTE    Donald Stephenson.  AVW:[id number]  DOB: 1956-01-26  DOA: 03/16/2020 PCP: Donnamarie Rossetti, PA-C Outpatient Specialists:   Hospital course:  64 year old man with DM2, HTN was admitted 03/29/2020 with bowel perforation.  He was treated with exploratory laparotomy requiring subtotal colectomy with the result of a new diagnosis of metastatic colon cancer.    Subjective:  Patient has no complaints, states "I guess I am doing fine".  Patient denies any pain.  No shortness of breath or chest pain.   Objective: Vitals:   03/25/20 1200 03/25/20 1300 03/25/20 1400 03/25/20 1500  BP: 99/63   106/70  Pulse: 88 89 92 89  Resp:    20  Temp:    97.7 F (36.5 C)  TempSrc:    Oral  SpO2: 96% 96% 94% 96%  Weight:      Height:        Intake/Output Summary (Last 24 hours) at 03/25/2020 1742 Last data filed at 03/25/2020 1220 Gross per 24 hour  Intake 918 ml  Output 1835 ml  Net -917 ml   Filed Weights   03/23/20 0434 03/24/20 0641 03/25/20 0400  Weight: 81.5 kg 85.7 kg 86 kg     Exam:  General: Listless gentleman lying in bed in no acute distress Eyes: sclera anicteric, conjuctiva mild injection bilaterally CVS: S1-S2, regular  Respiratory:  decreased air entry bilaterally secondary to decreased inspiratory effort, rales at bases  GI: She does have bowel sounds.  His abdomen is soft.  Is not distended.  Midline scar appears to be healing well. LE: No edema.  Neuro: grossly nonfocal.    Assessment & Plan:   Continue present management as previously established. Given persistent hyperglycemia, will increase glargine from 20 twice daily to 25 twice daily.  The following is copied and pasted from Dr. Serita Grit note from yesterday: Septic shock due to perforated colon Acute respiratory failrue with hypoxia due to septic shock S/p ex-lap, subtotal colectomy and negative pressure dressing by Dr. Windell Moment 9/1 S/p small bowel resection, replaced negative  pressure dressing, I&D, and ileostomy creation by Dr. Peyton Najjar 9/3 Patient presented with tachycardia, tachypnea, AMS, and lactate 7 mmol/L, found to have free air in abdomen, taken for emergent ex-lap. -Taken back to ICU intubated with negative pressure dressing. Repeat ex-lap 9/3 with ileostomy creation. -9/6 Extubated  -9/7transferred out of ICU  -abdominal wound  cultures growing only Bacteroides, kleibsiella, e coli -Given severity of infection, pt completed IV anidulafungin 7 total days  -IV Flagyl, ceftriaxone (day 10/14) -TPN per Surgery--to be weaned off today -9/9--ok to start TUBE feeding at slow rate per surgery -9/10 tolerating TF well. Started PT sat at the edge of the bed -9/11 pt will be weaned off TPN at 1800 hers, TF at goal, Speech therapy started CLD  Newly diagnosed metastatic adenoCA of colon -Consult Oncology -- patient seen by Dr. Chauncey Cruel follow at later date as out pt  Diabetes type 2 with hyperglycemia without long term insulin Glucoses elevated -increase Lantus to 25 twice daily as noted above  -HoldJardiance, metformin, glimepiride  Cardiomyopathy Ejection fraction 35 to 40%, On carvedilol, losartan Given high blood pressure will increase doses of losartan and carvedilol  Hypertension -continue Coreg losartan  Pressure injury, stage II coccyx, not POA continue wound management  Acute renal failure due to septic shock Resolved  GI protection -ContinuePPI  Hypernatremia -Start hypotonic fluids-- D5 water  Thrombocytopenia Mild, resolving, no bleeding  Foley removed 9/8   DVT  prophylaxis: Enoxaparin Code Status: Full Family Communication: None present Disposition Plan:   Patient is from: Home  Anticipated Discharge Location: SNF  Barriers to Discharge: Still acutely ill  Is patient medically stable for Discharge: No  Data Reviewed:  Basic Metabolic Panel: Recent Labs  Lab 03/21/20 0552 03/21/20 0552  03/21/20 1647 03/22/20 0704 03/23/20 0548 03/24/20 1136 03/25/20 0329  NA 147*  147*  --   --  148* 150* 150* 149*  K 3.3*  3.2*   < > 3.5 3.3* 4.3 4.3 4.3  CL 107  107  --   --  108 112* 116* 117*  CO2 31  30  --   --  27 29 26 26   GLUCOSE 392*  387*  --   --  338* 365* 328* 254*  BUN 29*  28*  --   --  28* 34* 42* 40*  CREATININE 0.86  0.80  --   --  0.78 0.91 0.93 0.87  CALCIUM 7.5*  7.5*  --   --  7.9* 8.0* 8.1* 8.2*  MG 1.9  --   --  1.9 2.2 2.1 2.1  PHOS 2.2*  2.3*  --   --  2.2* 3.0 2.4* 2.2*   < > = values in this interval not displayed.   Liver Function Tests: Recent Labs  Lab 03/21/20 0552 03/22/20 0704 03/23/20 0548 03/24/20 1136 03/25/20 0329  AST 79* 43*  --   --   --   ALT 177* 117*  --   --   --   ALKPHOS 101 92  --   --   --   BILITOT 1.2 0.8  --   --   --   PROT 5.7* 5.8*  --   --   --   ALBUMIN 2.3*  2.3* 2.4* 2.2* 2.1* 2.2*   No results for input(s): LIPASE, AMYLASE in the last 168 hours. No results for input(s): AMMONIA in the last 168 hours. CBC: Recent Labs  Lab 03/19/20 0445 03/20/20 0606 03/21/20 0552  WBC 12.0* 14.4* 14.3*  NEUTROABS 10.2* 12.4* 12.8*  HGB 11.2* 12.4* 13.3  HCT 36.1* 38.9* 41.9  MCV 93.8 92.6 90.9  PLT 94* 125* 161   Cardiac Enzymes: No results for input(s): CKTOTAL, CKMB, CKMBINDEX, TROPONINI in the last 168 hours. BNP (last 3 results) No results for input(s): PROBNP in the last 8760 hours. CBG: Recent Labs  Lab 03/25/20 0036 03/25/20 0448 03/25/20 0805 03/25/20 1156 03/25/20 1516  GLUCAP 212* 203* 174* 186* 303*    No results found for this or any previous visit (from the past 240 hour(s)).    Studies: No results found.   Scheduled Meds: . carvedilol  12.5 mg Oral BID WC  . chlorhexidine  15 mL Mouth Rinse BID  . Chlorhexidine Gluconate Cloth  6 each Topical Daily  . enoxaparin (LOVENOX) injection  40 mg Subcutaneous Q24H  . feeding supplement (PIVOT 1.5 CAL)  1,000 mL Per Tube Q24H  .  free water  150 mL Per Tube Q4H  . insulin aspart  0-20 Units Subcutaneous Q4H  . insulin glargine  20 Units Subcutaneous BID  . loperamide HCl  2 mg Oral Q6H  . losartan  50 mg Oral Daily  . mouth rinse  15 mL Mouth Rinse q12n4p  .  morphine injection  0.5 mg Intravenous Once  . nutrition supplement (JUVEN)  1 packet Per Tube BID BM  . pantoprazole sodium  40 mg Per Tube Daily   Continuous  Infusions: . sodium chloride 500 mL (03/24/20 0423)  . cefTRIAXone (ROCEPHIN)  IV 2 g (03/25/20 1018)  . metronidazole 500 mg (03/25/20 1112)  . sodium phosphate  Dextrose 5% IVPB 10 mmol (03/25/20 1541)    Active Problems:   Bowel perforation (HCC)   Cancer of descending colon (HCC)   Pressure injury of skin   Intra-abdominal free air of unknown etiology   SOB (shortness of breath)   Diabetes mellitus ([place])   Nonspecific abnormal electrocardiogram (ECG) (EKG)   HFrEF (heart failure with reduced ejection fraction) ([place])   Essential hypertension     Jordyan Hardiman Derek Jack, [facility]  If 7PM-7AM, please contact night-coverage [email or web address] Password [email or web address] 03/25/2020, 5:42 PM    LOS: 11 days

## 2020-03-25 NOTE — Plan of Care (Signed)
  Problem: Education: Goal: Knowledge of General Education information will improve Description: Including pain rating scale, medication(s)/side effects and non-pharmacologic comfort measures 03/25/2020 1830 by Rometta Emery, RN Outcome: Progressing 03/25/2020 1455 by Rometta Emery, RN Outcome: Progressing   Problem: Health Behavior/Discharge Planning: Goal: Ability to manage health-related needs will improve 03/25/2020 1830 by Rometta Emery, RN Outcome: Progressing 03/25/2020 1455 by Rometta Emery, RN Outcome: Progressing   Problem: Clinical Measurements: Goal: Ability to maintain clinical measurements within normal limits will improve 03/25/2020 1830 by Rometta Emery, RN Outcome: Progressing 03/25/2020 1455 by Rometta Emery, RN Outcome: Progressing Goal: Will remain free from infection 03/25/2020 1830 by Rometta Emery, RN Outcome: Progressing 03/25/2020 1455 by Rometta Emery, RN Outcome: Progressing Goal: Diagnostic test results will improve 03/25/2020 1830 by Rometta Emery, RN Outcome: Progressing 03/25/2020 1455 by Rometta Emery, RN Outcome: Progressing Goal: Respiratory complications will improve 03/25/2020 1830 by Rometta Emery, RN Outcome: Progressing 03/25/2020 1455 by Rometta Emery, RN Outcome: Progressing Goal: Cardiovascular complication will be avoided 03/25/2020 1830 by Rometta Emery, RN Outcome: Progressing 03/25/2020 1455 by Rometta Emery, RN Outcome: Progressing   Problem: Activity: Goal: Risk for activity intolerance will decrease 03/25/2020 1830 by Rometta Emery, RN Outcome: Progressing 03/25/2020 1455 by Rometta Emery, RN Outcome: Progressing   Problem: Nutrition: Goal: Adequate nutrition will be maintained 03/25/2020 1830 by Rometta Emery, RN Outcome: Progressing 03/25/2020 1455 by Rometta Emery, RN Outcome: Progressing   Problem: Coping: Goal: Level of anxiety will decrease 03/25/2020  1830 by Rometta Emery, RN Outcome: Progressing 03/25/2020 1455 by Rometta Emery, RN Outcome: Progressing   Problem: Elimination: Goal: Will not experience complications related to bowel motility 03/25/2020 1830 by Rometta Emery, RN Outcome: Progressing 03/25/2020 1455 by Rometta Emery, RN Outcome: Progressing Goal: Will not experience complications related to urinary retention 03/25/2020 1830 by Rometta Emery, RN Outcome: Progressing 03/25/2020 1455 by Rometta Emery, RN Outcome: Progressing   Problem: Pain Managment: Goal: General experience of comfort will improve 03/25/2020 1830 by Rometta Emery, RN Outcome: Progressing 03/25/2020 1455 by Rometta Emery, RN Outcome: Progressing   Problem: Safety: Goal: Ability to remain free from injury will improve 03/25/2020 1830 by Rometta Emery, RN Outcome: Progressing 03/25/2020 1455 by Rometta Emery, RN Outcome: Progressing   Problem: Skin Integrity: Goal: Risk for impaired skin integrity will decrease 03/25/2020 1830 by Rometta Emery, RN Outcome: Progressing 03/25/2020 1455 by Rometta Emery, RN Outcome: Progressing

## 2020-03-26 ENCOUNTER — Inpatient Hospital Stay: Payer: BC Managed Care – PPO

## 2020-03-26 DIAGNOSIS — I469 Cardiac arrest, cause unspecified: Secondary | ICD-10-CM

## 2020-03-26 LAB — CBC WITH DIFFERENTIAL/PLATELET
Abs Immature Granulocytes: 0.94 10*3/uL — ABNORMAL HIGH (ref 0.00–0.07)
Basophils Absolute: 0 10*3/uL (ref 0.0–0.1)
Basophils Relative: 0 %
Eosinophils Absolute: 0.1 10*3/uL (ref 0.0–0.5)
Eosinophils Relative: 1 %
HCT: 29.3 % — ABNORMAL LOW (ref 39.0–52.0)
Hemoglobin: 8.7 g/dL — ABNORMAL LOW (ref 13.0–17.0)
Immature Granulocytes: 5 %
Lymphocytes Relative: 29 %
Lymphs Abs: 5 10*3/uL — ABNORMAL HIGH (ref 0.7–4.0)
MCH: 29.5 pg (ref 26.0–34.0)
MCHC: 29.7 g/dL — ABNORMAL LOW (ref 30.0–36.0)
MCV: 99.3 fL (ref 80.0–100.0)
Monocytes Absolute: 0.6 10*3/uL (ref 0.1–1.0)
Monocytes Relative: 3 %
Neutro Abs: 10.6 10*3/uL — ABNORMAL HIGH (ref 1.7–7.7)
Neutrophils Relative %: 62 %
Platelets: 113 10*3/uL — ABNORMAL LOW (ref 150–400)
RBC: 2.95 MIL/uL — ABNORMAL LOW (ref 4.22–5.81)
RDW: 14.1 % (ref 11.5–15.5)
WBC: 17.3 10*3/uL — ABNORMAL HIGH (ref 4.0–10.5)
nRBC: 0.3 % — ABNORMAL HIGH (ref 0.0–0.2)

## 2020-03-26 LAB — COMPREHENSIVE METABOLIC PANEL
ALT: 59 U/L — ABNORMAL HIGH (ref 0–44)
AST: 103 U/L — ABNORMAL HIGH (ref 15–41)
Albumin: 1.3 g/dL — ABNORMAL LOW (ref 3.5–5.0)
Alkaline Phosphatase: 59 U/L (ref 38–126)
Anion gap: 13 (ref 5–15)
BUN: 39 mg/dL — ABNORMAL HIGH (ref 8–23)
CO2: 23 mmol/L (ref 22–32)
Calcium: 6.2 mg/dL — CL (ref 8.9–10.3)
Chloride: 118 mmol/L — ABNORMAL HIGH (ref 98–111)
Creatinine, Ser: 0.98 mg/dL (ref 0.61–1.24)
GFR calc Af Amer: >60 mL/min (ref >60)
GFR calc non Af Amer: >60 mL/min (ref >60)
Glucose, Bld: 339 mg/dL — ABNORMAL HIGH (ref 70–99)
Potassium: 3.7 mmol/L (ref 3.5–5.1)
Sodium: 154 mmol/L — ABNORMAL HIGH (ref 135–145)
Total Bilirubin: 0.5 mg/dL (ref 0.3–1.2)
Total Protein: 3.6 g/dL — ABNORMAL LOW (ref 6.5–8.1)

## 2020-03-26 LAB — PHOSPHORUS: Phosphorus: 6.1 mg/dL — ABNORMAL HIGH (ref 2.5–4.6)

## 2020-03-26 LAB — MAGNESIUM: Magnesium: 1.9 mg/dL (ref 1.7–2.4)

## 2020-03-26 LAB — FIBRINOGEN: Fibrinogen: 393 mg/dL (ref 210–475)

## 2020-03-26 LAB — PROTIME-INR
INR: 1.6 — ABNORMAL HIGH (ref 0.8–1.2)
Prothrombin Time: 18.4 seconds — ABNORMAL HIGH (ref 11.4–15.2)

## 2020-03-26 LAB — TROPONIN I (HIGH SENSITIVITY): Troponin I (High Sensitivity): 76 ng/L — ABNORMAL HIGH (ref <18)

## 2020-03-26 MED ORDER — EPINEPHRINE HCL 5 MG/250ML IV SOLN IN NS
0.5000 ug/min | INTRAVENOUS | Status: DC
  Filled 2020-03-26: qty 250

## 2020-03-26 MED ORDER — NOREPINEPHRINE 4 MG/250ML-% IV SOLN
INTRAVENOUS | Status: AC
  Filled 2020-03-26: qty 250

## 2020-03-26 MED ORDER — SODIUM BICARBONATE 8.4 % IV SOLN
INTRAVENOUS | Status: AC
  Filled 2020-03-26: qty 50

## 2020-03-26 MED FILL — Medication: Qty: 1 | Status: AC

## 2020-04-13 NOTE — Progress Notes (Signed)
Patient arrived on unit at 0040 from PCU after a code blue.  Immediately upon arrival into room, pt noted to be in PEA and CPR initiated.  Patient proceeded to attain ROSC briefly before losing pulses again multiple times thereafter.  Pronounced by Harless Nakayama, NP at bedside at [place].  See code blue sheets for complete details.  Patient's family arrived and brought to room by chaplain.  Belongings returned to pt's wife-Sandra.

## 2020-04-13 NOTE — Progress Notes (Signed)
Ch met with Pt's daughter [name] and doctor, at the ICU waiting area. Ashley tearful while waiting on update about dad. Ch went to get her husband from the ED after hearing of Pt's passing.    01:31 a.m.   Ch escorted Pt's wife [name], and Pt's other daughter April to ICU waiting area. Ch present when family entered to see Pt's body. Ch provided appropriate pastoral support. Family let Ch know that they were waiting on Pt's sister to arrive, so we can pray together. Ch requested Nurse secretary to page after Pt's sister arrives.  03:07 a.m.   Ch paged to escort family to ED. Ch escorted seven family members from ICU waiting area to ED waiting area. They were grateful for support.

## 2020-04-13 NOTE — Discharge Summary (Signed)
[facility] Death Note                                                                                                                                                                                               Donald Stephenson, is a 64 y.o. male, DOB - 26-May-1956, YYT:[id number]  Admit date - 03/18/2020   Admitting Physician Donald Pun, MD  Outpatient Primary MD for the patient is Donald Stephenson, Donald Jay, PA-C  LOS - 12  Chief Complaint  Patient presents with  . Constipation       Notification: Donald Rossetti, PA-C notified of death of 10-Apr-2020   Date and Time of Death 04/04/2020 at 0122  Pronounced by Donald Nakayama NP  History of present illness:   Donald Stephenson. is a 64 y.o. male with DM2, HTN was admitted 04/08/2020 with bowel perforation.  He was treated with emergency exploratory laparotomy and found to have perforated viscus requiring subtotal colectomy with the result of a new diagnosis of metastatic colon cancer.    Patient was recovering after prolonged course as noted below.  However the night that he died patient was noted to have new oxygen requirement with O2 sat in the mid 70s.  He also was less responsive than he had been earlier in the day.  For any further investigations could be done however, patient became pulseless and stopped breathing.  CODE BLUE was called and he was intubated and resuscitated and regained a pulse and a heart rate of 158 with good blood pressure.  He was transferred to the ICU however after arriving in the ICU he was found once again to be in PEA with no pulse.  This time CODE BLUE was unsuccessful and patient was pronounced at 1:22 AM.   Patient's disease course after his admission with emergent exploratory laparotomy is is below.  Septic shock due to perforated colon Acute respiratory failrue with hypoxia due to septic shock S/p ex-lap, subtotal colectomy and negative pressure dressing by Dr.  Windell Stephenson 9/1 S/p small bowel resection, replaced negative pressure dressing, I&D, and ileostomy creation by Dr. Peyton Stephenson 9/3 Patient presented with tachycardia, tachypnea, AMS, and lactate 7 mmol/L, found to have free air in abdomen, taken for emergent ex-lap. -Taken back to ICU intubated with negative pressure dressing. Repeat ex-lap 9/3 with ileostomy creation. -9/6 Extubated  -9/7transferred out of ICU  -abdominal wound  cultures growing only Bacteroides, kleibsiella, e coli -Given severity of infection, pt completed IV anidulafungin 7 total days  -IV Flagyl, ceftriaxone (day 10/14) -TPN per Surgery--to be weaned off today -9/9--ok to start TUBE feeding at slow rate  per surgery -9/10 tolerating TF well. Started PT sat at the edge of the bed -9/11 pt will be weaned off TPN at 1800 hers, TF at goal, Speech therapy started CLD  Newly diagnosed metastatic adenoCA of colon -Consult Oncology -- patient seen by Donald Stephenson follow at later date as out pt  Prolonged QTC noted on EKG -potassium 3.2--3.7 -pharmacy consult placed for replacement  -[facility] cardiology input appreciated  Diabetes type 2 with hyperglycemia without long term insulin Glucoses elevated -increase Lantus to Bid for better sugar contrl -HoldJardiance, metformin, glimepiride  Cardiomyopathy Ejection fraction 35 to 40%, On carvedilol, losartan Given high blood pressure will increase doses of losartan and carvedilol  Hypertension -continue Coreg losartan  Pressure injury, stage II coccyx, not POA continue wound management  Acute renal failure due to septic shock Resolved  GI protection -ContinuePPI  Hypernatremia -Start hypotonic fluids-- D5 water  Thrombocytopenia Mild, resolving, no bleeding     Final Diagnoses:  Cause if death -PEA arrest of unclear etiology.  Possibilities include new sepsis possibly secondary to aspiration versus other infection.  Patient was  enterally so weak that he would not tolerate any further insults and succumbed and was pronounced.  Signature  Donald Stephenson M.D on 04/01/2020 at 5:24 PM  [facility]  Office Phone [phone]  Total clinical and documentation time for today Under 30 minutes   Last Note

## 2020-04-13 NOTE — Code Documentation (Signed)
Code blue call on patient at [facility].  Responded to code intubated.  Upon arrival, CPR was in progress.  Nurse indicated that patient's SPO2 was noted to be in the 50s and as patient was being assessed he became unresponsive and pulseless.  CPR was started immediately.  Patient intubated by RT.  ED responded to code and primary team attending Dr. Sidney Ace and primary team nurse practitioner at bedside.  Patient regained pulse and was being transferred to the ICU when he became pulseless again.  CPR resumed upon arrival in the ICU and patient was resuscitated for a total of 63 minutes without return of spontaneous circulation.  Code called.  Primary team nurse practitioner Hassan Rowan at bedside.  Patient received multiple doses of epinephrine and bicarb.  Death certificate and death summary to be completed by primary team.  Arlyss Gandy. [facility] ANP-BC Pulmonary and Critical Care Medicine [facility] Pager [phone] or [phone]  NB: This document was prepared using Dragon voice recognition software and may include unintentional dictation errors.

## 2020-04-13 NOTE — Progress Notes (Signed)
RTx3 responded to code blue. Patient successfully intubated by this RT, 7.5ETT secured at 22cm at the lip. Patient was bagged throughout the remainder of the code and transferred to ICU. ETT removed post code per NP verbal orders.

## 2020-04-13 NOTE — Progress Notes (Signed)
Pt referred to [facility] per bedside RN request. Referral # [id number]. Spoke with Bethena Roys.  Pt is possible eye donation. Ordered to do eye prep once family leaves.  Bedside RN notified.  TOD 0122.  Info also documented in doc flowsheet.

## 2020-04-13 NOTE — Progress Notes (Signed)
Assumed care around 1900. Patient A/O x4 at start of shift and able to follow commands. VSS. Around 0010 this RN went into the room to readjust and empty drains. Patient was sitting up and responding fine, then his oxygen saturation dropped from 98 down to the 70s and continues to drop lower. RN got a portable pulsed ox machine to cross reference but was unable to get a reading on the portable one. Patient then began to have increased breathing effort and no longer responded to my questions. Respiratory and RR called, oxygen dropped down to the 30s, this RN placed non rebreather on patient and suctioned secretions from patient. Patient continued to decline, code blue was called. Able to get ROSS back and patient transferred to ICU. Family called and updated , [name] will be coming into the hospital. On arrive to ICE patient coded again. Nursing staff and NP at bedside. Will give report as soon as patients nurse is available to take report.

## 2020-04-13 NOTE — ED Provider Notes (Signed)
[facility] Department of Emergency Medicine   Code Blue CONSULT NOTE  Chief Complaint: Cardiac arrest/unresponsive   Level V Caveat: Unresponsive  History of present illness: I was contacted by the hospital for a CODE BLUE cardiac arrest upstairs and presented to the patient's bedside.   Hospitalized for bowel perforation, post-op, now on hospitalist service.  Bedside RN reports that she noted the patient was less responsive and had spO2 in the 88s.  As she continued to evaluate the patient, he stopped breathing and lost pulses.  She called CODE BLUE.  ROS: Unable to obtain, Level V caveat  Scheduled Meds: . carvedilol  12.5 mg Oral BID WC  . chlorhexidine  15 mL Mouth Rinse BID  . Chlorhexidine Gluconate Cloth  6 each Topical Daily  . enoxaparin (LOVENOX) injection  40 mg Subcutaneous Q24H  . feeding supplement (PIVOT 1.5 CAL)  1,000 mL Per Tube Q24H  . free water  150 mL Per Tube Q4H  . insulin aspart  0-20 Units Subcutaneous Q4H  . insulin glargine  25 Units Subcutaneous BID  . loperamide HCl  2 mg Oral Q6H  . losartan  50 mg Oral Daily  . mouth rinse  15 mL Mouth Rinse q12n4p  .  morphine injection  0.5 mg Intravenous Once  . nutrition supplement (JUVEN)  1 packet Per Tube BID BM  . pantoprazole sodium  40 mg Per Tube Daily   Continuous Infusions: . sodium chloride 500 mL (03/24/20 0423)  . cefTRIAXone (ROCEPHIN)  IV 2 g (03/25/20 1018)  . metronidazole 500 mg (03/25/20 1835)   PRN Meds:.sodium chloride, acetaminophen **OR** acetaminophen, hydrALAZINE, HYDROcodone-acetaminophen, ondansetron **OR** ondansetron (ZOFRAN) IV Past Medical History:  Diagnosis Date  . Arthritis   . Asthma    as a child-no problems since childhood  . Diabetes mellitus without complication ([place])   . Hypertension    Past Surgical History:  Procedure Laterality Date  . BOWEL RESECTION N/A 03/17/2020   Procedure: SMALL BOWEL RESECTION;  Surgeon: Herbert Pun, MD;   Location: [facility] ORS;  Service: General;  Laterality: N/A;  . ILEOSTOMY N/A 04/10/2020   Procedure: ILEOSTOMY CREATION;  Surgeon: Herbert Pun, MD;  Location: [facility] ORS;  Service: General;  Laterality: N/A;  . LAPAROTOMY N/A 04/09/2020   Procedure: EXPLORATORY LAPAROTOMY SUBTOTAL COLECTOMY NEGATIVE PRESSURE DRESSING PLACEMENT ;  Surgeon: Herbert Pun, MD;  Location: [facility] ORS;  Service: General;  Laterality: N/A;  . NO PAST SURGERIES    . TOTAL HIP ARTHROPLASTY Left 04/19/2019   Procedure: TOTAL HIP ARTHROPLASTY ANTERIOR APPROACH;  Surgeon: Hessie Knows, MD;  Location: [facility] ORS;  Service: Orthopedics;  Laterality: Left;  . WOUND DEBRIDEMENT N/A 03/21/2020   Procedure: DEBRIDEMENT CLOSURE/ABDOMINAL WOUND;  Surgeon: Herbert Pun, MD;  Location: [facility] ORS;  Service: General;  Laterality: N/A;   Social History   Socioeconomic History  . Marital status: Married    Spouse name: Not on file  . Number of children: Not on file  . Years of education: Not on file  . Highest education level: Not on file  Occupational History  . Not on file  Tobacco Use  . Smoking status: Former Smoker    Years: 2.00    Types: Cigarettes    Quit date: 04/10/1969    Years since quitting: 50.9  . Smokeless tobacco: Never Used  Vaping Use  . Vaping Use: Never used  Substance and Sexual Activity  . Alcohol use: Yes    Comment: 2-3 shots and 1 beer daily  .  Drug use: Yes    Frequency: 5.0 times per week    Types: Marijuana  . Sexual activity: Not on file  Other Topics Concern  . Not on file  Social History Narrative  . Not on file   Social Determinants of Health   Financial Resource Strain:   . Difficulty of Paying Living Expenses: Not on file  Food Insecurity:   . Worried About Charity fundraiser in the Last Year: Not on file  . Ran Out of Food in the Last Year: Not on file  Transportation Needs:   . Lack of Transportation (Medical): Not on file  . Lack of Transportation  (Non-Medical): Not on file  Physical Activity:   . Days of Exercise per Week: Not on file  . Minutes of Exercise per Session: Not on file  Stress:   . Feeling of Stress : Not on file  Social Connections:   . Frequency of Communication with Friends and Family: Not on file  . Frequency of Social Gatherings with Friends and Family: Not on file  . Attends Religious Services: Not on file  . Active Member of Clubs or Organizations: Not on file  . Attends Archivist Meetings: Not on file  . Marital Status: Not on file  Intimate Partner Violence:   . Fear of Current or Ex-Partner: Not on file  . Emotionally Abused: Not on file  . Physically Abused: Not on file  . Sexually Abused: Not on file   Allergies  Allergen Reactions  . Bee Venom Swelling  . Other Hives    ants    Last set of Vital Signs (not current) Vitals:   03/25/20 1500 03/25/20 2300  BP: 106/70 115/66  Pulse: 89 94  Resp: 20 20  Temp: 97.7 F (36.5 C) 98.3 F (36.8 C)  SpO2: 96%       Physical Exam  Gen: unresponsive Cardiovascular: pulseless  Resp: apneic. Breath sounds equal bilaterally with bagging.  Patient already intubated by RT team prior to my arrival. Abd: nondistended  Neuro: GCS 3, unresponsive to pain  HEENT: gag reflex absent  Neck: No crepitus  Musculoskeletal: No deformity  Skin: warm  Procedures (when applicable, including Critical Care time): Procedures   MDM / Assessment and Plan Hospitalist (Dr. Sidney Ace) and ICU NP [facility]) both present in room.  CPR proceeding as per ACLS protocol.  Dr. Sidney Ace taking over code.  Airway already established by RT team.  No further role for me, returning to ED while hospitalist/ICU teams further manage patient.    Hinda Kehr, MD 04-03-2020 [phone]

## 2020-04-13 NOTE — Progress Notes (Signed)
Ch arrived at room in response to RR page. Pt coded while care team was working on him. Pt moved to ICU after pulse was gotten back. Family not present at this time, Ch will follow-up.  12:51 a.m.   Ch paged about Pt coding again. Pt's wife on the way to the hospital. Ch will follow up soon.

## 2020-04-13 NOTE — Code Documentation (Signed)
The patient was found unresponsive and pulseless.  A CODE BLUE was called at 00 15.  He was in PEA cardiac arrest.  CPR was immediately started and ACLS was followed.  The patient was  intubated.  He received a total of 4 mg of IV epinephrine and 1 mg of IV atropine as well as 1 ampoule of sodium bicarbonate and wide open IV normal saline.  He regained his pulse and 00: 29 with a heart rate of 158 and a blood pressure of 113/69.  He was then transferred to the ICU.

## 2020-04-13 DEATH — deceased

## 2021-07-24 IMAGING — CT CT ABD-PELV W/O CM
2 of 4 series · 12 of 36 positions shown, 15 images · non-contrast
Comparison: No priors.

CLINICAL DATA: 64-year-old male with history of abdominal pain.
Suspected aortic dissection.

EXAM:
CT CHEST, ABDOMEN AND PELVIS WITHOUT CONTRAST
TECHNIQUE: Multidetector CT imaging of the chest, abdomen and pelvis was
performed following the standard protocol without IV contrast.

[Series 2: cap (person_name) (person_name) · axial · 0.84mm/px · z∈[-610,-70]mm · 9 of 128 slices shown, 12 images]
[im 10/128  mediastinal]
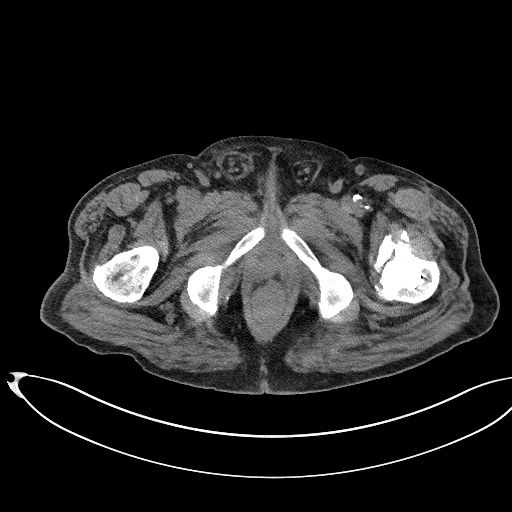
[im 10/128  lung]
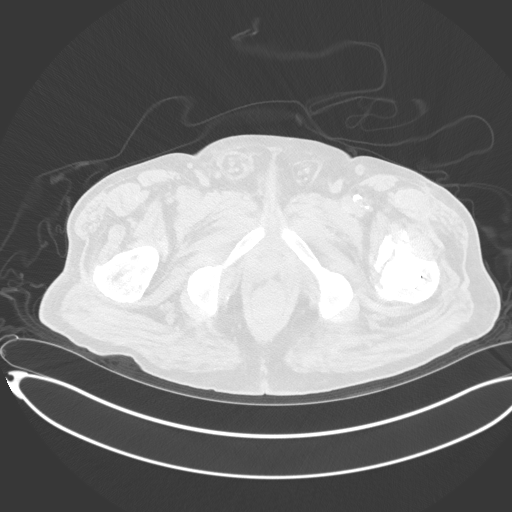
[im 30/128  lung]
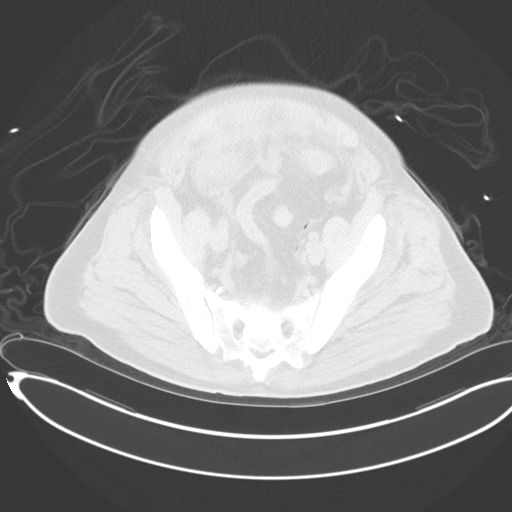
[im 40/128  lung]
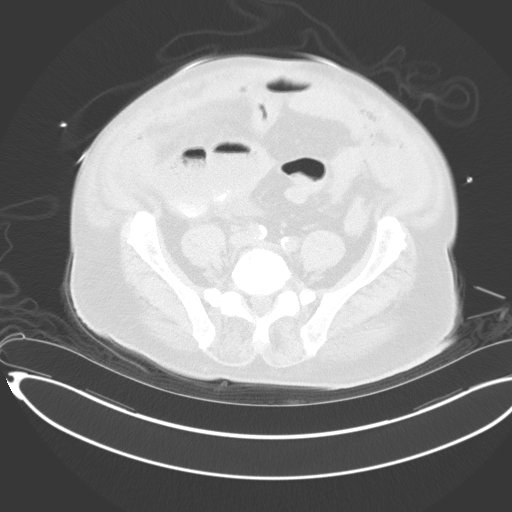
[im 49/128  lung]
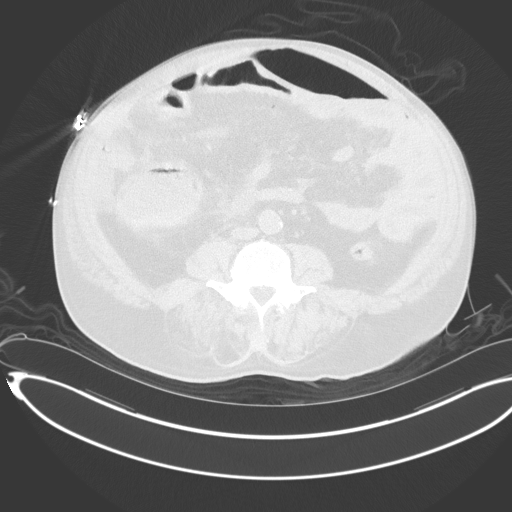
[im 69/128  mediastinal]
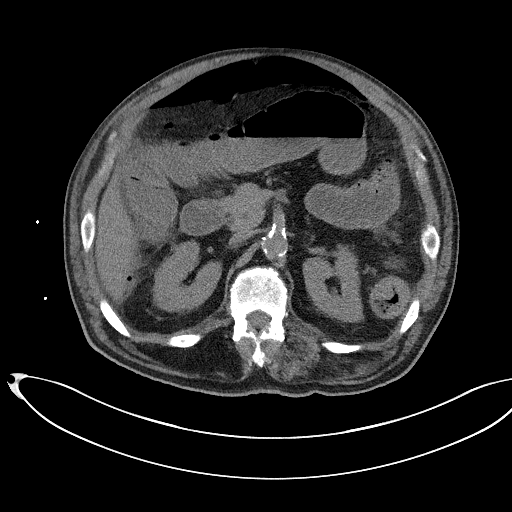
[im 69/128  lung]
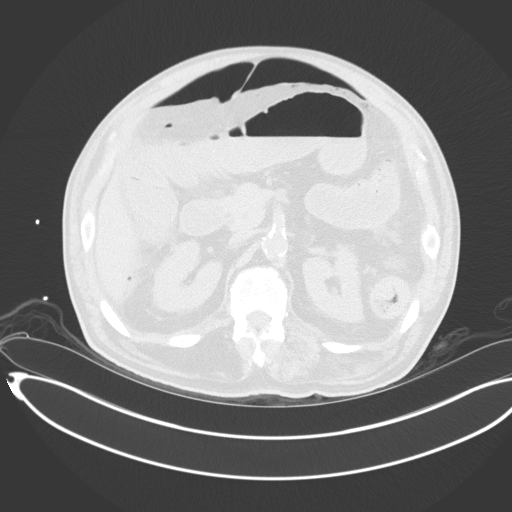
[im 79/128  lung]
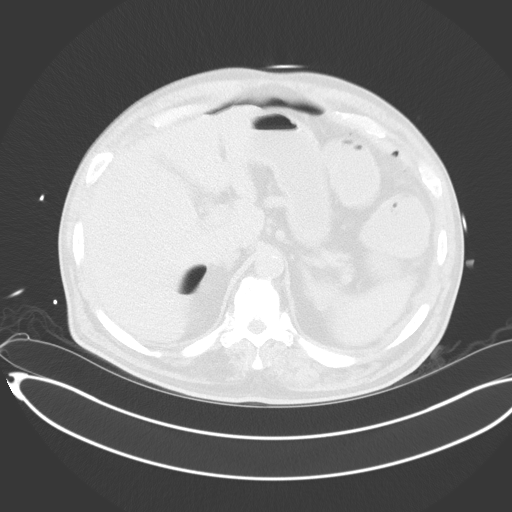
[im 88/128  lung]
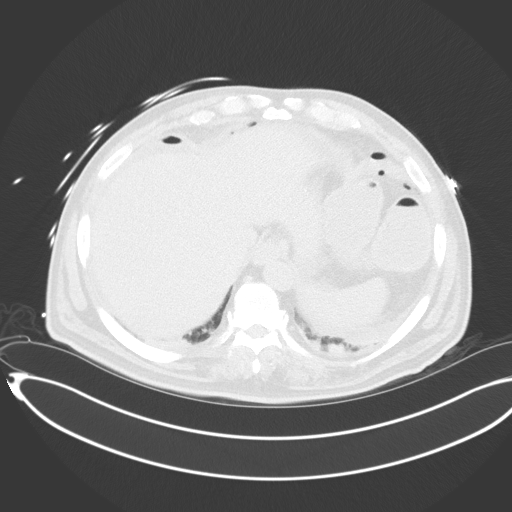
[im 108/128  lung]
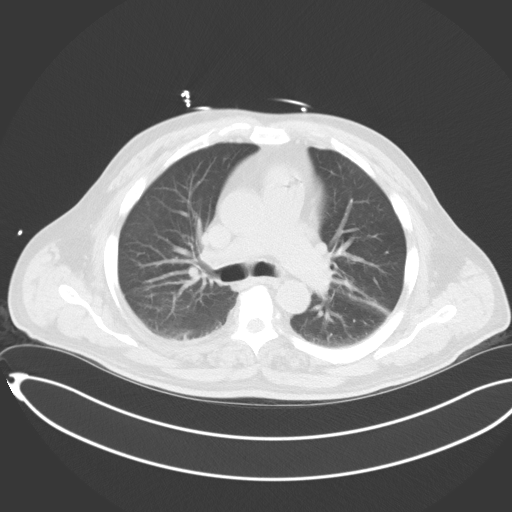
[im 118/128  mediastinal]
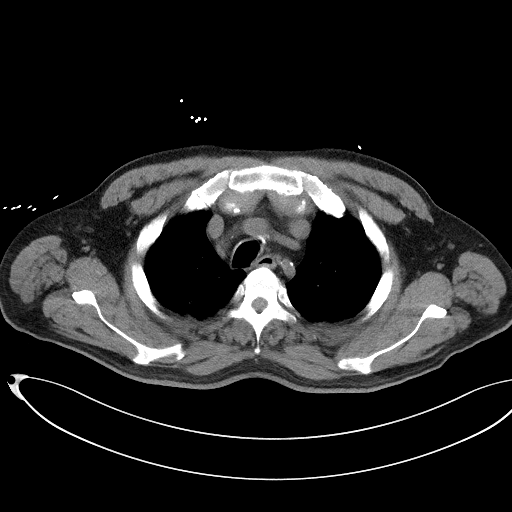
[im 118/128  lung]
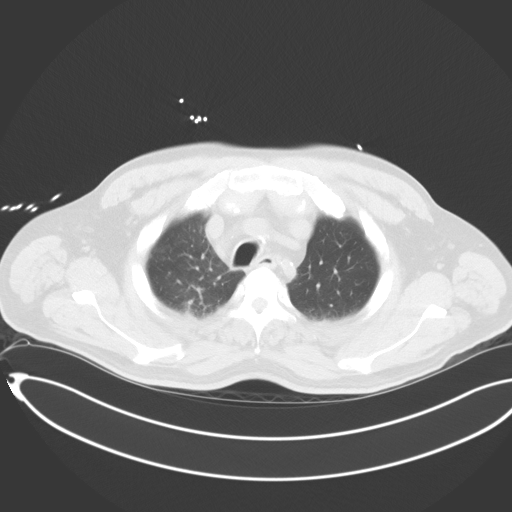

[Series 5: coronal · coronal · 0.82mm/px · 3 of 150 slices shown]
[im 30/150  lung]
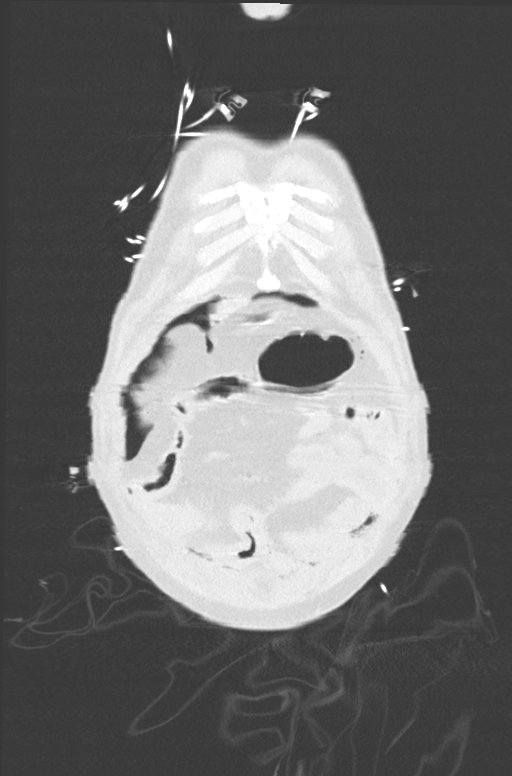
[im 60/150  lung]
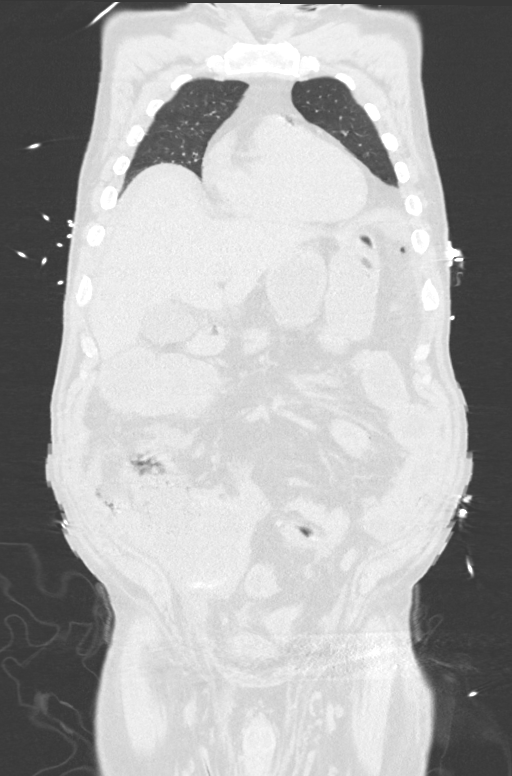
[im 90/150  lung]
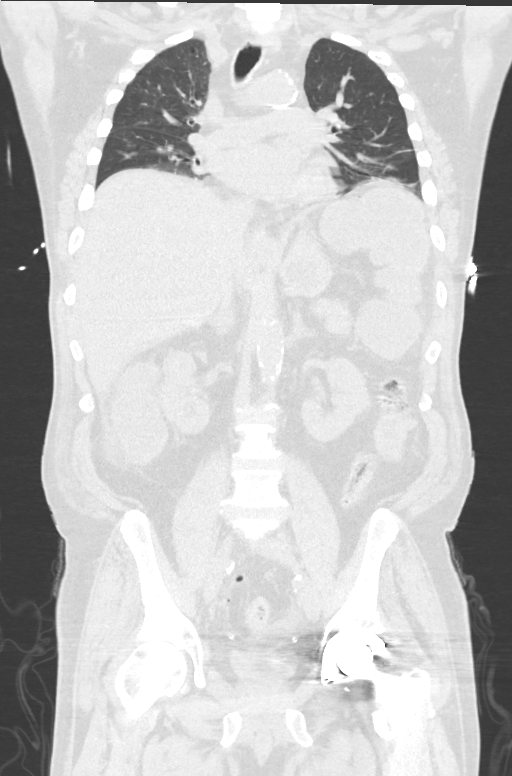

[12 of 36 positions shown; findings below may reference images not displayed]

FINDINGS: Comment: Today's study is limited for detection and characterization
of visceral and/or vascular lesions by lack of IV contrast material.

CT CHEST FINDINGS

Cardiovascular: Heart size is normal. There is no significant
pericardial fluid, thickening or pericardial calcification. Small
amount of gas is present in the right ventricle and pulmonic trunk.
There is aortic atherosclerosis, as well as atherosclerosis of the
great vessels of the mediastinum and the coronary arteries,
including calcified atherosclerotic plaque in the left anterior
descending coronary artery. No crescentic high attenuation
associated with the wall of the thoracic aorta to suggest acute
intramural hemorrhage. Small amount of gas also noted in the left
axillary vein.

Mediastinum/Nodes: No pathologically enlarged mediastinal or hilar
lymph nodes. Esophagus is unremarkable in appearance. No axillary
lymphadenopathy.

Lungs/Pleura: No suspicious appearing pulmonary nodules or masses
are noted. No acute consolidative airspace disease. No pleural
effusions. Linear areas of scarring and/or subsegmental atelectasis
are noted in the dependent portions of the lungs bilaterally.

Musculoskeletal: There are no aggressive appearing lytic or blastic
lesions noted in the visualized portions of the skeleton.

CT ABDOMEN PELVIS FINDINGS

Hepatobiliary: Mild diffuse low attenuation throughout the hepatic
parenchyma, indicative of hepatic steatosis. No definite discrete
cystic or solid hepatic lesions are confidently identified on
today's noncontrast CT examination. Unenhanced appearance of the
gallbladder is normal.

Pancreas: No definite pancreatic mass or peripancreatic fluid
collections or inflammatory changes are noted on today's noncontrast
CT examination.

Spleen: Unremarkable.

Adrenals/Urinary Tract: 1.7 cm low-attenuation lesion in the medial
aspect of the lower pole of the left kidney, incompletely
characterized on today's non-contrast CT examination, but
statistically likely to represent a cyst. Right kidney and bilateral
adrenal glands are normal in appearance. No hydroureteronephrosis.
Urinary bladder is normal in appearance.

Stomach/Bowel: Normal appearance of the stomach. Multiple prominent
borderline dilated loops of small bowel in the left side of the
abdomen measuring up to 3.1 cm in diameter with some subjective wall
thickening in the region of the jejunum and small air-fluid levels.
Moderate distension of the colon with fluid and gas. Potential
pneumatosis in the region of the cecum best appreciated on axial
image 82 of series 508 and coronal image 65 of series 511. Appendix
appears dilated measuring up to 1.4 cm in diameter.

Vascular/Lymphatic: Aortic atherosclerosis. No lymphadenopathy noted
in the abdomen or pelvis.

Reproductive: Prostate gland and seminal vesicles are unremarkable
in appearance.

Other: Large volume of pneumoperitoneum. In addition, there are some
unusual sites of extraluminal gas most notably in the low anatomic
pelvis encircling the mesorectum, and in the spaces adjacent to the
seminal vesicles, some of which may potentially be within venous
structures. Small volume of ascites.

Musculoskeletal: There are no aggressive appearing lytic or blastic
lesions noted in the visualized portions of the skeleton.
IMPRESSION: 1. Large amount of pneumoperitoneum indicative of bowel perforation.
The exact site of perforation is uncertain on today's examination,
however, the favored candidate sites for perforation include the
cecum where there is potential pneumatosis, and the rectosigmoid
region where there appears to be a small amount of gas in the veins
associated with the seminal vesicles and a small amount of gas
encircling the mesorectum. Surgical consultation is strongly
recommended.
2. Gas right ventricle and pulmonic trunk, as well as the left
axillary vein. Findings are favored to be iatrogenic related to IV
access. However, there may be some venous gas in the low anatomic
pelvis, as discussed above. The possibility of ischemic bowel should
be considered, and correlation with lactate levels is recommended.
3. Appendix is dilated measuring up to 14 mm in diameter. This is
presumably secondary to reactive inflammation, however, attention at
time of forthcoming exploratory laparotomy is recommended.
4. No definitive findings to suggest aortic dissection on today's
noncontrast CT examination.
5. Hepatic steatosis.
6. Additional incidental findings, as above.

Critical Value/emergent results were called by telephone at the time
of interpretation on 03/14/2020 at [DATE] to provider PAVEL A CIZKOVA, who
verbally acknowledged these results.

## 2021-07-24 IMAGING — DX DG CHEST 1V PORT
1 series · 1 of 1 positions shown · non-contrast
Comparison: No pertinent prior exams are available for comparison.

CLINICAL DATA: Questionable sepsis, abdominal pain, evaluate for
free air.

EXAM:
PORTABLE CHEST 1 VIEW

[chest ap]
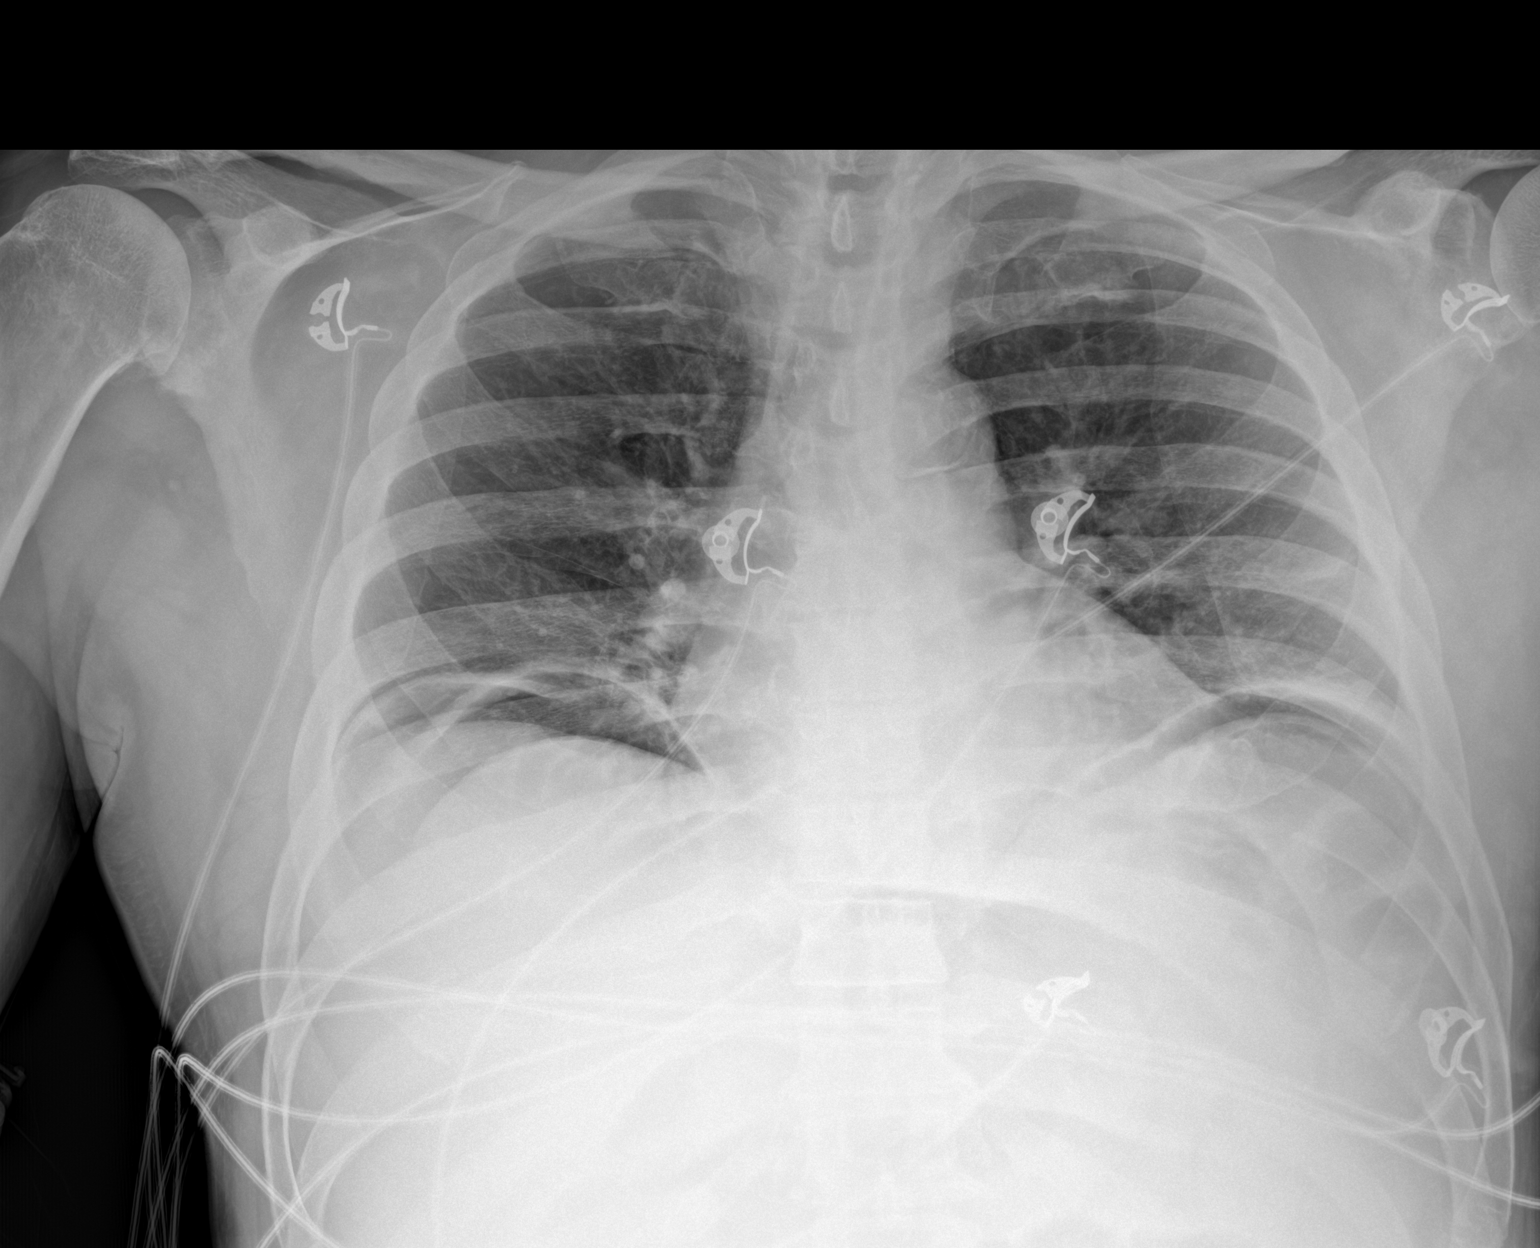

[1 of 1 positions shown; findings below may reference images not displayed]

FINDINGS: Heart size within normal limits. Aortic atherosclerosis. There is
free intraperitoneal air beneath the diaphragm bilaterally.
Elevation of the hemidiaphragms bilaterally with minimal bibasilar
atelectasis. The lungs are otherwise clear. No evidence of pleural
effusion or pneumothorax. No acute bony abnormality identified.

These results were called by telephone at the time of interpretation
on 03/14/2020 at [DATE] to provider GRZESIE JANIAK , who verbally
acknowledged these results.
IMPRESSION: Intraperitoneal free air beneath both hemidiaphragms concerning for
perforated viscus.

Elevation of the hemidiaphragms bilaterally with minimal bibasilar
atelectasis. The lungs are otherwise clear.

Aortic Atherosclerosis ([id number]).

## 2021-07-28 IMAGING — DX DG ABDOMEN 1V
1 series · 1 of 1 positions shown · non-contrast
Comparison: None.

CLINICAL DATA: OG tube placement

EXAM:
ABDOMEN - 1 VIEW

[abdomen supine]
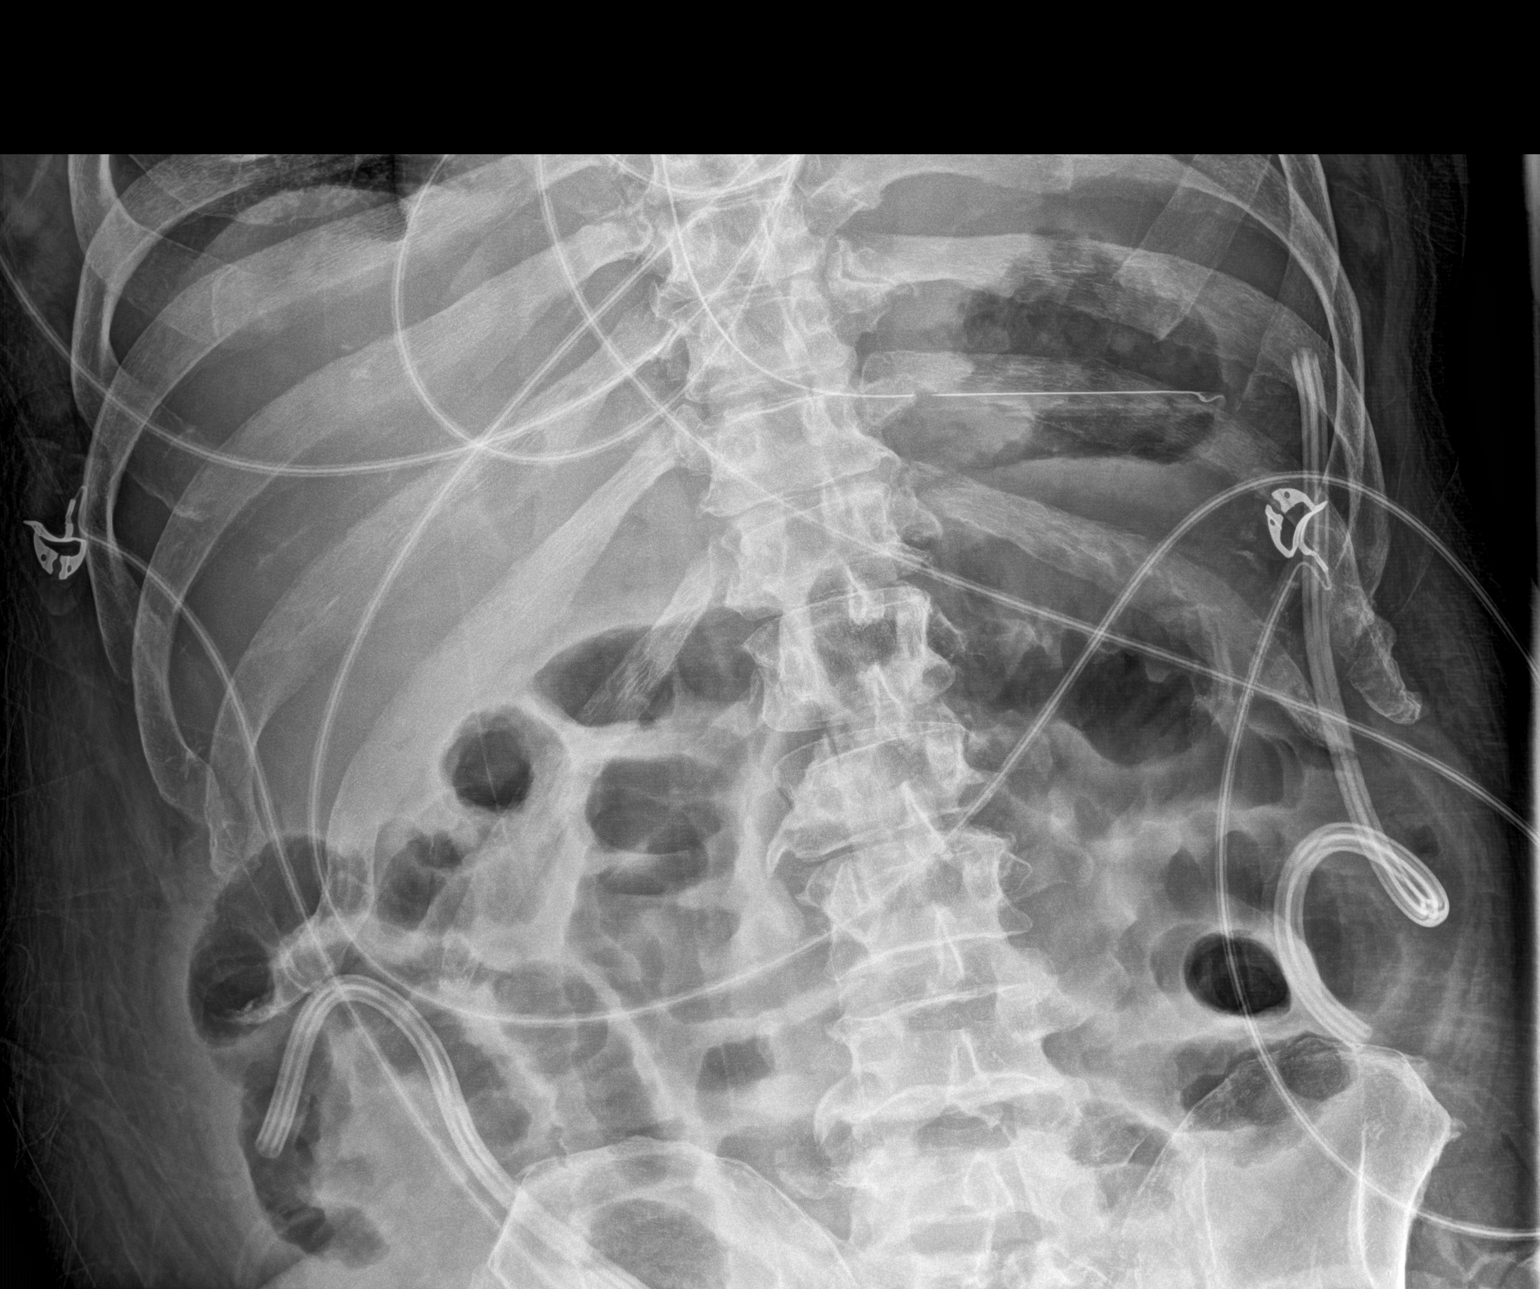

[1 of 1 positions shown; findings below may reference images not displayed]

FINDINGS: OG tube appears adequately positioned in the stomach. Presumed
drainage catheters within the LEFT and RIGHT abdomen. Overall
nonobstructive bowel gas pattern.
IMPRESSION: OG tube adequately positioned in the stomach.

## 2021-07-31 IMAGING — DX DG CHEST 1V PORT
1 series · 1 of 1 positions shown · non-contrast
Comparison: March 14, 2020.

CLINICAL DATA: Shortness of breath.

EXAM:
PORTABLE CHEST 1 VIEW

[chest ap]
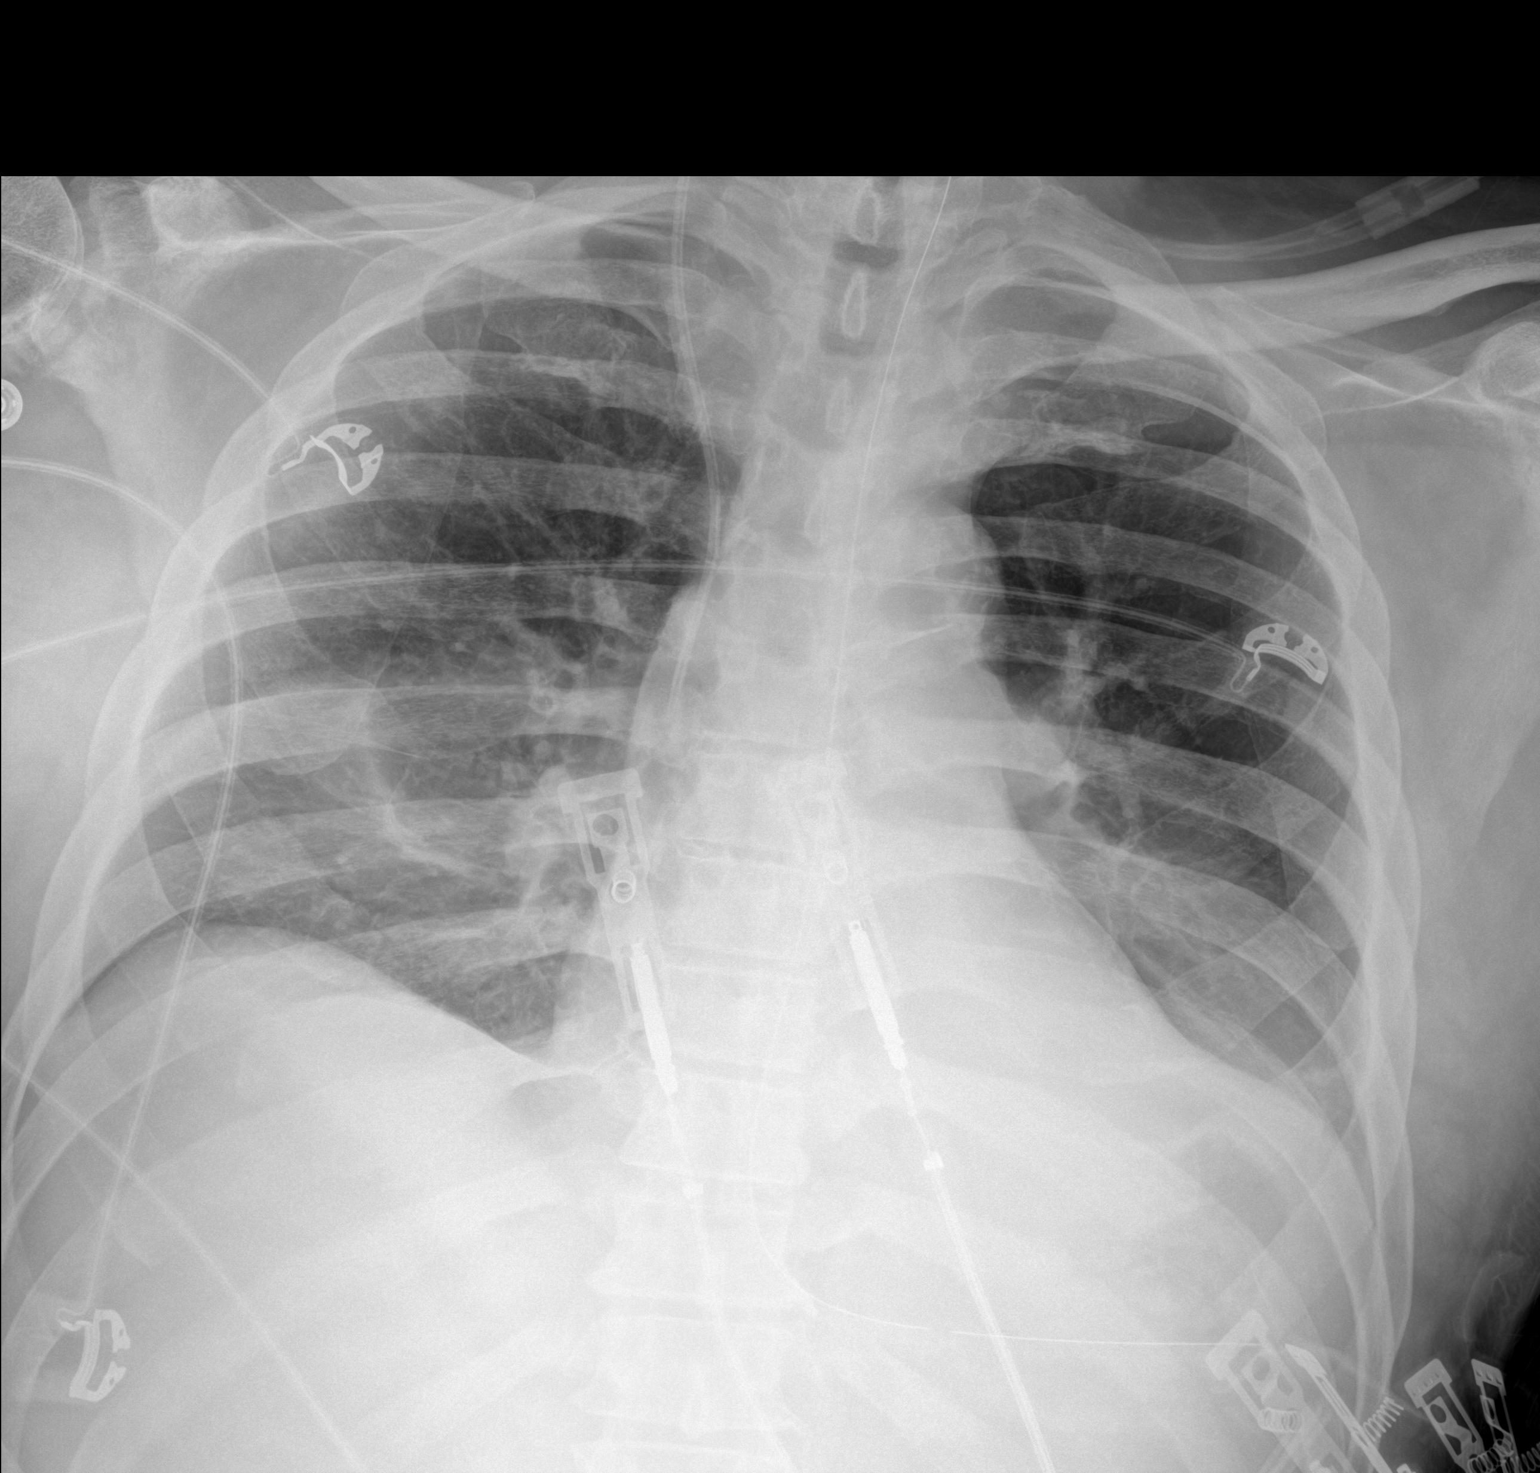

[1 of 1 positions shown; findings below may reference images not displayed]

FINDINGS: Stable cardiomediastinal silhouette. Nasogastric tube tip is seen in
proximal stomach. Endotracheal tube has been removed. Right internal
jugular catheter is unchanged in position. No pneumothorax is noted.
Mild bibasilar subsegmental atelectasis is noted. Small left pleural
effusion may be present. Bony thorax is unremarkable.
IMPRESSION: Mild bibasilar subsegmental atelectasis. Small left pleural effusion
may be present. Endotracheal tube has been removed.
